# Patient Record
Sex: Female | Born: 1995 | Race: White | Hispanic: No | Marital: Single | Smoking: Never smoker
Health system: Southern US, Community
[De-identification: ages and names within clinical notes are randomized; demographics above are authoritative.]

## PROBLEM LIST (undated history)

## (undated) DIAGNOSIS — R011 Cardiac murmur, unspecified: Secondary | ICD-10-CM

## (undated) DIAGNOSIS — L0591 Pilonidal cyst without abscess: Secondary | ICD-10-CM

## (undated) DIAGNOSIS — R109 Unspecified abdominal pain: Secondary | ICD-10-CM

## (undated) DIAGNOSIS — G8929 Other chronic pain: Secondary | ICD-10-CM

## (undated) DIAGNOSIS — N938 Other specified abnormal uterine and vaginal bleeding: Secondary | ICD-10-CM

## (undated) DIAGNOSIS — D649 Anemia, unspecified: Secondary | ICD-10-CM

## (undated) HISTORY — PX: WISDOM TOOTH EXTRACTION: SHX21

## (undated) HISTORY — PX: APPENDECTOMY: SHX54

## (undated) HISTORY — PX: CHOLECYSTECTOMY: SHX55

## (undated) HISTORY — DX: Pilonidal cyst without abscess: L05.91

---
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
## 2016-01-21 ENCOUNTER — Ambulatory Visit (HOSPITAL_COMMUNITY)
Admission: RE | Admit: 2016-01-21 | Discharge: 2016-01-21 | Disposition: A | Discharge status: Home / Self Care | Payer: Medicaid Other | Source: Ambulatory Visit | Attending: Family Medicine | Admitting: Family Medicine

## 2016-01-21 ENCOUNTER — Other Ambulatory Visit (HOSPITAL_COMMUNITY): Payer: Self-pay | Admitting: Family Medicine

## 2016-01-21 DIAGNOSIS — M5410 Radiculopathy, site unspecified: Secondary | ICD-10-CM | POA: Diagnosis not present

## 2016-01-21 DIAGNOSIS — Z68.41 Body mass index (BMI) pediatric, less than 5th percentile for age: Secondary | ICD-10-CM | POA: Insufficient documentation

## 2016-01-21 DIAGNOSIS — Z1389 Encounter for screening for other disorder: Secondary | ICD-10-CM | POA: Diagnosis not present

## 2016-01-21 DIAGNOSIS — M545 Low back pain: Secondary | ICD-10-CM

## 2016-01-31 ENCOUNTER — Other Ambulatory Visit: Payer: Self-pay | Admitting: Obstetrics and Gynecology

## 2016-01-31 DIAGNOSIS — O3680X0 Pregnancy with inconclusive fetal viability, not applicable or unspecified: Secondary | ICD-10-CM

## 2016-02-04 ENCOUNTER — Other Ambulatory Visit: Payer: Medicaid Other

## 2016-02-04 ENCOUNTER — Ambulatory Visit (INDEPENDENT_AMBULATORY_CARE_PROVIDER_SITE_OTHER): Payer: Medicaid Other

## 2016-02-04 DIAGNOSIS — Z3A08 8 weeks gestation of pregnancy: Secondary | ICD-10-CM

## 2016-02-04 DIAGNOSIS — O3680X0 Pregnancy with inconclusive fetal viability, not applicable or unspecified: Secondary | ICD-10-CM

## 2016-02-04 NOTE — Progress Notes (Unsigned)
[place] 7+2wks,single IUP w/ys,pos FHT 128 bpm,normal ov's bilat,crl 9.35mm

## 2016-02-14 ENCOUNTER — Encounter: Payer: Self-pay | Admitting: Advanced Practice Midwife

## 2016-02-14 ENCOUNTER — Ambulatory Visit (INDEPENDENT_AMBULATORY_CARE_PROVIDER_SITE_OTHER): Payer: Medicaid Other | Admitting: Advanced Practice Midwife

## 2016-02-14 VITALS — BP 120/70 | HR 104 | Wt 199.0 lb

## 2016-02-14 DIAGNOSIS — Z3491 Encounter for supervision of normal pregnancy, unspecified, first trimester: Secondary | ICD-10-CM | POA: Diagnosis not present

## 2016-02-14 DIAGNOSIS — Z1389 Encounter for screening for other disorder: Secondary | ICD-10-CM

## 2016-02-14 DIAGNOSIS — Z3400 Encounter for supervision of normal first pregnancy, unspecified trimester: Secondary | ICD-10-CM | POA: Insufficient documentation

## 2016-02-14 DIAGNOSIS — Z331 Pregnant state, incidental: Secondary | ICD-10-CM

## 2016-02-14 DIAGNOSIS — Z369 Encounter for antenatal screening, unspecified: Secondary | ICD-10-CM

## 2016-02-14 DIAGNOSIS — Z3682 Encounter for antenatal screening for nuchal translucency: Secondary | ICD-10-CM

## 2016-02-14 LAB — POCT URINALYSIS DIPSTICK
Blood, UA: NEGATIVE
Glucose, UA: NEGATIVE
Ketones, UA: NEGATIVE
Leukocytes, UA: NEGATIVE
NITRITE UA: NEGATIVE
Protein, UA: NEGATIVE

## 2016-02-14 NOTE — Progress Notes (Signed)
Pt states that she has noticed some lite spotting here and there. Pt given CCNC form and lab consents to read over and sign.

## 2016-02-14 NOTE — Patient Instructions (Signed)
 First Trimester of Pregnancy The first trimester of pregnancy is from week 1 until the end of week 12 (months 1 through 3). A week after a sperm fertilizes an egg, the egg will implant on the wall of the uterus. This embryo will begin to develop into a baby. Genes from you and your partner are forming the baby. The female genes determine whether the baby is a boy or a girl. At 6-8 weeks, the eyes and face are formed, and the heartbeat can be seen on ultrasound. At the end of 12 weeks, all the baby's organs are formed.  Now that you are pregnant, you will want to do everything you can to have a healthy baby. Two of the most important things are to get good prenatal care and to follow your health care provider's instructions. Prenatal care is all the medical care you receive before the baby's birth. This care will help prevent, find, and treat any problems during the pregnancy and childbirth. BODY CHANGES Your body goes through many changes during pregnancy. The changes vary from woman to woman.   You may gain or lose a couple of pounds at first.  You may feel sick to your stomach (nauseous) and throw up (vomit). If the vomiting is uncontrollable, call your health care provider.  You may tire easily.  You may develop headaches that can be relieved by medicines approved by your health care provider.  You may urinate more often. Painful urination may mean you have a bladder infection.  You may develop heartburn as a result of your pregnancy.  You may develop constipation because certain hormones are causing the muscles that push waste through your intestines to slow down.  You may develop hemorrhoids or swollen, bulging veins (varicose veins).  Your breasts may begin to grow larger and become tender. Your nipples may stick out more, and the tissue that surrounds them (areola) may become darker.  Your gums may bleed and may be sensitive to brushing and flossing.  Dark spots or blotches  (chloasma, mask of pregnancy) may develop on your face. This will likely fade after the baby is born.  Your menstrual periods will stop.  You may have a loss of appetite.  You may develop cravings for certain kinds of food.  You may have changes in your emotions from day to day, such as being excited to be pregnant or being concerned that something may go wrong with the pregnancy and baby.  You may have more vivid and strange dreams.  You may have changes in your hair. These can include thickening of your hair, rapid growth, and changes in texture. Some women also have hair loss during or after pregnancy, or hair that feels dry or thin. Your hair will most likely return to normal after your baby is born. WHAT TO EXPECT AT YOUR PRENATAL VISITS During a routine prenatal visit:  You will be weighed to make sure you and the baby are growing normally.  Your blood pressure will be taken.  Your abdomen will be measured to track your baby's growth.  The fetal heartbeat will be listened to starting around week 10 or 12 of your pregnancy.  Test results from any previous visits will be discussed. Your health care provider may ask you:  How you are feeling.  If you are feeling the baby move.  If you have had any abnormal symptoms, such as leaking fluid, bleeding, severe headaches, or abdominal cramping.  If you have any questions. Other   tests that may be performed during your first trimester include:  Blood tests to find your blood type and to check for the presence of any previous infections. They will also be used to check for low iron levels (anemia) and Rh antibodies. Later in the pregnancy, blood tests for diabetes will be done along with other tests if problems develop.  Urine tests to check for infections, diabetes, or protein in the urine.  An ultrasound to confirm the proper growth and development of the baby.  An amniocentesis to check for possible genetic problems.  Fetal  screens for spina bifida and Down syndrome.  You may need other tests to make sure you and the baby are doing well. HOME CARE INSTRUCTIONS  Medicines  Follow your health care provider's instructions regarding medicine use. Specific medicines may be either safe or unsafe to take during pregnancy.  Take your prenatal vitamins as directed.  If you develop constipation, try taking a stool softener if your health care provider approves. Diet  Eat regular, well-balanced meals. Choose a variety of foods, such as meat or vegetable-based protein, fish, milk and low-fat dairy products, vegetables, fruits, and whole grain breads and cereals. Your health care provider will help you determine the amount of weight gain that is right for you.  Avoid raw meat and uncooked cheese. These carry germs that can cause birth defects in the baby.  Eating four or five small meals rather than three large meals a day may help relieve nausea and vomiting. If you start to feel nauseous, eating a few soda crackers can be helpful. Drinking liquids between meals instead of during meals also seems to help nausea and vomiting.  If you develop constipation, eat more high-fiber foods, such as fresh vegetables or fruit and whole grains. Drink enough fluids to keep your urine clear or pale yellow. Activity and Exercise  Exercise only as directed by your health care provider. Exercising will help you:  Control your weight.  Stay in shape.  Be prepared for labor and delivery.  Experiencing pain or cramping in the lower abdomen or low back is a good sign that you should stop exercising. Check with your health care provider before continuing normal exercises.  Try to avoid standing for long periods of time. Move your legs often if you must stand in one place for a long time.  Avoid heavy lifting.  Wear low-heeled shoes, and practice good posture.  You may continue to have sex unless your health care provider directs you  otherwise. Relief of Pain or Discomfort  Wear a good support bra for breast tenderness.   Take warm sitz baths to soothe any pain or discomfort caused by hemorrhoids. Use hemorrhoid cream if your health care provider approves.   Rest with your legs elevated if you have leg cramps or low back pain.  If you develop varicose veins in your legs, wear support hose. Elevate your feet for 15 minutes, 3-4 times a day. Limit salt in your diet. Prenatal Care  Schedule your prenatal visits by the twelfth week of pregnancy. They are usually scheduled monthly at first, then more often in the last 2 months before delivery.  Write down your questions. Take them to your prenatal visits.  Keep all your prenatal visits as directed by your health care provider. Safety  Wear your seat belt at all times when driving.  Make a list of emergency phone numbers, including numbers for family, friends, the hospital, and police and fire departments. General   Tips  Ask your health care provider for a referral to a local prenatal education class. Begin classes no later than at the beginning of month 6 of your pregnancy.  Ask for help if you have counseling or nutritional needs during pregnancy. Your health care provider can offer advice or refer you to specialists for help with various needs.  Do not use hot tubs, steam rooms, or saunas.  Do not douche or use tampons or scented sanitary pads.  Do not cross your legs for long periods of time.  Avoid cat litter boxes and soil used by cats. These carry germs that can cause birth defects in the baby and possibly loss of the fetus by miscarriage or stillbirth.  Avoid all smoking, herbs, alcohol, and medicines not prescribed by your health care provider. Chemicals in these affect the formation and growth of the baby.  Schedule a dentist appointment. At home, brush your teeth with a soft toothbrush and be gentle when you floss. SEEK MEDICAL CARE IF:   You have  dizziness.  You have mild pelvic cramps, pelvic pressure, or nagging pain in the abdominal area.  You have persistent nausea, vomiting, or diarrhea.  You have a bad smelling vaginal discharge.  You have pain with urination.  You notice increased swelling in your face, hands, legs, or ankles. SEEK IMMEDIATE MEDICAL CARE IF:   You have a fever.  You are leaking fluid from your vagina.  You have spotting or bleeding from your vagina.  You have severe abdominal cramping or pain.  You have rapid weight gain or loss.  You vomit blood or material that looks like coffee grounds.  You are exposed to German measles and have never had them.  You are exposed to fifth disease or chickenpox.  You develop a severe headache.  You have shortness of breath.  You have any kind of trauma, such as from a fall or a car accident. Document Released: 11/18/2001 Document Revised: 04/10/2014 Document Reviewed: 10/04/2013 [employer] Patient Information 2015 [employer]. This information is not intended to replace advice given to you by your health care provider. Make sure you discuss any questions you have with your health care provider.   Nausea & Vomiting  Have saltine crackers or pretzels by your bed and eat a few bites before you raise your head out of bed in the morning  Eat small frequent meals throughout the day instead of large meals  Drink plenty of fluids throughout the day to stay hydrated, just don't drink a lot of fluids with your meals.  This can make your stomach fill up faster making you feel sick  Do not brush your teeth right after you eat  Products with real ginger are good for nausea, like ginger ale and ginger hard candy Make sure it says made with real ginger!  Sucking on sour candy like lemon heads is also good for nausea  If your prenatal vitamins make you nauseated, take them at night so you will sleep through the nausea  Sea Bands  If you feel like you need  medicine for the nausea & vomiting please let us know  If you are unable to keep any fluids or food down please let us know   Constipation  Drink plenty of fluid, preferably water, throughout the day  Eat foods high in fiber such as fruits, vegetables, and grains  Exercise, such as walking, is a good way to keep your bowels regular  Drink warm fluids, especially warm   prune juice, or decaf coffee  Eat a 1/2 cup of real oatmeal (not instant), 1/2 cup applesauce, and 1/2-1 cup warm prune juice every day  If needed, you may take Colace (docusate sodium) stool softener once or twice a day to help keep the stool soft. If you are pregnant, wait until you are out of your first trimester (12-14 weeks of pregnancy)  If you still are having problems with constipation, you may take Miralax once daily as needed to help keep your bowels regular.  If you are pregnant, wait until you are out of your first trimester (12-14 weeks of pregnancy)  Safe Medications in Pregnancy   Acne: Benzoyl Peroxide Salicylic Acid  Backache/Headache: Tylenol: 2 regular strength every 4 hours OR              2 Extra strength every 6 hours  Colds/Coughs/Allergies: Benadryl (alcohol free) 25 mg every 6 hours as needed Breath right strips Claritin Cepacol throat lozenges Chloraseptic throat spray Cold-Eeze- up to three times per day Cough drops, alcohol free Flonase (by prescription only) Guaifenesin Mucinex Robitussin DM (plain only, alcohol free) Saline nasal spray/drops Sudafed (pseudoephedrine) & Actifed ** use only after [redacted] weeks gestation and if you do not have high blood pressure Tylenol Vicks Vaporub Zinc lozenges Zyrtec   Constipation: Colace Ducolax suppositories Fleet enema Glycerin suppositories Metamucil Milk of magnesia Miralax Senokot Smooth move tea  Diarrhea: Kaopectate Imodium A-D  *NO pepto Bismol  Hemorrhoids: Anusol Anusol HC Preparation  H Tucks  Indigestion: Tums Maalox Mylanta Zantac  Pepcid  Insomnia: Benadryl (alcohol free) 25mg every 6 hours as needed Tylenol PM Unisom, no Gelcaps  Leg Cramps: Tums MagGel  Nausea/Vomiting:  Bonine Dramamine Emetrol Ginger extract Sea bands Meclizine  Nausea medication to take during pregnancy:  Unisom (doxylamine succinate 25 mg tablets) Take one tablet daily at bedtime. If symptoms are not adequately controlled, the dose can be increased to a maximum recommended dose of two tablets daily (1/2 tablet in the morning, 1/2 tablet mid-afternoon and one at bedtime). Vitamin B6 100mg tablets. Take one tablet twice a day (up to 200 mg per day).  Skin Rashes: Aveeno products Benadryl cream or 25mg every 6 hours as needed Calamine Lotion 1% cortisone cream  Yeast infection: Gyne-lotrimin 7 Monistat 7   **If taking multiple medications, please check labels to avoid duplicating the same active ingredients **take medication as directed on the label ** Do not exceed 4000 mg of tylenol in 24 hours **Do not take medications that contain aspirin or ibuprofen      

## 2016-02-14 NOTE — Progress Notes (Signed)
  Subjective:    Bailey Palmer is a G1P0 327w5d being seen today for her first obstetrical visit.  Her obstetrical history is significant for first pregnancy.  Pregnancy history fully reviewed.  Patient reports nausea.  Filed Vitals:   02/14/16 1519  BP: 120/70  Pulse: 104  Weight: 199 lb (90.266 kg)    HISTORY: OB History  Gravida Para Term Preterm AB SAB TAB Ectopic Multiple Living  1             # Outcome Date GA Lbr Len/2nd Weight Sex Delivery Anes PTL Lv  1 Current              Past Medical History  Diagnosis Date  . DUB (dysfunctional uterine bleeding)   . Chronic abdominal pain    Past Surgical History  Procedure Laterality Date  . Appendectomy    . Cholecystectomy     Family History  Problem Relation Age of Onset  . ADD / ADHD Brother   . Diabetes Maternal Grandmother   . Hypertension Maternal Grandmother      Exam                                      System:     Skin: normal coloration and turgor, no rashes    Neurologic: oriented, normal, normal mood   Extremities: normal strength, tone, and muscle mass   HEENT PERRLA   Mouth/Teeth mucous membranes moist, normal dentition   Neck supple and no masses   Cardiovascular: regular rate and rhythm   Respiratory:  appears well, vitals normal, no respiratory distress, acyanotic   Abdomen: soft, non-tender;  FHR: 160 US          Assessment:    Pregnancy: G1P0 Patient Active Problem List   Diagnosis Date Noted  . Supervision of normal pregnancy 02/14/2016        Plan:     Initial labs drawn. Continue prenatal vitamins  Problem list reviewed and updated  Reviewed n/v relief measures and warning s/s to report  Reviewed recommended weight gain based on pre-gravid BMI  Encouraged well-balanced diet Genetic Screening discussed Integrated Screen: requested.  Ultrasound discussed; fetal survey: requested.  Return in about 4 weeks (around 03/13/2016) for LROB, US:NT+1st  IT.  CRESENZO-DISHMAN,Oceanna Arruda 02/14/2016

## 2016-02-16 LAB — GC/CHLAMYDIA PROBE AMP
CHLAMYDIA, DNA PROBE: POSITIVE — AB
Neisseria gonorrhoeae by PCR: NEGATIVE

## 2016-02-16 LAB — URINE CULTURE

## 2016-02-19 LAB — PMP SCREEN PROFILE (10S), URINE
Amphetamine Screen, Ur: NEGATIVE ng/mL
BARBITURATE SCRN UR: NEGATIVE ng/mL
Benzodiazepine Screen, Urine: NEGATIVE ng/mL
CANNABINOIDS UR QL SCN: NEGATIVE ng/mL
Cocaine(Metab.)Screen, Urine: NEGATIVE ng/mL
Creatinine(Crt), U: 151 mg/dL (ref 20.0–300.0)
Methadone Scn, Ur: NEGATIVE ng/mL
Opiate Scrn, Ur: NEGATIVE ng/mL
Oxycodone+Oxymorphone Ur Ql Scn: NEGATIVE ng/mL
PCP Scrn, Ur: NEGATIVE ng/mL
PH UR, DRUG SCRN: 6.6 (ref 4.5–8.9)
Propoxyphene, Screen: NEGATIVE ng/mL

## 2016-02-19 LAB — ANTIBODY SCREEN: ANTIBODY SCREEN: NEGATIVE

## 2016-02-19 LAB — URINALYSIS, ROUTINE W REFLEX MICROSCOPIC
Bilirubin, UA: NEGATIVE
Glucose, UA: NEGATIVE
Ketones, UA: NEGATIVE
Leukocytes, UA: NEGATIVE
NITRITE UA: NEGATIVE
PH UA: 6.5 (ref 5.0–7.5)
RBC UA: NEGATIVE
Specific Gravity, UA: >=1.03 — AB (ref 1.005–1.030)
UUROB: 2 mg/dL — AB (ref 0.2–1.0)

## 2016-02-19 LAB — CBC
HEMOGLOBIN: 11.8 g/dL (ref 11.1–15.9)
Hematocrit: 35.1 % (ref 34.0–46.6)
MCH: 28.2 pg (ref 26.6–33.0)
MCHC: 33.6 g/dL (ref 31.5–35.7)
MCV: 84 fL (ref 79–97)
Platelets: 317 10*3/uL (ref 150–379)
RBC: 4.18 x10E6/uL (ref 3.77–5.28)
RDW: 14.1 % (ref 12.3–15.4)
WBC: 9.7 10*3/uL (ref 3.4–10.8)

## 2016-02-19 LAB — ABO/RH: Rh Factor: POSITIVE

## 2016-02-19 LAB — VARICELLA ZOSTER ANTIBODY, IGG: VARICELLA: 377 {index} (ref >165)

## 2016-02-19 LAB — CYSTIC FIBROSIS MUTATION 97: Interpretation: NOT DETECTED

## 2016-02-19 LAB — HIV ANTIBODY (ROUTINE TESTING W REFLEX): HIV Screen 4th Generation wRfx: NONREACTIVE

## 2016-02-19 LAB — HEPATITIS B SURFACE ANTIGEN: Hepatitis B Surface Ag: NEGATIVE

## 2016-02-19 LAB — RPR: RPR Ser Ql: NONREACTIVE

## 2016-02-19 LAB — RUBELLA SCREEN: RUBELLA: 1.23 {index} (ref >0.99)

## 2016-02-19 LAB — SICKLE CELL SCREEN: Sickle Cell Screen: NEGATIVE

## 2016-02-20 ENCOUNTER — Other Ambulatory Visit: Payer: Self-pay | Admitting: Advanced Practice Midwife

## 2016-02-20 DIAGNOSIS — A749 Chlamydial infection, unspecified: Secondary | ICD-10-CM | POA: Insufficient documentation

## 2016-02-20 DIAGNOSIS — Z3491 Encounter for supervision of normal pregnancy, unspecified, first trimester: Secondary | ICD-10-CM

## 2016-02-20 MED ORDER — AZITHROMYCIN 500 MG PO TABS: 1000.0000 mg | ORAL_TABLET | Freq: Once | ORAL | Status: DC

## 2016-03-13 ENCOUNTER — Other Ambulatory Visit: Payer: Self-pay

## 2016-03-13 ENCOUNTER — Encounter: Payer: Self-pay | Admitting: Advanced Practice Midwife

## 2016-03-17 ENCOUNTER — Ambulatory Visit (INDEPENDENT_AMBULATORY_CARE_PROVIDER_SITE_OTHER): Payer: Medicaid Other | Admitting: Obstetrics and Gynecology

## 2016-03-17 ENCOUNTER — Encounter: Payer: Self-pay | Admitting: Obstetrics and Gynecology

## 2016-03-17 ENCOUNTER — Ambulatory Visit (INDEPENDENT_AMBULATORY_CARE_PROVIDER_SITE_OTHER): Payer: Medicaid Other

## 2016-03-17 VITALS — BP 120/72 | HR 96 | Wt 199.0 lb

## 2016-03-17 DIAGNOSIS — Z3401 Encounter for supervision of normal first pregnancy, first trimester: Secondary | ICD-10-CM

## 2016-03-17 DIAGNOSIS — Z3A14 14 weeks gestation of pregnancy: Secondary | ICD-10-CM | POA: Diagnosis not present

## 2016-03-17 DIAGNOSIS — Z1389 Encounter for screening for other disorder: Secondary | ICD-10-CM

## 2016-03-17 DIAGNOSIS — Z36 Encounter for antenatal screening of mother: Secondary | ICD-10-CM | POA: Diagnosis not present

## 2016-03-17 DIAGNOSIS — Z369 Encounter for antenatal screening, unspecified: Secondary | ICD-10-CM

## 2016-03-17 DIAGNOSIS — Z331 Pregnant state, incidental: Secondary | ICD-10-CM

## 2016-03-17 DIAGNOSIS — Z3491 Encounter for supervision of normal pregnancy, unspecified, first trimester: Secondary | ICD-10-CM

## 2016-03-17 DIAGNOSIS — A749 Chlamydial infection, unspecified: Secondary | ICD-10-CM

## 2016-03-17 DIAGNOSIS — Z3682 Encounter for antenatal screening for nuchal translucency: Secondary | ICD-10-CM

## 2016-03-17 DIAGNOSIS — O98311 Other infections with a predominantly sexual mode of transmission complicating pregnancy, first trimester: Secondary | ICD-10-CM

## 2016-03-17 LAB — POCT URINALYSIS DIPSTICK
Blood, UA: NEGATIVE
GLUCOSE UA: NEGATIVE
Ketones, UA: NEGATIVE
LEUKOCYTES UA: NEGATIVE
Nitrite, UA: NEGATIVE
Protein, UA: NEGATIVE

## 2016-03-17 NOTE — Progress Notes (Signed)
Pt denies any problems or concerns at this time.  

## 2016-03-17 NOTE — Progress Notes (Signed)
Patient ID: Bailey Palmer, female   DOB: [birth year], 20 y.o.   MRN: [id number] G1P0 348w2d Estimated Date of Delivery: 09/20/16  Blood pressure 120/72, pulse 96, weight 199 lb (90.266 kg), last menstrual period 12/15/2015.   refer to the ob flow sheet for FH and FHR, also BP, Wt, Urine results: negative  GC/Chlamydia Probe completed during visit. Also advised pt and partner to attend child birth classes.  FHR: 162  Patient reports  + good fetal movement, denies any bleeding and no rupture of membranes symptoms or regular contractions. Patient complaints: Pt has no complaints today.   Questions were answered. Assessment:  1. LROB G1P0 @ 518w2d  2. Proof of cure for chlamydia collected   Plan:   1. Continued routine obstetrical care 2. Child birth classes discussed 3. F/u in 4 weeks for routine OB care.  4 initial labs for IT testing today.   By signing my name below, I, Marica Otterusrat Rahman, attest that this documentation has been prepared under the direction and in the presence of Christin BachJohn Fredricka Kohrs, MD. Electronically Signed: Marica OtterNusrat Rahman, ED Scribe. 03/17/2016. 3:57 PM.   I personally performed the services described in this documentation, which was SCRIBED in my presence. The recorded information has been reviewed and considered accurate. It has been edited as necessary during review. Tilda BurrowFERGUSON,Kolbie Lepkowski V, MD

## 2016-03-17 NOTE — Progress Notes (Signed)
[place] 13+2 wks,crl 71.7 mm,normal ov's bilat,NB present,NT 1.746mm,fht 160 bpm

## 2016-03-18 LAB — GC/CHLAMYDIA PROBE AMP
Chlamydia trachomatis, NAA: NEGATIVE
NEISSERIA GONORRHOEAE BY PCR: NEGATIVE

## 2016-03-19 LAB — MATERNAL SCREEN, INTEGRATED #1
Crown Rump Length: 71.7 mm
GEST. AGE ON COLLECTION DATE: 13 wk
Maternal Age at EDD: 19.7 years
NUCHAL TRANSLUCENCY (NT): 1.6 mm
NUMBER OF FETUSES: 1
PAPP-A VALUE: 752.4 ng/mL
WEIGHT: 199 [lb_av]

## 2016-04-02 ENCOUNTER — Ambulatory Visit (INDEPENDENT_AMBULATORY_CARE_PROVIDER_SITE_OTHER): Payer: Medicaid Other | Admitting: Advanced Practice Midwife

## 2016-04-02 VITALS — BP 108/50 | HR 98 | Wt 197.0 lb

## 2016-04-02 DIAGNOSIS — Z1389 Encounter for screening for other disorder: Secondary | ICD-10-CM

## 2016-04-02 DIAGNOSIS — O2332 Infections of other parts of urinary tract in pregnancy, second trimester: Secondary | ICD-10-CM

## 2016-04-02 DIAGNOSIS — Z3492 Encounter for supervision of normal pregnancy, unspecified, second trimester: Secondary | ICD-10-CM

## 2016-04-02 DIAGNOSIS — Z3402 Encounter for supervision of normal first pregnancy, second trimester: Secondary | ICD-10-CM

## 2016-04-02 DIAGNOSIS — Z3A16 16 weeks gestation of pregnancy: Secondary | ICD-10-CM

## 2016-04-02 DIAGNOSIS — N390 Urinary tract infection, site not specified: Secondary | ICD-10-CM

## 2016-04-02 DIAGNOSIS — Z331 Pregnant state, incidental: Secondary | ICD-10-CM

## 2016-04-02 DIAGNOSIS — R319 Hematuria, unspecified: Secondary | ICD-10-CM

## 2016-04-02 DIAGNOSIS — A749 Chlamydial infection, unspecified: Secondary | ICD-10-CM

## 2016-04-02 LAB — POCT URINALYSIS DIPSTICK
Blood, UA: 3
GLUCOSE UA: NEGATIVE
Ketones, UA: NEGATIVE
Nitrite, UA: NEGATIVE
Protein, UA: 1

## 2016-04-02 MED ORDER — NITROFURANTOIN MONOHYD MACRO 100 MG PO CAPS: 100.0000 mg | ORAL_CAPSULE | Freq: Two times a day (BID) | ORAL | Status: AC

## 2016-04-02 NOTE — Progress Notes (Signed)
WORK IN FOR:  ? UTI:  C/o urinary frequency and dysuria since yesterday.   G1P0 7475w4d Estimated Date of Delivery: 09/20/16  Blood pressure 108/50, pulse 98, weight 197 lb (89.359 kg), last menstrual period 12/15/2015.   BP weight and urine results all reviewed and noted.  Please refer to the obstetrical flow sheet for the fundal height and fetal heart rate documentation:  Patient denies any bleeding and no rupture of membranes symptoms or regular contractions. No fever or CVAT All questions were answered.  Orders Placed This Encounter  Procedures  . Urine culture  . POCT urinalysis dipstick    Plan:  Continued routine obstetrical care, pyelo precautions given. Rx Macrobid 100mg  BID X7  Return for As scheduled.

## 2016-04-04 LAB — URINE CULTURE

## 2016-04-08 ENCOUNTER — Other Ambulatory Visit: Payer: Self-pay | Admitting: Advanced Practice Midwife

## 2016-04-14 ENCOUNTER — Encounter: Payer: Self-pay | Admitting: Women's Health

## 2016-04-14 ENCOUNTER — Ambulatory Visit (INDEPENDENT_AMBULATORY_CARE_PROVIDER_SITE_OTHER): Payer: Medicaid Other | Admitting: Women's Health

## 2016-04-14 VITALS — BP 122/58 | HR 88 | Wt 202.0 lb

## 2016-04-14 DIAGNOSIS — Z3402 Encounter for supervision of normal first pregnancy, second trimester: Secondary | ICD-10-CM

## 2016-04-14 DIAGNOSIS — Z363 Encounter for antenatal screening for malformations: Secondary | ICD-10-CM

## 2016-04-14 DIAGNOSIS — Z331 Pregnant state, incidental: Secondary | ICD-10-CM

## 2016-04-14 DIAGNOSIS — Z1389 Encounter for screening for other disorder: Secondary | ICD-10-CM

## 2016-04-14 DIAGNOSIS — Z3682 Encounter for antenatal screening for nuchal translucency: Secondary | ICD-10-CM

## 2016-04-14 LAB — POCT URINALYSIS DIPSTICK
Blood, UA: NEGATIVE
GLUCOSE UA: NEGATIVE
Ketones, UA: NEGATIVE
Leukocytes, UA: NEGATIVE
Nitrite, UA: NEGATIVE
Protein, UA: NEGATIVE

## 2016-04-14 NOTE — Progress Notes (Signed)
Low-risk OB appointment G1P0 5071w2d Estimated Date of Delivery: 09/20/16 BP 122/58 mmHg  Pulse 88  Wt 202 lb (91.627 kg)  LMP 12/15/2015 (Exact Date)  BP, weight, and urine reviewed.  Refer to obstetrical flow sheet for FH & FHR.  No fm yet. Denies cramping, lof, vb, or uti s/s. No complaints. Reviewed warning s/s to report. Plan:  Continue routine obstetrical care  F/U in 2wks for OB appointment and anatomy u/s 2nd IT today

## 2016-04-14 NOTE — Patient Instructions (Signed)

## 2016-04-16 LAB — MATERNAL SCREEN, INTEGRATED #2
ADSF: 1.12
AFP MoM: 1.13
Alpha-Fetoprotein: 32.2 ng/mL
Crown Rump Length: 71.7 mm
DIA MoM: 0.87
DIA VALUE: 127.7 pg/mL
ESTRIOL UNCONJUGATED: 1.04 ng/mL
GEST. AGE ON COLLECTION DATE: 13 wk
Gestational Age: 17 weeks
MATERNAL AGE AT EDD: 19.7 a
NUCHAL TRANSLUCENCY (NT): 1.6 mm
NUCHAL TRANSLUCENCY MOM: 0.91
Number of Fetuses: 1
PAPP-A MoM: 0.93
PAPP-A VALUE: 752.4 ng/mL
Test Results:: NEGATIVE
WEIGHT: 199 [lb_av]
Weight: 199 [lb_av]
hCG MoM: 1.23
hCG Value: 29.6 IU/mL

## 2016-04-28 ENCOUNTER — Ambulatory Visit (INDEPENDENT_AMBULATORY_CARE_PROVIDER_SITE_OTHER): Payer: Medicaid Other | Admitting: Women's Health

## 2016-04-28 ENCOUNTER — Encounter: Payer: Self-pay | Admitting: Women's Health

## 2016-04-28 ENCOUNTER — Ambulatory Visit (INDEPENDENT_AMBULATORY_CARE_PROVIDER_SITE_OTHER): Payer: Medicaid Other

## 2016-04-28 VITALS — BP 128/70 | HR 96 | Wt 201.0 lb

## 2016-04-28 DIAGNOSIS — Z3A20 20 weeks gestation of pregnancy: Secondary | ICD-10-CM

## 2016-04-28 DIAGNOSIS — Z363 Encounter for antenatal screening for malformations: Secondary | ICD-10-CM

## 2016-04-28 DIAGNOSIS — Z1389 Encounter for screening for other disorder: Secondary | ICD-10-CM

## 2016-04-28 DIAGNOSIS — Z3402 Encounter for supervision of normal first pregnancy, second trimester: Secondary | ICD-10-CM

## 2016-04-28 DIAGNOSIS — Z36 Encounter for antenatal screening of mother: Secondary | ICD-10-CM | POA: Diagnosis not present

## 2016-04-28 DIAGNOSIS — Z331 Pregnant state, incidental: Secondary | ICD-10-CM

## 2016-04-28 DIAGNOSIS — A749 Chlamydial infection, unspecified: Secondary | ICD-10-CM

## 2016-04-28 LAB — POCT URINALYSIS DIPSTICK
Blood, UA: NEGATIVE
KETONES UA: NEGATIVE
Leukocytes, UA: NEGATIVE
Nitrite, UA: NEGATIVE
Protein, UA: NEGATIVE

## 2016-04-28 NOTE — Progress Notes (Signed)
Low-risk OB appointment G1P0 6991w2d Estimated Date of Delivery: 09/20/16 BP 128/70 mmHg  Pulse 96  Wt 201 lb (91.173 kg)  LMP 12/15/2015 (Exact Date)  BP, weight, and urine reviewed.  Refer to obstetrical flow sheet for FH & FHR.  Reports good fm.  Denies regular uc's, lof, vb, or uti s/s. Cold/allergies- gave printed relief measures, let us know if worsening/not improving.  Reviewed today's normal anatomy u/s, warning s/s to report. Plan:  Continue routine obstetrical care  F/U in 4wks for OB appointment

## 2016-04-28 NOTE — Progress Notes (Signed)
US 19+2 wks,cephalic,ant pl gr 0,normal ov's bilat, cx 3.4 cm,svp of fluid 5.3 cm,fhr 150 bpm,efw 291 g,anatomy complete,no obvious abnormalities seen

## 2016-04-28 NOTE — Patient Instructions (Signed)
You have a viral infection that will resolve on its own over time.  Symptoms typically last 3-7 days but can stretch out to 2-3 weeks.  Unfortunately, antibiotics are not helpful for viral infections.  Humidifier and saline nasal spray for nasal congestion  Regular robitussin, cough drops for cough  Warm salt water gargles for sore throat  Mucinex with lots of water to help you cough up the mucous in your chest if needed  Drink plenty of fluids and stay hydrated!  Wash your hands frequently.  Call if you are not improving by 7-10 days.    Safe Medications in Pregnancy   Acne: Benzoyl Peroxide Salicylic Acid  Backache/Headache: Tylenol: 2 regular strength every 4 hours OR              2 Extra strength every 6 hours  Colds/Coughs/Allergies: Benadryl (alcohol free) 25 mg every 6 hours as needed Breath right strips Claritin Cepacol throat lozenges Chloraseptic throat spray Cold-Eeze- up to three times per day Cough drops, alcohol free Flonase (by prescription only) Guaifenesin Mucinex Robitussin DM (plain only, alcohol free) Saline nasal spray/drops Sudafed (pseudoephedrine) & Actifed ** use only after [redacted] weeks gestation and if you do not have high blood pressure Tylenol Vicks Vaporub Zinc lozenges Zyrtec   Constipation: Colace Ducolax suppositories Fleet enema Glycerin suppositories Metamucil Milk of magnesia Miralax Senokot Smooth move tea  Diarrhe igestion: Tums Maalox Mylanta Zantac  Pepcid  Insomnia: Benadryl (alcohol free) 25mg  every 6 hours as needed Tylenol PM Unisom, no Gelcaps  Leg Cramps: Tums MagGel  Nausea/Vomiting:  Bonine Dramamine Emetrol Ginger extract Sea bands Meclizine  Nausea medication to take during pregnancy:  Unisom (doxylamine succinate 25 mg tablets) Take one tablet daily at bedtime. If symptoms are not  adequately controlled, the dose can be increased to a maximum recommended dose of two tablets daily (1/2 tablet in the morning, 1/2 tablet mid-afternoon and one at bedtime). Vitamin B6  tablets. Take one tablet twice a day (up to 200 mg per day).  Skin Rashes: Aveeno products Benadryl cream or  every 6 hours as needed Calamine Lotion 1% Hydrocortisone cream  Yeast infection: Gyne-lotrimin 7 Monistat 7   **If taking multiple medications, please check labels to avoid duplicating the same active ingredients **take medication as directed on the label ** Do not exceed 4000 mg of tylenol in 24 hours **Do not take medications that contain aspirin or ibuprofen  Second Trimester of Pregnancy The second trimester is from week 13 through week 28, months 4 through 6. The second trimester is often a time when you feel your best. Your body has also adjusted to being pregnant, and you begin to feel better physically. Usually, morning sickness has lessened or quit completely, you may have more energy, and you may have an increase in appetite. The second trimester is also a time when the fetus is growing rapidly. At the end of the sixth month, the fetus is about 9 inches long and weighs about 1 pounds. You will likely begin to feel the baby move (quickening) between 18 and 20 weeks of the pregnancy. BODY CHANGES Your body goes through many changes during pregnancy. The changes vary from woman to woman.   Your weight will continue to increase. You will notice your lower abdomen bulging out.  You may begin to get stretch marks on your hips, abdomen, and breasts.  You may develop headaches  that can be relieved by medicines approved by your health care provider.  You may urinate more often because the fetus is pressing on your bladder.  You may develop or continue to have heartburn as a result of your pregnancy.  You may develop constipation because certain hormones are causing the muscles  that push waste through your intestines to slow down.  You may develop hemorrhoids or swollen, bulging veins (varicose veins).  You may have back pain because of the weight gain and pregnancy hormones relaxing your joints between the bones in your pelvis and as a result of a shift in weight and the muscles that support your balance.  Your breasts will continue to grow and be tender.  Your gums may bleed and may be sensitive to brushing and flossing.  Dark spots or blotches (chloasma, mask of pregnancy) may develop on your face. This will likely fade after the baby is born.  A dark line from your belly button to the pubic area (linea nigra) may appear. This will likely fade after the baby is born.  You may have changes in your hair. These can include thickening of your hair, rapid growth, and changes in texture. Some women also have hair loss during or after pregnancy, or hair that feels dry or thin. Your hair will most likely return to normal after your baby is born. WHAT TO EXPECT AT YOUR PRENATAL VISITS During a routine prenatal visit:  You will be weighed to make sure you and the fetus are growing normally.  Your blood pressure will be taken.  Your abdomen will be measured to track your baby's growth.  The fetal heartbeat will be listened to.  Any test results from the previous visit will be discussed. Your health care provider may ask you:  How you are feeling.  If you are feeling the baby move.  If you have had any abnormal symptoms, such as leaking fluid, bleeding, severe headaches, or abdominal cramping.  If you are using any tobacco products, including cigarettes, chewing tobacco, and electronic cigarettes.  If you have any questions. Other tests that may be performed during your second trimester include:  Blood tests that check for:  Low iron levels (anemia).  Gestational diabetes (between 24 and 28 weeks).  Rh antibodies.  Urine tests to check for infections,  diabetes, or protein in the urine.  An ultrasound to confirm the proper growth and development of the baby.  An amniocentesis to check for possible genetic problems.  Fetal screens for spina bifida and Down syndrome.  HIV (human immunodeficiency virus) testing. Routine prenatal testing includes screening for HIV, unless you choose not to have this test. HOME CARE INSTRUCTIONS   Avoid all smoking, herbs, alcohol, and unprescribed drugs. These chemicals affect the formation and growth of the baby.  Do not use any tobacco products, including cigarettes, chewing tobacco, and electronic cigarettes. If you need help quitting, ask your health care provider. You may receive counseling support and other resources to help you quit.  Follow your health care provider's instructions regarding medicine use. There are medicines that are either safe or unsafe to take during pregnancy.  Exercise only as directed by your health care provider. Experiencing uterine cramps is a good sign to stop exercising.  Continue to eat regular, healthy meals.  Wear a good support bra for breast tenderness.  Do not use hot tubs, steam rooms, or saunas.  Wear your seat belt at all times when driving.  Avoid raw meat, uncooked  cheese, cat litter boxes, and soil used by cats. These carry germs that can cause birth defects in the baby.  Take your prenatal vitamins.  Take 1500-2000 mg of calcium daily starting at the 20th week of pregnancy until you deliver your baby.  Try taking a stool softener (if your health care provider approves) if you develop constipation. Eat more high-fiber foods, such as fresh vegetables or fruit and whole grains. Drink plenty of fluids to keep your urine clear or pale yellow.  Take warm sitz baths to soothe any pain or discomfort caused by hemorrhoids. Use hemorrhoid cream if your health care provider approves.  If you develop varicose veins, wear support hose. Elevate your feet for 15  minutes, 3-4 times a day. Limit salt in your diet.  Avoid heavy lifting, wear low heel shoes, and practice good posture.  Rest with your legs elevated if you have leg cramps or low back pain.  Visit your dentist if you have not gone yet during your pregnancy. Use a soft toothbrush to brush your teeth and be gentle when you floss.  A sexual relationship may be continued unless your health care provider directs you otherwise.  Continue to go to all your prenatal visits as directed by your health care provider. SEEK MEDICAL CARE IF:   You have dizziness.  You have mild pelvic cramps, pelvic pressure, or nagging pain in the abdominal area.  You have persistent nausea, vomiting, or diarrhea.  You have a bad smelling vaginal discharge.  You have pain with urination. SEEK IMMEDIATE MEDICAL CARE IF:   You have a fever.  You are leaking fluid from your vagina.  You have spotting or bleeding from your vagina.  You have severe abdominal cramping or pain.  You have rapid weight gain or loss.  You have shortness of breath with chest pain.  You notice sudden or extreme swelling of your face, hands, ankles, feet, or legs.  You have not felt your baby move in over an hour.  You have severe headaches that do not go away with medicine.  You have vision changes.   This information is not intended to replace advice given to you by your health care provider. Make sure you discuss any questions you have with your health care provider.   Document Released: 11/18/2001 Document Revised: 12/15/2014 Document Reviewed: 01/25/2013 Elsevier Interactive Patient Education [employer] Elsevier Inc.

## 2016-05-09 ENCOUNTER — Telehealth: Payer: Self-pay | Admitting: *Deleted

## 2016-05-09 NOTE — Telephone Encounter (Signed)
Spoke with pt's mom. Pt started with diarrhea and vomiting today. Unsure about fever. I advised it could be a stomach virus. Advised to watch for signs of dehydration, like dry mouth and not urinating much. Spoke with Dr. Emelda FearFerguson and he advised to give system 24-48 hours to flush out. Drink Gatorade and Kool Aid as tolerated per Dr. Emelda FearFerguson. Eat crackers and dry toast as tolerated. If she feels like she's getting dehydrated, call the after hours nurse line. Pt's mom voiced understanding. JSY

## 2016-05-26 ENCOUNTER — Encounter (HOSPITAL_COMMUNITY): Payer: Self-pay | Admitting: Emergency Medicine

## 2016-05-26 ENCOUNTER — Ambulatory Visit (INDEPENDENT_AMBULATORY_CARE_PROVIDER_SITE_OTHER): Payer: Medicaid Other | Admitting: Women's Health

## 2016-05-26 ENCOUNTER — Encounter: Payer: Self-pay | Admitting: Women's Health

## 2016-05-26 VITALS — BP 108/56 | HR 84 | Wt 198.0 lb

## 2016-05-26 DIAGNOSIS — Z3402 Encounter for supervision of normal first pregnancy, second trimester: Secondary | ICD-10-CM

## 2016-05-26 DIAGNOSIS — Z331 Pregnant state, incidental: Secondary | ICD-10-CM

## 2016-05-26 DIAGNOSIS — L0231 Cutaneous abscess of buttock: Secondary | ICD-10-CM | POA: Diagnosis not present

## 2016-05-26 DIAGNOSIS — Z1389 Encounter for screening for other disorder: Secondary | ICD-10-CM

## 2016-05-26 DIAGNOSIS — Z3A24 24 weeks gestation of pregnancy: Secondary | ICD-10-CM

## 2016-05-26 DIAGNOSIS — O99712 Diseases of the skin and subcutaneous tissue complicating pregnancy, second trimester: Secondary | ICD-10-CM | POA: Diagnosis present

## 2016-05-26 DIAGNOSIS — Z3A23 23 weeks gestation of pregnancy: Secondary | ICD-10-CM | POA: Insufficient documentation

## 2016-05-26 LAB — POCT URINALYSIS DIPSTICK
Blood, UA: NEGATIVE
GLUCOSE UA: NEGATIVE
KETONES UA: NEGATIVE
LEUKOCYTES UA: NEGATIVE
Nitrite, UA: NEGATIVE
Protein, UA: NEGATIVE

## 2016-05-26 NOTE — ED Notes (Addendum)
Patient complaining of abscess and pain to coccyx area starting today. States she is [redacted] weeks pregnant and "[facility] and [facility] told me since it's not pregnancy related I could come here."

## 2016-05-26 NOTE — Progress Notes (Signed)
Low-risk OB appointment [id number] Estimated Date of Delivery: 09/20/16 BP 108/56 mmHg  Pulse 84  Wt 198 lb (89.812 kg)  LMP 12/15/2015 (Exact Date)  BP, weight, and urine reviewed.  Refer to obstetrical flow sheet for FH & FHR.  Reports good fm.  Denies regular uc's, lof, vb, or uti s/s. Sacral pain since Thurs, denies trauma to area- feels like getting worse. No boils, signs of infection, entire sacral area very tender to touch- discussed w/ [facility]- to get trigger point foam roller from [employer] and begin rolling against wall then floor, recommended prenatal yoga.  Reviewed ptl s/s, fm. Plan:  Continue routine obstetrical care  F/U in 4wks for OB appointment and pn2

## 2016-05-26 NOTE — Patient Instructions (Addendum)
You will have your sugar test next visit.  Please do not eat or drink anything after midnight the night before you come, not even water.  You will be here for at least two hours.     Call the office [phone]) or go to [facility] if:  You begin to have strong, frequent contractions  Your water breaks.  Sometimes it is a big gush of fluid, sometimes it is just a trickle that keeps getting your panties wet or running down your legs  You have vaginal bleeding.  It is normal to have a small amount of spotting if your cervix was checked.   You don't feel your baby moving like normal.  If you don't, get you something to eat and drink and lay down and focus on feeling your baby move.   If your baby is still not moving like normal, you should call the office or [facility].  Trigger Point Grid Foam Roller 13'' on [employer]  For your lower back pain you may:  Purchase a pregnancy belt from [employer], [employer], [employer], etc and wear it while you are up and about  Take warm baths  Use a heating pad to your lower back for no longer than 20 minutes at a time, and do not place near abdomen  Take tylenol as needed. Please follow directions on the bottle   Second Trimester of Pregnancy The second trimester is from week 13 through week 28, months 4 through 6. The second trimester is often a time when you feel your best. Your body has also adjusted to being pregnant, and you begin to feel better physically. Usually, morning sickness has lessened or quit completely, you may have more energy, and you may have an increase in appetite. The second trimester is also a time when the fetus is growing rapidly. At the end of the sixth month, the fetus is about 9 inches long and weighs about 1 pounds. You will likely begin to feel the baby move (quickening) between 18 and 20 weeks of the pregnancy. BODY CHANGES Your body goes through many changes during pregnancy. The changes vary from  woman to woman.   Your weight will continue to increase. You will notice your lower abdomen bulging out.  You may begin to get stretch marks on your hips, abdomen, and breasts.  You may develop headaches that can be relieved by medicines approved by your health care provider.  You may urinate more often because the fetus is pressing on your bladder.  You may develop or continue to have heartburn as a result of your pregnancy.  You may develop constipation because certain hormones are causing the muscles that push waste through your intestines to slow down.  You may develop hemorrhoids or swollen, bulging veins (varicose veins).  You may have back pain because of the weight gain and pregnancy hormones relaxing your joints between the bones in your pelvis and as a result of a shift in weight and the muscles that support your balance.  Your breasts will continue to grow and be tender.  Your gums may bleed and may be sensitive to brushing and flossing.  Dark spots or blotches (chloasma, mask of pregnancy) may develop on your face. This will likely fade after the baby is born.  A dark line from your belly button to the pubic area (linea nigra) may appear. This will likely fade after the baby is born.  You may have changes in your  hair. These can include thickening of your hair, rapid growth, and changes in texture. Some women also have hair loss during or after pregnancy, or hair that feels dry or thin. Your hair will most likely return to normal after your baby is born. WHAT TO EXPECT AT YOUR PRENATAL VISITS During a routine prenatal visit:  You will be weighed to make sure you and the fetus are growing normally.  Your blood pressure will be taken.  Your abdomen will be measured to track your baby's growth.  The fetal heartbeat will be listened to.  Any test results from the previous visit will be discussed. Your health care provider may ask you:  How you are feeling.  If you  are feeling the baby move.  If you have had any abnormal symptoms, such as leaking fluid, bleeding, severe headaches, or abdominal cramping.  If you have any questions. Other tests that may be performed during your second trimester include:  Blood tests that check for:  Low iron levels (anemia).  Gestational diabetes (between 24 and 28 weeks).  Rh antibodies.  Urine tests to check for infections, diabetes, or protein in the urine.  An ultrasound to confirm the proper growth and development of the baby.  An amniocentesis to check for possible genetic problems.  Fetal screens for spina bifida and Down syndrome. HOME CARE INSTRUCTIONS  5. Avoid all smoking, herbs, alcohol, and unprescribed drugs. These chemicals affect the formation and growth of the baby. 6. Follow your health care provider's instructions regarding medicine use. There are medicines that are either safe or unsafe to take during pregnancy. 7. Exercise only as directed by your health care provider. Experiencing uterine cramps is a good sign to stop exercising. 8. Continue to eat regular, healthy meals. 9. Wear a good support bra for breast tenderness. 10. Do not use hot tubs, steam rooms, or saunas. 11. Wear your seat belt at all times when driving. 12. Avoid raw meat, uncooked cheese, cat litter boxes, and soil used by cats. These carry germs that can cause birth defects in the baby. 13. Take your prenatal vitamins. 14. Try taking a stool softener (if your health care provider approves) if you develop constipation. Eat more high-fiber foods, such as fresh vegetables or fruit and whole grains. Drink plenty of fluids to keep your urine clear or pale yellow. 15. Take warm sitz baths to soothe any pain or discomfort caused by hemorrhoids. Use hemorrhoid cream if your health care provider approves. 16. If you develop varicose veins, wear support hose. Elevate your feet for 15 minutes, 3-4 times a day. Limit salt in your  diet. 17. Avoid heavy lifting, wear low heel shoes, and practice good posture. 18. Rest with your legs elevated if you have leg cramps or low back pain. 19. Visit your dentist if you have not gone yet during your pregnancy. Use a soft toothbrush to brush your teeth and be gentle when you floss. 20. A sexual relationship may be continued unless your health care provider directs you otherwise. 21. Continue to go to all your prenatal visits as directed by your health care provider. SEEK MEDICAL CARE IF:   You have dizziness.  You have mild pelvic cramps, pelvic pressure, or nagging pain in the abdominal area.  You have persistent nausea, vomiting, or diarrhea.  You have a bad smelling vaginal discharge.  You have pain with urination. SEEK IMMEDIATE MEDICAL CARE IF:   You have a fever.  You are leaking fluid from your  vagina.  You have spotting or bleeding from your vagina.  You have severe abdominal cramping or pain.  You have rapid weight gain or loss.  You have shortness of breath with chest pain.  You notice sudden or extreme swelling of your face, hands, ankles, feet, or legs.  You have not felt your baby move in over an hour.  You have severe headaches that do not go away with medicine.  You have vision changes. Document Released: 11/18/2001 Document Revised: 11/29/2013 Document Reviewed: 01/25/2013 [facility] Patient Information 2015 [place], [place]. This information is not intended to replace advice given to you by your health care provider. Make sure you discuss any questions you have with your health care provider.

## 2016-05-27 ENCOUNTER — Emergency Department (HOSPITAL_COMMUNITY)
Admission: EM | Admit: 2016-05-27 | Discharge: 2016-05-27 | Disposition: A | Discharge status: Home / Self Care | Payer: Medicaid Other | Attending: Emergency Medicine | Admitting: Emergency Medicine

## 2016-05-27 DIAGNOSIS — L0231 Cutaneous abscess of buttock: Secondary | ICD-10-CM

## 2016-05-27 MED ORDER — POVIDONE-IODINE 10 % EX SOLN
CUTANEOUS | Status: AC
Start: 2016-05-27 — End: 2016-05-27
  Administered 2016-05-27: 02:00:00
  Filled 2016-05-27: qty 118

## 2016-05-27 MED ORDER — MORPHINE SULFATE (PF) 4 MG/ML IV SOLN
4.0000 mg | Freq: Once | INTRAVENOUS | Status: AC
  Administered 2016-05-27: 4 mg via INTRAMUSCULAR
  Filled 2016-05-27: qty 1

## 2016-05-27 MED ORDER — BUPIVACAINE HCL (PF) 0.5 % IJ SOLN
10.0000 mL | Freq: Once | INTRAMUSCULAR | Status: AC
  Administered 2016-05-27: 10 mL
  Filled 2016-05-27: qty 30

## 2016-05-27 NOTE — Discharge Instructions (Signed)
Soak in the tub in warm water for 20-30 minutes several times a day for comfort. You can take acetaminophen 1000 mg 4 times a day for pain. Remove the packing which is a thin strip of material on Thursday, June 22.  Recheck if you get a fever, increased redness, swelling. Call [facility] today to see what antibiotic they want you to take.    Percutaneous Abscess Drain, Care After Refer to this sheet in the next few weeks. These instructions provide you with information on caring for yourself after your procedure. Your health care provider may also give you more specific instructions. Your treatment has been planned according to current medical practices, but problems sometimes occur. Call your health care provider if you have any problems or questions after your procedure. WHAT TO EXPECT AFTER THE PROCEDURE After your procedure, it is typical to have the following:   A small amount of discomfort in the area where the drainage tube was placed.  A small amount of bruising around the area where the drainage tube was placed.  Sleepiness and fatigue for the rest of the day from the medicines used. HOME CARE INSTRUCTIONS  Rest at home for 1-2 days following your procedure or as directed by your health care provider.  If you go home right after the procedure, plan to have someone with you for 24 hours.  Do not take a bathor shower for 24 hours after your procedure.  Take medicines only as directed by your health care provider. Ask your health care provider when you can resume taking any normal medicines.  Change bandages (dressings) as directed.   You may be told to record the amount of drainage from the bag every time you empty it. Follow your health care provider's directions for emptying the bag. Write down the amount of drainage, the date, and the time you emptied it.  Call your health care provider when the drain is putting out less than 10 mL of drainage per day for 2-3 days in a row or  as directed by your health care provider.  Follow your health care provider's instructions for cleaning the drainage tube. You may need to clean the tube every day so that it does not clog. SEEK MEDICAL CARE IF:  You have increased bleeding (more than a small spot) from the site where the drainage tube was placed.  You have redness, swelling, or increasing pain around the site where the drainage tube was placed.  You notice a discharge or bad smell coming from the site where the drainage tube was placed.  You have a fever or chills.  You have pain that is not helped by medicine.  SEEK IMMEDIATE MEDICAL CARE IF:  There is leakage around the drainage tube.  The drainage tube pulls out.  You suddenly stop having drainage from the tube.  You suddenly have blood in the drainage fluid.  You become dizzy or faint.  You develop a rash.   You have nausea or vomiting.  You have difficulty breathing, feel short of breath, or feel faint.   You develop chest pain.  You have problems with your speech or vision.  You have trouble balancing or moving your arms or legs.   This information is not intended to replace advice given to you by your health care provider. Make sure you discuss any questions you have with your health care provider.   Document Released: 04/10/2014 Document Revised: 09/12/2014 Document Reviewed: 04/10/2014 Elsevier Interactive Patient Education 2016  Elsevier Inc. ° °

## 2016-05-27 NOTE — ED Provider Notes (Signed)
CSN: [id number]     Arrival date & time 05/26/16  2256 History   First MD Initiated Contact with Patient 05/27/16 0115     Chief Complaint  Patient presents with  . Abscess     (Consider location/radiation/quality/duration/timing/severity/associated sxs/prior Treatment) HPI  patient is P1 G0 Ab0, proximal leg [redacted] weeks pregnant [facility]. She states she started having "tailbone" pain that started about 4-5 days ago. She states today she felt a bump in the area. She denies any fever or drainage. She states she's never had this before and denies any prior boils or abscesses.  Past Medical History  Diagnosis Date  . DUB (dysfunctional uterine bleeding)   . Chronic abdominal pain    Past Surgical History  Procedure Laterality Date  . Appendectomy    . Cholecystectomy     Family History  Problem Relation Age of Onset  . ADD / ADHD Brother   . Diabetes Maternal Grandmother   . Hypertension Maternal Grandmother    Social History  Substance Use Topics  . Smoking status: Never Smoker   . Smokeless tobacco: Never Used  . Alcohol Use: No  unemployed  OB History    Gravida Para Term Preterm AB TAB SAB Ectopic Multiple Living   1              Review of Systems  All other systems reviewed and are negative.     Allergies  Review of patient's allergies indicates no known allergies.  Home Medications  States she's not taking her prenatal vitamins as prescribes "because I forget" Prior to Admission medications   Medication Sig Start Date End Date Taking? Authorizing Provider  Prenatal Multivit-Min-Fe-FA (PRENATAL VITAMINS PO) Take 2 each by mouth daily.    Historical Provider, MD  Pseudoephedrine HCl (SUDAFED 12 HOUR PO) Take by mouth. Reported on 05/26/2016    Historical Provider, MD   BP 125/71 mmHg  Pulse 110  Temp(Src) 98.6 F (37 C) (Oral)  Resp 14  Ht  (1.575 m)  Wt 198 lb (89.812 kg)  BMI 36.21 kg/m2  SpO2 99%  LMP 12/15/2015 (Exact  Date)  Vital signs normal except for tachycardia  Physical Exam  Constitutional: She is oriented to person, place, and time. She appears well-developed and well-nourished.  Non-toxic appearance. She does not appear ill. No distress.  HENT:  Head: Normocephalic and atraumatic.  Right Ear: External ear normal.  Left Ear: External ear normal.  Nose: Nose normal. No mucosal edema or rhinorrhea.  Mouth/Throat: Mucous membranes are normal. No dental abscesses or uvula swelling.  Eyes: Conjunctivae and EOM are normal.  Neck: Normal range of motion and full passive range of motion without pain.  Pulmonary/Chest: Effort normal. No respiratory distress. She has no rhonchi. She exhibits no crepitus.  Abdominal: Normal appearance.  Genitourinary:  Patient's noted to have some redness and swelling of the proximal medial right buttock approximately 2 cm in size.  Musculoskeletal: Normal range of motion. She exhibits no edema or tenderness.  Moves all extremities well.   Neurological: She is alert and oriented to person, place, and time. She has normal strength. No cranial nerve deficit.  Skin: Skin is warm, dry and intact. No rash noted. No erythema. No pallor.  Psychiatric: She has a normal mood and affect. Her speech is normal and behavior is normal. Her mood appears not anxious.  Nursing note and vitals reviewed.   ED Course  Procedures (including critical care time) Medications  morphine 4  MG/ML injection 4 mg (4 mg Intramuscular Given 05/27/16 0138)  bupivacaine (MARCAINE) 0.5 % injection 10 mL (10 mLs Infiltration Given 05/27/16 0139)  povidone-iodine (BETADINE) 10 % external solution (  Given 05/27/16 0138)    Patient was given morphine IM for pain preprocedure. I was going to start patient on antibiotics however they decided to wait and let her OB decide if she needs to be on them. She does not have cellulitis so just the I andD may cure her abscess.   FHT 136  INCISION AND  DRAINAGE Performed by: Ward GivensIva L Challis Crill Consent: Verbal consent obtained. Risks and benefits: risks, benefits and alternatives were discussed Type: abscess  Body area: medial right buttock  Anesthesia: local infiltration  Incision was made with a scalpel.  Local anesthetic: marcaine 0.5 %  Anesthetic total: 2 ml  Complexity: complex Blunt dissection to break up loculations  Drainage: purulent, initially black/purulent  Drainage amount: moderate  Packing material: 1/2 in iodoform gauze (no 1/4 available)  Patient tolerance: Patient tolerated the procedure well with no immediate complications.    MDM   Final diagnoses:  Abscess of buttock, right    Plan discharge  Devoria AlbeIva Jullisa Grigoryan, MD, Concha PyoFACEP     Caran Storck, MD 05/27/16 0530

## 2016-05-28 ENCOUNTER — Encounter: Payer: Self-pay | Admitting: Obstetrics & Gynecology

## 2016-05-28 ENCOUNTER — Ambulatory Visit (INDEPENDENT_AMBULATORY_CARE_PROVIDER_SITE_OTHER): Payer: Medicaid Other | Admitting: Obstetrics & Gynecology

## 2016-05-28 VITALS — BP 110/60 | HR 80 | Wt 198.4 lb

## 2016-05-28 DIAGNOSIS — Z331 Pregnant state, incidental: Secondary | ICD-10-CM

## 2016-05-28 DIAGNOSIS — Z1389 Encounter for screening for other disorder: Secondary | ICD-10-CM

## 2016-05-28 DIAGNOSIS — L0501 Pilonidal cyst with abscess: Secondary | ICD-10-CM

## 2016-05-28 DIAGNOSIS — Z3402 Encounter for supervision of normal first pregnancy, second trimester: Secondary | ICD-10-CM

## 2016-05-28 LAB — POCT URINALYSIS DIPSTICK
Glucose, UA: NEGATIVE
Ketones, UA: NEGATIVE
NITRITE UA: NEGATIVE
RBC UA: NEGATIVE

## 2016-05-28 MED ORDER — SILVER SULFADIAZINE 1 % EX CREA: TOPICAL_CREAM | CUTANEOUS | Status: DC

## 2016-05-28 NOTE — Progress Notes (Signed)
Work in ob appt  Pt seen in ED after a painful "bump" came up on her buttock area  I&D of a pilonidal abscess  Looks pretty good today, really not packing there less than an inch  Treated with gentian violet  Local care instructions of peroxide and silvadene cream given   Follow up 1 week     Face to face time:  15 minutes  Greater than 50% of the visit time was spent in counseling and coordination of care with the patient.  The summary and outline of the counseling and care coordination is summarized in the note above.   All questions were answered.

## 2016-06-04 ENCOUNTER — Encounter: Payer: Self-pay | Admitting: Obstetrics & Gynecology

## 2016-06-04 ENCOUNTER — Ambulatory Visit (INDEPENDENT_AMBULATORY_CARE_PROVIDER_SITE_OTHER): Payer: Medicaid Other | Admitting: Obstetrics & Gynecology

## 2016-06-04 VITALS — Wt 199.4 lb

## 2016-06-04 DIAGNOSIS — Z1389 Encounter for screening for other disorder: Secondary | ICD-10-CM

## 2016-06-04 DIAGNOSIS — Z3402 Encounter for supervision of normal first pregnancy, second trimester: Secondary | ICD-10-CM

## 2016-06-04 DIAGNOSIS — Z331 Pregnant state, incidental: Secondary | ICD-10-CM

## 2016-06-04 DIAGNOSIS — Z3A25 25 weeks gestation of pregnancy: Secondary | ICD-10-CM

## 2016-06-04 NOTE — Progress Notes (Signed)
G1P0 9535w4d Estimated Date of Delivery: 09/20/16  Weight 199 lb 6.4 oz (90.447 kg), last menstrual period 12/15/2015.   BP weight and urine results all reviewed and noted.  Please refer to the obstetrical flow sheet for the fundal height and fetal heart rate documentation:  Patient reports good fetal movement, denies any bleeding and no rupture of membranes symptoms or regular contractions. Patient is without complaints. All questions were answered.  Orders Placed This Encounter  Procedures  . POCT urinalysis dipstick    Plan:  Continued routine obstetrical care, pilonidal abscess is itching feels much better, using silvadene cream  No Follow-up on file.

## 2016-06-17 ENCOUNTER — Ambulatory Visit (INDEPENDENT_AMBULATORY_CARE_PROVIDER_SITE_OTHER): Payer: Medicaid Other | Admitting: Advanced Practice Midwife

## 2016-06-17 VITALS — BP 120/52 | HR 102 | Wt 200.0 lb

## 2016-06-17 DIAGNOSIS — Z1389 Encounter for screening for other disorder: Secondary | ICD-10-CM

## 2016-06-17 DIAGNOSIS — Z331 Pregnant state, incidental: Secondary | ICD-10-CM

## 2016-06-17 DIAGNOSIS — O23592 Infection of other part of genital tract in pregnancy, second trimester: Secondary | ICD-10-CM | POA: Diagnosis not present

## 2016-06-17 DIAGNOSIS — Z3402 Encounter for supervision of normal first pregnancy, second trimester: Secondary | ICD-10-CM

## 2016-06-17 DIAGNOSIS — Z3A27 27 weeks gestation of pregnancy: Secondary | ICD-10-CM

## 2016-06-17 LAB — POCT URINALYSIS DIPSTICK
Glucose, UA: NEGATIVE
Glucose, UA: NEGATIVE
KETONES UA: NEGATIVE
Nitrite, UA: NEGATIVE
PROTEIN UA: 1
Protein, UA: NEGATIVE
RBC UA: NEGATIVE

## 2016-06-17 NOTE — Progress Notes (Signed)
G1P0 9134w3d Estimated Date of Delivery: 09/20/16    WORK IN FOR YELLOW/GREEN DC.  For a week. Denies itchi/irritation.   Blood pressure 120/52, pulse 102, weight 200 lb (90.719 kg), last menstrual period 12/15/2015.   BP weight and urine results all reviewed and noted.  Please refer to the obstetrical flow sheet for the fundal height and fetal heart rate documentation:  Patient reports good fetal movement, denies any bleeding and no rupture of membranes symptoms or regular contractions. SSE:  Thick greenish dc c/w yeast.  Wet prep shows yeast, few WBC, no trich or clue All questions were answered.  Orders Placed This Encounter  Procedures  . POCT urinalysis dipstick    Plan:  Continued routine obstetrical care, OTC vaginal yeast med  Return for As scheduled.

## 2016-06-17 NOTE — Patient Instructions (Signed)

## 2016-06-23 ENCOUNTER — Ambulatory Visit (INDEPENDENT_AMBULATORY_CARE_PROVIDER_SITE_OTHER): Payer: Medicaid Other | Admitting: Women's Health

## 2016-06-23 ENCOUNTER — Other Ambulatory Visit: Payer: Medicaid Other

## 2016-06-23 ENCOUNTER — Encounter: Payer: Self-pay | Admitting: Women's Health

## 2016-06-23 VITALS — BP 120/60 | HR 92 | Wt 200.0 lb

## 2016-06-23 DIAGNOSIS — Z3402 Encounter for supervision of normal first pregnancy, second trimester: Secondary | ICD-10-CM

## 2016-06-23 DIAGNOSIS — Z369 Encounter for antenatal screening, unspecified: Secondary | ICD-10-CM

## 2016-06-23 DIAGNOSIS — Z1389 Encounter for screening for other disorder: Secondary | ICD-10-CM

## 2016-06-23 DIAGNOSIS — Z3403 Encounter for supervision of normal first pregnancy, third trimester: Secondary | ICD-10-CM

## 2016-06-23 DIAGNOSIS — Z331 Pregnant state, incidental: Secondary | ICD-10-CM

## 2016-06-23 DIAGNOSIS — Z131 Encounter for screening for diabetes mellitus: Secondary | ICD-10-CM

## 2016-06-23 DIAGNOSIS — Z3A28 28 weeks gestation of pregnancy: Secondary | ICD-10-CM

## 2016-06-23 LAB — POCT URINALYSIS DIPSTICK
GLUCOSE UA: NEGATIVE
Ketones, UA: NEGATIVE
NITRITE UA: NEGATIVE
RBC UA: NEGATIVE

## 2016-06-23 NOTE — Progress Notes (Signed)
Low-risk OB appointment G1P0 6214w2d Estimated Date of Delivery: 09/20/16 BP 120/60 mmHg  Pulse 92  Wt 200 lb (90.719 kg)  LMP 12/15/2015 (Exact Date)  BP, weight, and urine reviewed.  Refer to obstetrical flow sheet for FH & FHR.  Reports good fm.  Denies regular uc's, lof, vb, or uti s/s. No complaints. Hasn't signed up for cb classes yet, still interested, sign up asap.  Reviewed ptl s/s, fkc. Recommended Tdap at HD/PCP per CDC guidelines.  Plan:  Continue routine obstetrical care  F/U in 3wks for OB appointment  PN2 today

## 2016-06-23 NOTE — Patient Instructions (Signed)

## 2016-06-24 LAB — CBC
Hematocrit: 31.9 % — ABNORMAL LOW (ref 34.0–46.6)
Hemoglobin: 10.6 g/dL — ABNORMAL LOW (ref 11.1–15.9)
MCH: 28 pg (ref 26.6–33.0)
MCHC: 33.2 g/dL (ref 31.5–35.7)
MCV: 84 fL (ref 79–97)
PLATELETS: 324 10*3/uL (ref 150–379)
RBC: 3.79 x10E6/uL (ref 3.77–5.28)
RDW: 12.9 % (ref 12.3–15.4)
WBC: 9.5 10*3/uL (ref 3.4–10.8)

## 2016-06-24 LAB — GLUCOSE TOLERANCE, 2 HOURS W/ 1HR
GLUCOSE, FASTING: 80 mg/dL (ref 65–91)
Glucose, 1 hour: 110 mg/dL (ref 65–179)
Glucose, 2 hour: 103 mg/dL (ref 65–152)

## 2016-06-24 LAB — RPR: RPR Ser Ql: NONREACTIVE

## 2016-06-24 LAB — ANTIBODY SCREEN: Antibody Screen: NEGATIVE

## 2016-06-24 LAB — HIV ANTIBODY (ROUTINE TESTING W REFLEX): HIV Screen 4th Generation wRfx: NONREACTIVE

## 2016-07-14 ENCOUNTER — Encounter: Payer: Self-pay | Admitting: Obstetrics and Gynecology

## 2016-07-14 ENCOUNTER — Ambulatory Visit (INDEPENDENT_AMBULATORY_CARE_PROVIDER_SITE_OTHER): Payer: Medicaid Other | Admitting: Obstetrics and Gynecology

## 2016-07-14 VITALS — BP 120/74 | HR 104 | Wt 201.0 lb

## 2016-07-14 DIAGNOSIS — Z331 Pregnant state, incidental: Secondary | ICD-10-CM

## 2016-07-14 DIAGNOSIS — Z3493 Encounter for supervision of normal pregnancy, unspecified, third trimester: Secondary | ICD-10-CM

## 2016-07-14 DIAGNOSIS — Z1389 Encounter for screening for other disorder: Secondary | ICD-10-CM

## 2016-07-14 LAB — POCT URINALYSIS DIPSTICK
Blood, UA: NEGATIVE
Ketones, UA: NEGATIVE
LEUKOCYTES UA: NEGATIVE
NITRITE UA: NEGATIVE

## 2016-07-14 NOTE — Progress Notes (Signed)
Patient ID: Griselda MinerDominique E Hubbs, female   DOB: June 29, 1996, 20 y.o.   MRN: [id number]  G1P0 3151w2d Estimated Date of Delivery: 09/20/16 LROB  Patient reports good fetal movement, denies any bleeding and no rupture of membranes symptoms or regular contractions.  Patient complaints: none.  Blood pressure 120/74, pulse (!) 104, weight 201 lb (91.2 kg), last menstrual period 12/15/2015.  refer to the ob flow sheet for FH and FHR, also BP, Wt, Urine results:notable for trace glucose, trace protein                           Physical Examination: General appearance - alert, well appearing, and in no distress                                      Abdomen - FH 33 cm,                                                         -FHR 147 bpm                                                         soft, nontender, nondistended, no masses or organomegaly                                            Questions were answered. Assessment: LROB G1P0 @ 4851w2d   Plan:  Continued routine obstetrical care Advised pt to attend childbirth classes  F/u in 2 weeks for routine prenatal care    By signing my name below, I, Doreatha MartinEva Mathews, attest that this documentation has been prepared under the direction and in the presence of Tilda BurrowJohn V Xitlaly Ault, MD. Electronically Signed: Doreatha MartinEva Mathews, ED Scribe. 07/14/16. 12:16 PM.  I personally performed the services described in this documentation, which was SCRIBED in my presence. The recorded information has been reviewed and considered accurate. It has been edited as necessary during review. Tilda BurrowFERGUSON,La Shehan V, MD

## 2016-07-14 NOTE — Progress Notes (Signed)
Pt denies any problems or concerns at this time.  

## 2016-07-29 ENCOUNTER — Ambulatory Visit (INDEPENDENT_AMBULATORY_CARE_PROVIDER_SITE_OTHER): Payer: Medicaid Other | Admitting: Obstetrics & Gynecology

## 2016-07-29 ENCOUNTER — Encounter: Payer: Self-pay | Admitting: Obstetrics & Gynecology

## 2016-07-29 VITALS — BP 110/80 | HR 84 | Wt 203.4 lb

## 2016-07-29 DIAGNOSIS — Z3402 Encounter for supervision of normal first pregnancy, second trimester: Secondary | ICD-10-CM

## 2016-07-29 DIAGNOSIS — Z1389 Encounter for screening for other disorder: Secondary | ICD-10-CM

## 2016-07-29 DIAGNOSIS — Z331 Pregnant state, incidental: Secondary | ICD-10-CM

## 2016-07-29 LAB — POCT URINALYSIS DIPSTICK
GLUCOSE UA: NEGATIVE
Ketones, UA: NEGATIVE
Leukocytes, UA: NEGATIVE
Nitrite, UA: NEGATIVE
RBC UA: NEGATIVE

## 2016-07-29 NOTE — Progress Notes (Signed)
G1P0 5259w3d Estimated Date of Delivery: 09/20/16  Blood pressure 110/80, pulse 84, weight 203 lb 6.4 oz (92.3 kg), last menstrual period 12/15/2015.   BP weight and urine results all reviewed and noted.  Please refer to the obstetrical flow sheet for the fundal height and fetal heart rate documentation:  Patient reports good fetal movement, denies any bleeding and no rupture of membranes symptoms or regular contractions. Patient is without complaints. All questions were answered.  Orders Placed This Encounter  Procedures  . POCT urinalysis dipstick    Plan:  Continued routine obstetrical care,   Return in about 2 weeks (around 08/12/2016) for LROB.

## 2016-08-06 ENCOUNTER — Telehealth: Payer: Self-pay | Admitting: *Deleted

## 2016-08-06 ENCOUNTER — Ambulatory Visit (INDEPENDENT_AMBULATORY_CARE_PROVIDER_SITE_OTHER): Payer: Medicaid Other | Admitting: Obstetrics and Gynecology

## 2016-08-06 VITALS — BP 130/70 | HR 107 | Wt 199.8 lb

## 2016-08-06 DIAGNOSIS — Z3403 Encounter for supervision of normal first pregnancy, third trimester: Secondary | ICD-10-CM

## 2016-08-06 DIAGNOSIS — O212 Late vomiting of pregnancy: Secondary | ICD-10-CM

## 2016-08-06 DIAGNOSIS — R197 Diarrhea, unspecified: Secondary | ICD-10-CM | POA: Insufficient documentation

## 2016-08-06 DIAGNOSIS — Z3A34 34 weeks gestation of pregnancy: Secondary | ICD-10-CM

## 2016-08-06 NOTE — Progress Notes (Signed)
WORK IN OB  G1P0 9049w4d Estimated Date of Delivery: 09/20/16 LROB  Patient reports good fetal movement, denies any bleeding and no rupture of membranes symptoms or regular contractions. Patient complaints: vomting, diarrhea, and watery mucous discharge. Pt states that she has had associated symptoms of diarrhea x 4 episodes since 6 AM this morning and vomiting x this morning. Pt hasn't tried any medications for the relief of her symptoms. Denies contractions, fever, and any other symptoms.   Blood pressure 130/70, pulse (!) 107, weight 199 lb 12.8 oz (90.6 kg), last menstrual period 12/15/2015. refer to the ob flow sheet for FH and FHR, also BP, Wt, Urine results:notable for N/A (pt unable to void)                          Physical Examination:  General appearance - alert, well appearing, and in no distress Abdomen - FH 36 cm                  -FHR 154 soft, nontender, nondistended, no guarding, no rebound, no masses or organomegaly. Nl bowel sounds.  Pelvic - normal external genitalia, vulva, vagina, cervix, uterus and adnexa, VULVA: normal appearing vulva with no masses, tenderness or lesions,  VAGINA: normal appearing vagina with normal color and discharge, no lesions,  CERVIX: normal appearing cervix without discharge or lesions, posterior, low, and closed UTERUS: uterus is normal size, shape, consistency and nontender,  ADNEXA: normal adnexa in size, nontender and no masses                                         Questions were answered. Assessment: LROB G1P0 @ 4649w4d Mild gastroenteritis, no evidence of acute abdomen Normal secretions  Plan:  Continued routine obstetrical care,  F/u as scheduled for appointment on 08/12/16   By signing my name below, I, Soijett Blue, attest that this documentation has been prepared under the direction and in the presence of Tilda BurrowJohn V Austen Oyster, MD. Electronically Signed: Soijett Blue, ED Scribe. 08/06/16. 1:40 PM.  I personally performed the services  described in this documentation, which was SCRIBED in my presence. The recorded information has been reviewed and considered accurate. It has been edited as necessary during review. Tilda BurrowFERGUSON,Jaeline Whobrey V, MD

## 2016-08-06 NOTE — Telephone Encounter (Signed)
Pt c/o liquid diarrhea, vomiting, mucus discharge, felt FM x 1 this am. Pt states she also had a watery discharge. Pt given an appt today for evaluation.

## 2016-08-12 ENCOUNTER — Encounter: Payer: Self-pay | Admitting: Obstetrics & Gynecology

## 2016-08-12 ENCOUNTER — Ambulatory Visit (INDEPENDENT_AMBULATORY_CARE_PROVIDER_SITE_OTHER): Payer: Medicaid Other | Admitting: Obstetrics & Gynecology

## 2016-08-12 VITALS — BP 110/80 | HR 94 | Wt 204.0 lb

## 2016-08-12 DIAGNOSIS — Z3402 Encounter for supervision of normal first pregnancy, second trimester: Secondary | ICD-10-CM

## 2016-08-12 DIAGNOSIS — Z331 Pregnant state, incidental: Secondary | ICD-10-CM

## 2016-08-12 DIAGNOSIS — Z1389 Encounter for screening for other disorder: Secondary | ICD-10-CM

## 2016-08-12 LAB — POCT URINALYSIS DIPSTICK
Glucose, UA: NEGATIVE
KETONES UA: NEGATIVE
Leukocytes, UA: NEGATIVE
Nitrite, UA: NEGATIVE
PROTEIN UA: NEGATIVE
RBC UA: NEGATIVE

## 2016-08-12 NOTE — Progress Notes (Signed)
G1P0 439w3d Estimated Date of Delivery: 09/20/16  Blood pressure 110/80, pulse 94, weight 204 lb (92.5 kg), last menstrual period 12/15/2015.   BP weight and urine results all reviewed and noted.  Please refer to the obstetrical flow sheet for the fundal height and fetal heart rate documentation:  Patient reports good fetal movement, denies any bleeding and no rupture of membranes symptoms or regular contractions. Patient is without complaints. All questions were answered.  Orders Placed This Encounter  Procedures  . POCT urinalysis dipstick    Plan:  Continued routine obstetrical care, GBS next visit  Return in about 2 weeks (around 08/26/2016) for LROB, GBS.

## 2016-08-26 ENCOUNTER — Encounter: Payer: Self-pay | Admitting: Advanced Practice Midwife

## 2016-08-26 ENCOUNTER — Ambulatory Visit (INDEPENDENT_AMBULATORY_CARE_PROVIDER_SITE_OTHER): Payer: Medicaid Other | Admitting: Advanced Practice Midwife

## 2016-08-26 VITALS — BP 130/72 | HR 96 | Wt 208.0 lb

## 2016-08-26 DIAGNOSIS — Z3403 Encounter for supervision of normal first pregnancy, third trimester: Secondary | ICD-10-CM

## 2016-08-26 DIAGNOSIS — Z3A37 37 weeks gestation of pregnancy: Secondary | ICD-10-CM

## 2016-08-26 DIAGNOSIS — Z1159 Encounter for screening for other viral diseases: Secondary | ICD-10-CM

## 2016-08-26 DIAGNOSIS — Z331 Pregnant state, incidental: Secondary | ICD-10-CM

## 2016-08-26 DIAGNOSIS — R809 Proteinuria, unspecified: Secondary | ICD-10-CM

## 2016-08-26 DIAGNOSIS — Z3685 Encounter for antenatal screening for Streptococcus B: Secondary | ICD-10-CM

## 2016-08-26 DIAGNOSIS — O1213 Gestational proteinuria, third trimester: Secondary | ICD-10-CM

## 2016-08-26 DIAGNOSIS — Z1389 Encounter for screening for other disorder: Secondary | ICD-10-CM

## 2016-08-26 DIAGNOSIS — Z118 Encounter for screening for other infectious and parasitic diseases: Secondary | ICD-10-CM

## 2016-08-26 LAB — POCT URINALYSIS DIPSTICK
Glucose, UA: NEGATIVE
Ketones, UA: NEGATIVE
NITRITE UA: NEGATIVE

## 2016-08-26 LAB — OB RESULTS CONSOLE GBS: STREP GROUP B AG: NEGATIVE

## 2016-08-26 NOTE — Patient Instructions (Signed)

## 2016-08-26 NOTE — Addendum Note (Signed)
Addended by: Moss McRESENZO, Tonantzin Mimnaugh M on: 08/26/2016 12:16 PM   Modules accepted: Orders

## 2016-08-26 NOTE — Progress Notes (Signed)
G1P0 5660w3d Estimated Date of Delivery: 09/20/16  Blood pressure 130/72, pulse 96, weight 208 lb (94.3 kg), last menstrual period 12/15/2015.   BP weight and urine results all reviewed and noted.  Please refer to the obstetrical flow sheet for the fundal height and fetal heart rate documentation:  Patient reports good fetal movement, denies any bleeding and no rupture of membranes symptoms or regular contractions. Patient is without complaints. denies UTI sx All questions were answered.  Orders Placed This Encounter  Procedures  . Culture, beta strep (group b only)  . Urine culture  . POCT urinalysis dipstick    Plan:  Continued routine obstetrical care,   Return in about 1 week (around 09/02/2016) for LROB; Order Nexplanon today please.

## 2016-08-27 LAB — GC/CHLAMYDIA PROBE AMP
Chlamydia trachomatis, NAA: NEGATIVE
Neisseria gonorrhoeae by PCR: NEGATIVE

## 2016-08-28 LAB — URINE CULTURE

## 2016-08-30 LAB — CULTURE, BETA STREP (GROUP B ONLY): STREP GP B CULTURE: NEGATIVE

## 2016-09-02 ENCOUNTER — Ambulatory Visit (INDEPENDENT_AMBULATORY_CARE_PROVIDER_SITE_OTHER): Payer: Medicaid Other | Admitting: Women's Health

## 2016-09-02 ENCOUNTER — Encounter: Payer: Self-pay | Admitting: Women's Health

## 2016-09-02 VITALS — BP 122/60 | HR 84 | Wt 209.0 lb

## 2016-09-02 DIAGNOSIS — Z1389 Encounter for screening for other disorder: Secondary | ICD-10-CM

## 2016-09-02 DIAGNOSIS — Z331 Pregnant state, incidental: Secondary | ICD-10-CM

## 2016-09-02 DIAGNOSIS — Z3403 Encounter for supervision of normal first pregnancy, third trimester: Secondary | ICD-10-CM

## 2016-09-02 LAB — POCT URINALYSIS DIPSTICK
GLUCOSE UA: NEGATIVE
NITRITE UA: NEGATIVE
RBC UA: NEGATIVE

## 2016-09-02 NOTE — Progress Notes (Signed)
Low-risk OB appointment G1P0 6039w3d Estimated Date of Delivery: 09/20/16 BP 122/60   Pulse 84   Wt 209 lb (94.8 kg)   LMP 12/15/2015 (Exact Date)   BMI 38.23 kg/m   BP, weight, and urine reviewed.  Refer to obstetrical flow sheet for FH & FHR.  Reports good fm.  Denies regular uc's, lof, vb, or uti s/s. No complaints. SVE per request: 3/50/ballotable, vtx Reviewed labor s/s, fkc, gbs-. Plan:  Continue routine obstetrical care  F/U in 1wk for OB appointment  Recommended flu shot w/ pcp/hd (<21yo)

## 2016-09-02 NOTE — Patient Instructions (Signed)
Call the office ([phone]) or go to [facility] if:  You begin to have strong, frequent contractions  Your water breaks.  Sometimes it is a big gush of fluid, sometimes it is just a trickle that keeps getting your panties wet or running down your legs  You have vaginal bleeding.  It is normal to have a small amount of spotting if your cervix was checked.   You don't feel your baby moving like normal.  If you don't, get you something to eat and drink and lay down and focus on feeling your baby move.  You should feel at least 10 movements in 2 hours.  If you don't, you should call the office or go to [facility].    Braxton Hicks Contractions Contractions of the uterus can occur throughout pregnancy. Contractions are not always a sign that you are in labor.  WHAT ARE BRAXTON HICKS CONTRACTIONS?  Contractions that occur before labor are called Braxton Hicks contractions, or false labor. Toward the end of pregnancy (32-34 weeks), these contractions can develop more often and may become more forceful. This is not true labor because these contractions do not result in opening (dilatation) and thinning of the cervix. They are sometimes difficult to tell apart from true labor because these contractions can be forceful and people have different pain tolerances. You should not feel embarrassed if you go to the hospital with false labor. Sometimes, the only way to tell if you are in true labor is for your health care provider to look for changes in the cervix. If there are no prenatal problems or other health problems associated with the pregnancy, it is completely safe to be sent home with false labor and await the onset of true labor. HOW CAN YOU TELL THE DIFFERENCE BETWEEN TRUE AND FALSE LABOR? False Labor  The contractions of false labor are usually shorter and not as hard as those of true labor.   The contractions are usually irregular.   The contractions are often felt in the front of  the lower abdomen and in the groin.   The contractions may go away when you walk around or change positions while lying down.   The contractions get weaker and are shorter lasting as time goes on.   The contractions do not usually become progressively stronger, regular, and closer together as with true labor.  True Labor  Contractions in true labor last 30-70 seconds, become very regular, usually become more intense, and increase in frequency.   The contractions do not go away with walking.   The discomfort is usually felt in the top of the uterus and spreads to the lower abdomen and low back.   True labor can be determined by your health care provider with an exam. This will show that the cervix is dilating and getting thinner.  WHAT TO REMEMBER  Keep up with your usual exercises and follow other instructions given by your health care provider.   Take medicines as directed by your health care provider.   Keep your regular prenatal appointments.   Eat and drink lightly if you think you are going into labor.   If Braxton Hicks contractions are making you uncomfortable:   Change your position from lying down or resting to walking, or from walking to resting.   Sit and rest in a tub of warm water.   Drink 2-3 glasses of water. Dehydration may cause these contractions.   Do slow and deep breathing several times an hour.    WHEN SHOULD I SEEK IMMEDIATE MEDICAL CARE? Seek immediate medical care if:  Your contractions become stronger, more regular, and closer together.   You have fluid leaking or gushing from your vagina.   You have a fever.   You pass blood-tinged mucus.   You have vaginal bleeding.   You have continuous abdominal pain.   You have low back pain that you never had before.   You feel your baby's head pushing down and causing pelvic pressure.   Your baby is not moving as much as it used to.    This information is not intended to  replace advice given to you by your health care provider. Make sure you discuss any questions you have with your health care provider.   Document Released: 11/24/2005 Document Revised: 11/29/2013 Document Reviewed: 09/05/2013 Elsevier Interactive Patient Education 2016 [employer]  

## 2016-09-08 ENCOUNTER — Ambulatory Visit (INDEPENDENT_AMBULATORY_CARE_PROVIDER_SITE_OTHER): Payer: Medicaid Other | Admitting: Women's Health

## 2016-09-08 VITALS — BP 130/70 | HR 72 | Wt 210.0 lb

## 2016-09-08 DIAGNOSIS — Z331 Pregnant state, incidental: Secondary | ICD-10-CM

## 2016-09-08 DIAGNOSIS — Z3403 Encounter for supervision of normal first pregnancy, third trimester: Secondary | ICD-10-CM

## 2016-09-08 DIAGNOSIS — Z1389 Encounter for screening for other disorder: Secondary | ICD-10-CM

## 2016-09-08 LAB — POCT URINALYSIS DIPSTICK
GLUCOSE UA: NEGATIVE
Ketones, UA: NEGATIVE
Leukocytes, UA: NEGATIVE
NITRITE UA: NEGATIVE
RBC UA: NEGATIVE

## 2016-09-08 NOTE — Progress Notes (Signed)
Low-risk OB appointment G1P0 8272w2d Estimated Date of Delivery: 09/20/16 BP 130/70   Pulse 72   Wt 210 lb (95.3 kg)   LMP 12/15/2015 (Exact Date)   BMI 38.41 kg/m   BP, weight, and urine reviewed.  Refer to obstetrical flow sheet for FH & FHR.  Reports good fm.  Denies regular uc's, lof, vb, or uti s/s. No complaints. SVE per request: 3/70/ballotable, vtx Reviewed labor s/s, fkc. Plan:  Continue routine obstetrical care  F/U in 1wk for OB appointment

## 2016-09-08 NOTE — Patient Instructions (Signed)
Call the office ([phone]) or go to [facility] if:  You begin to have strong, frequent contractions  Your water breaks.  Sometimes it is a big gush of fluid, sometimes it is just a trickle that keeps getting your panties wet or running down your legs  You have vaginal bleeding.  It is normal to have a small amount of spotting if your cervix was checked.   You don't feel your baby moving like normal.  If you don't, get you something to eat and drink and lay down and focus on feeling your baby move.  You should feel at least 10 movements in 2 hours.  If you don't, you should call the office or go to [facility].    Braxton Hicks Contractions Contractions of the uterus can occur throughout pregnancy. Contractions are not always a sign that you are in labor.  WHAT ARE BRAXTON HICKS CONTRACTIONS?  Contractions that occur before labor are called Braxton Hicks contractions, or false labor. Toward the end of pregnancy (32-34 weeks), these contractions can develop more often and may become more forceful. This is not true labor because these contractions do not result in opening (dilatation) and thinning of the cervix. They are sometimes difficult to tell apart from true labor because these contractions can be forceful and people have different pain tolerances. You should not feel embarrassed if you go to the hospital with false labor. Sometimes, the only way to tell if you are in true labor is for your health care provider to look for changes in the cervix. If there are no prenatal problems or other health problems associated with the pregnancy, it is completely safe to be sent home with false labor and await the onset of true labor. HOW CAN YOU TELL THE DIFFERENCE BETWEEN TRUE AND FALSE LABOR? False Labor  The contractions of false labor are usually shorter and not as hard as those of true labor.   The contractions are usually irregular.   The contractions are often felt in the front of  the lower abdomen and in the groin.   The contractions may go away when you walk around or change positions while lying down.   The contractions get weaker and are shorter lasting as time goes on.   The contractions do not usually become progressively stronger, regular, and closer together as with true labor.  True Labor  Contractions in true labor last 30-70 seconds, become very regular, usually become more intense, and increase in frequency.   The contractions do not go away with walking.   The discomfort is usually felt in the top of the uterus and spreads to the lower abdomen and low back.   True labor can be determined by your health care provider with an exam. This will show that the cervix is dilating and getting thinner.  WHAT TO REMEMBER  Keep up with your usual exercises and follow other instructions given by your health care provider.   Take medicines as directed by your health care provider.   Keep your regular prenatal appointments.   Eat and drink lightly if you think you are going into labor.   If Braxton Hicks contractions are making you uncomfortable:   Change your position from lying down or resting to walking, or from walking to resting.   Sit and rest in a tub of warm water.   Drink 2-3 glasses of water. Dehydration may cause these contractions.   Do slow and deep breathing several times an hour.    WHEN SHOULD I SEEK IMMEDIATE MEDICAL CARE? Seek immediate medical care if:  Your contractions become stronger, more regular, and closer together.   You have fluid leaking or gushing from your vagina.   You have a fever.   You pass blood-tinged mucus.   You have vaginal bleeding.   You have continuous abdominal pain.   You have low back pain that you never had before.   You feel your baby's head pushing down and causing pelvic pressure.   Your baby is not moving as much as it used to.    This information is not intended to  replace advice given to you by your health care provider. Make sure you discuss any questions you have with your health care provider.   Document Released: 11/24/2005 Document Revised: 11/29/2013 Document Reviewed: 09/05/2013 Elsevier Interactive Patient Education 2016 [employer]  

## 2016-09-11 ENCOUNTER — Telehealth: Payer: Self-pay | Admitting: *Deleted

## 2016-09-11 ENCOUNTER — Ambulatory Visit (INDEPENDENT_AMBULATORY_CARE_PROVIDER_SITE_OTHER): Payer: Medicaid Other | Admitting: Women's Health

## 2016-09-11 VITALS — BP 130/70 | HR 86 | Wt 212.0 lb

## 2016-09-11 DIAGNOSIS — Z1389 Encounter for screening for other disorder: Secondary | ICD-10-CM

## 2016-09-11 DIAGNOSIS — O368130 Decreased fetal movements, third trimester, not applicable or unspecified: Secondary | ICD-10-CM

## 2016-09-11 DIAGNOSIS — Z331 Pregnant state, incidental: Secondary | ICD-10-CM

## 2016-09-11 LAB — POCT URINALYSIS DIPSTICK
Blood, UA: NEGATIVE
Glucose, UA: NEGATIVE
KETONES UA: NEGATIVE
LEUKOCYTES UA: NEGATIVE
NITRITE UA: NEGATIVE

## 2016-09-11 NOTE — Progress Notes (Signed)
Work-in Low-risk OB appointment G1P0 7724w5d Estimated Date of Delivery: 09/20/16 BP 130/70   Pulse 86   Wt 212 lb (96.2 kg)   LMP 12/15/2015 (Exact Date)   BMI 38.78 kg/m   BP, weight, and urine reviewed.  Refer to obstetrical flow sheet for FH & FHR.  Reports decreased fm today, still moving- just not getting 10/2hr.  Denies regular uc's, lof, vb, or uti s/s.  Has felt normal fm since on efm Reactive NST/Cat I, no uc's Reviewed labor s/s, fkc. Plan:  Continue routine obstetrical care  F/U as scheduled for OB appointment

## 2016-09-11 NOTE — Patient Instructions (Signed)
Fetal Movement Counts  Patient Name: __________________________________________________ Patient Due Date: ____________________  Performing a fetal movement count is highly recommended in high-risk pregnancies, but it is good for every pregnant woman to do. Your health care provider may ask you to start counting fetal movements at 28 weeks of the pregnancy. Fetal movements often increase:  · After eating a full meal.  · After physical activity.  · After eating or drinking something sweet or cold.  · At rest.  Pay attention to when you feel the baby is most active. This will help you notice a pattern of your baby's sleep and wake cycles and what factors contribute to an increase in fetal movement. It is important to perform a fetal movement count at the same time each day when your baby is normally most active.   HOW TO COUNT FETAL MOVEMENTS  1. Find a quiet and comfortable area to sit or lie down on your left side. Lying on your left side provides the best blood and oxygen circulation to your baby.  2. Write down the day and time on a sheet of paper or in a journal.  3. Start counting kicks, flutters, swishes, rolls, or jabs in a 2-hour period. You should feel at least 10 movements within 2 hours.  4. If you do not feel 10 movements in 2 hours, wait 2-3 hours and count again. Look for a change in the pattern or not enough counts in 2 hours.  SEEK MEDICAL CARE IF:  · You feel less than 10 counts in 2 hours, tried twice.  · There is no movement in over an hour.  · The pattern is changing or taking longer each day to reach 10 counts in 2 hours.  · You feel the baby is not moving as he or she usually does.  Date: ____________ Movements: ____________ Start time: ____________ Finish time: ____________   Date: ____________ Movements: ____________ Start time: ____________ Finish time: ____________  Date: ____________ Movements: ____________ Start time: ____________ Finish time: ____________  Date: ____________ Movements:  ____________ Start time: ____________ Finish time: ____________  Date: ____________ Movements: ____________ Start time: ____________ Finish time: ____________  Date: ____________ Movements: ____________ Start time: ____________ Finish time: ____________  Date: ____________ Movements: ____________ Start time: ____________ Finish time: ____________  Date: ____________ Movements: ____________ Start time: ____________ Finish time: ____________   Date: ____________ Movements: ____________ Start time: ____________ Finish time: ____________  Date: ____________ Movements: ____________ Start time: ____________ Finish time: ____________  Date: ____________ Movements: ____________ Start time: ____________ Finish time: ____________  Date: ____________ Movements: ____________ Start time: ____________ Finish time: ____________  Date: ____________ Movements: ____________ Start time: ____________ Finish time: ____________  Date: ____________ Movements: ____________ Start time: ____________ Finish time: ____________  Date: ____________ Movements: ____________ Start time: ____________ Finish time: ____________   Date: ____________ Movements: ____________ Start time: ____________ Finish time: ____________  Date: ____________ Movements: ____________ Start time: ____________ Finish time: ____________  Date: ____________ Movements: ____________ Start time: ____________ Finish time: ____________  Date: ____________ Movements: ____________ Start time: ____________ Finish time: ____________  Date: ____________ Movements: ____________ Start time: ____________ Finish time: ____________  Date: ____________ Movements: ____________ Start time: ____________ Finish time: ____________  Date: ____________ Movements: ____________ Start time: ____________ Finish time: ____________   Date: ____________ Movements: ____________ Start time: ____________ Finish time: ____________  Date: ____________ Movements: ____________ Start time: ____________ Finish  time: ____________  Date: ____________ Movements: ____________ Start time: ____________ Finish time: ____________  Date: ____________ Movements: ____________ Start time:   ____________ Finish time: ____________  Date: ____________ Movements: ____________ Start time: ____________ Finish time: ____________  Date: ____________ Movements: ____________ Start time: ____________ Finish time: ____________  Date: ____________ Movements: ____________ Start time: ____________ Finish time: ____________   Date: ____________ Movements: ____________ Start time: ____________ Finish time: ____________  Date: ____________ Movements: ____________ Start time: ____________ Finish time: ____________  Date: ____________ Movements: ____________ Start time: ____________ Finish time: ____________  Date: ____________ Movements: ____________ Start time: ____________ Finish time: ____________  Date: ____________ Movements: ____________ Start time: ____________ Finish time: ____________  Date: ____________ Movements: ____________ Start time: ____________ Finish time: ____________  Date: ____________ Movements: ____________ Start time: ____________ Finish time: ____________   Date: ____________ Movements: ____________ Start time: ____________ Finish time: ____________  Date: ____________ Movements: ____________ Start time: ____________ Finish time: ____________  Date: ____________ Movements: ____________ Start time: ____________ Finish time: ____________  Date: ____________ Movements: ____________ Start time: ____________ Finish time: ____________  Date: ____________ Movements: ____________ Start time: ____________ Finish time: ____________  Date: ____________ Movements: ____________ Start time: ____________ Finish time: ____________  Date: ____________ Movements: ____________ Start time: ____________ Finish time: ____________   Date: ____________ Movements: ____________ Start time: ____________ Finish time: ____________  Date: ____________  Movements: ____________ Start time: ____________ Finish time: ____________  Date: ____________ Movements: ____________ Start time: ____________ Finish time: ____________  Date: ____________ Movements: ____________ Start time: ____________ Finish time: ____________  Date: ____________ Movements: ____________ Start time: ____________ Finish time: ____________  Date: ____________ Movements: ____________ Start time: ____________ Finish time: ____________  Date: ____________ Movements: ____________ Start time: ____________ Finish time: ____________   Date: ____________ Movements: ____________ Start time: ____________ Finish time: ____________  Date: ____________ Movements: ____________ Start time: ____________ Finish time: ____________  Date: ____________ Movements: ____________ Start time: ____________ Finish time: ____________  Date: ____________ Movements: ____________ Start time: ____________ Finish time: ____________  Date: ____________ Movements: ____________ Start time: ____________ Finish time: ____________  Date: ____________ Movements: ____________ Start time: ____________ Finish time: ____________     This information is not intended to replace advice given to you by your health care provider. Make sure you discuss any questions you have with your health care provider.     Document Released: 12/24/2006 Document Revised: 12/15/2014 Document Reviewed: 09/20/2012  Elsevier Interactive Patient Education ©2016 [employer]

## 2016-09-11 NOTE — Telephone Encounter (Signed)
Pt c/o decrease fetal movement, has not felt the baby move 10 x 2 hours. Pt informed to come to office now for evaluation. Pt verbalized understanding.

## 2016-09-15 ENCOUNTER — Encounter: Payer: Self-pay | Admitting: Women's Health

## 2016-09-15 ENCOUNTER — Ambulatory Visit (INDEPENDENT_AMBULATORY_CARE_PROVIDER_SITE_OTHER): Payer: Medicaid Other | Admitting: Women's Health

## 2016-09-15 VITALS — BP 138/78 | HR 80 | Wt 215.0 lb

## 2016-09-15 DIAGNOSIS — Z3A40 40 weeks gestation of pregnancy: Secondary | ICD-10-CM | POA: Diagnosis not present

## 2016-09-15 DIAGNOSIS — Z331 Pregnant state, incidental: Secondary | ICD-10-CM

## 2016-09-15 DIAGNOSIS — Z3403 Encounter for supervision of normal first pregnancy, third trimester: Secondary | ICD-10-CM | POA: Diagnosis not present

## 2016-09-15 DIAGNOSIS — Z1389 Encounter for screening for other disorder: Secondary | ICD-10-CM

## 2016-09-15 LAB — POCT URINALYSIS DIPSTICK
Glucose, UA: NEGATIVE
Ketones, UA: NEGATIVE
Nitrite, UA: NEGATIVE
PROTEIN UA: NEGATIVE

## 2016-09-15 NOTE — Patient Instructions (Signed)
Call the office ([phone]) or go to [facility] if:  You begin to have strong, frequent contractions  Your water breaks.  Sometimes it is a big gush of fluid, sometimes it is just a trickle that keeps getting your panties wet or running down your legs  You have vaginal bleeding.  It is normal to have a small amount of spotting if your cervix was checked.   You don't feel your baby moving like normal.  If you don't, get you something to eat and drink and lay down and focus on feeling your baby move.  You should feel at least 10 movements in 2 hours.  If you don't, you should call the office or go to [facility].    Braxton Hicks Contractions Contractions of the uterus can occur throughout pregnancy. Contractions are not always a sign that you are in labor.  WHAT ARE BRAXTON HICKS CONTRACTIONS?  Contractions that occur before labor are called Braxton Hicks contractions, or false labor. Toward the end of pregnancy (32-34 weeks), these contractions can develop more often and may become more forceful. This is not true labor because these contractions do not result in opening (dilatation) and thinning of the cervix. They are sometimes difficult to tell apart from true labor because these contractions can be forceful and people have different pain tolerances. You should not feel embarrassed if you go to the hospital with false labor. Sometimes, the only way to tell if you are in true labor is for your health care provider to look for changes in the cervix. If there are no prenatal problems or other health problems associated with the pregnancy, it is completely safe to be sent home with false labor and await the onset of true labor. HOW CAN YOU TELL THE DIFFERENCE BETWEEN TRUE AND FALSE LABOR? False Labor  The contractions of false labor are usually shorter and not as hard as those of true labor.   The contractions are usually irregular.   The contractions are often felt in the front of  the lower abdomen and in the groin.   The contractions may go away when you walk around or change positions while lying down.   The contractions get weaker and are shorter lasting as time goes on.   The contractions do not usually become progressively stronger, regular, and closer together as with true labor.  True Labor  Contractions in true labor last 30-70 seconds, become very regular, usually become more intense, and increase in frequency.   The contractions do not go away with walking.   The discomfort is usually felt in the top of the uterus and spreads to the lower abdomen and low back.   True labor can be determined by your health care provider with an exam. This will show that the cervix is dilating and getting thinner.  WHAT TO REMEMBER  Keep up with your usual exercises and follow other instructions given by your health care provider.   Take medicines as directed by your health care provider.   Keep your regular prenatal appointments.   Eat and drink lightly if you think you are going into labor.   If Braxton Hicks contractions are making you uncomfortable:   Change your position from lying down or resting to walking, or from walking to resting.   Sit and rest in a tub of warm water.   Drink 2-3 glasses of water. Dehydration may cause these contractions.   Do slow and deep breathing several times an hour.    WHEN SHOULD I SEEK IMMEDIATE MEDICAL CARE? Seek immediate medical care if:  Your contractions become stronger, more regular, and closer together.   You have fluid leaking or gushing from your vagina.   You have a fever.   You pass blood-tinged mucus.   You have vaginal bleeding.   You have continuous abdominal pain.   You have low back pain that you never had before.   You feel your baby's head pushing down and causing pelvic pressure.   Your baby is not moving as much as it used to.    This information is not intended to  replace advice given to you by your health care provider. Make sure you discuss any questions you have with your health care provider.   Document Released: 11/24/2005 Document Revised: 11/29/2013 Document Reviewed: 09/05/2013 Elsevier Interactive Patient Education 2016 [employer]  

## 2016-09-15 NOTE — Progress Notes (Signed)
Low-risk OB appointment G1P0 5673w2d Estimated Date of Delivery: 09/20/16 BP 138/78   Pulse 80   Wt 215 lb (97.5 kg)   LMP 12/15/2015 (Exact Date)   BMI 39.32 kg/m   BP, weight, and urine reviewed.  Refer to obstetrical flow sheet for FH & FHR.  Reports good fm.  Denies regular uc's, lof, vb, or uti s/s. No complaints. SVE per request: 3.5/70/-2, vtx Offered membrane sweeping, discussed r/b- pt decided to proceed, so membranes swept.  Reviewed labor s/s, fkc. Plan:  Continue routine obstetrical care  F/U in 1wk for OB appointment

## 2016-09-16 ENCOUNTER — Encounter (HOSPITAL_COMMUNITY): Payer: Self-pay | Admitting: *Deleted

## 2016-09-16 ENCOUNTER — Inpatient Hospital Stay (HOSPITAL_COMMUNITY)
Admission: AD | Admit: 2016-09-16 | Discharge: 2016-09-18 | DRG: 775 | Disposition: A | Discharge status: Home / Self Care | Payer: Medicaid Other | Source: Ambulatory Visit | Attending: Family Medicine | Admitting: Family Medicine

## 2016-09-16 DIAGNOSIS — K219 Gastro-esophageal reflux disease without esophagitis: Secondary | ICD-10-CM | POA: Diagnosis present

## 2016-09-16 DIAGNOSIS — Z8249 Family history of ischemic heart disease and other diseases of the circulatory system: Secondary | ICD-10-CM | POA: Diagnosis not present

## 2016-09-16 DIAGNOSIS — A749 Chlamydial infection, unspecified: Secondary | ICD-10-CM

## 2016-09-16 DIAGNOSIS — Z833 Family history of diabetes mellitus: Secondary | ICD-10-CM | POA: Diagnosis not present

## 2016-09-16 DIAGNOSIS — O4202 Full-term premature rupture of membranes, onset of labor within 24 hours of rupture: Secondary | ICD-10-CM | POA: Diagnosis present

## 2016-09-16 DIAGNOSIS — Z3A39 39 weeks gestation of pregnancy: Secondary | ICD-10-CM

## 2016-09-16 DIAGNOSIS — O9962 Diseases of the digestive system complicating childbirth: Secondary | ICD-10-CM | POA: Diagnosis present

## 2016-09-16 HISTORY — DX: Cardiac murmur, unspecified: R01.1

## 2016-09-16 LAB — TYPE AND SCREEN
ABO/RH(D): O POS
ANTIBODY SCREEN: NEGATIVE

## 2016-09-16 LAB — CBC
HEMATOCRIT: 30.5 % — AB (ref 36.0–46.0)
HEMOGLOBIN: 9.7 g/dL — AB (ref 12.0–15.0)
MCH: 23.4 pg — ABNORMAL LOW (ref 26.0–34.0)
MCHC: 31.8 g/dL (ref 30.0–36.0)
MCV: 73.7 fL — ABNORMAL LOW (ref 78.0–100.0)
Platelets: 274 10*3/uL (ref 150–400)
RBC: 4.14 MIL/uL (ref 3.87–5.11)
RDW: 15.3 % (ref 11.5–15.5)
WBC: 10 10*3/uL (ref 4.0–10.5)

## 2016-09-16 LAB — POCT FERN TEST: POCT FERN TEST: POSITIVE

## 2016-09-16 MED ORDER — OXYCODONE-ACETAMINOPHEN 5-325 MG PO TABS: 2.0000 | ORAL_TABLET | ORAL | Status: DC | PRN

## 2016-09-16 MED ORDER — OXYTOCIN BOLUS FROM INFUSION
500.0000 mL | Freq: Once | INTRAVENOUS | Status: AC
  Administered 2016-09-17: 500 mL/h via INTRAVENOUS

## 2016-09-16 MED ORDER — LACTATED RINGERS IV SOLN
INTRAVENOUS | Status: DC
  Administered 2016-09-16 – 2016-09-17 (×2): via INTRAVENOUS

## 2016-09-16 MED ORDER — SOD CITRATE-CITRIC ACID 500-334 MG/5ML PO SOLN: 30.0000 mL | ORAL | Status: DC | PRN

## 2016-09-16 MED ORDER — FLEET ENEMA 7-19 GM/118ML RE ENEM: 1.0000 | ENEMA | RECTAL | Status: DC | PRN

## 2016-09-16 MED ORDER — OXYCODONE-ACETAMINOPHEN 5-325 MG PO TABS: 1.0000 | ORAL_TABLET | ORAL | Status: DC | PRN

## 2016-09-16 MED ORDER — ONDANSETRON HCL 4 MG/2ML IJ SOLN: 4.0000 mg | Freq: Four times a day (QID) | INTRAMUSCULAR | Status: DC | PRN

## 2016-09-16 MED ORDER — LIDOCAINE HCL (PF) 1 % IJ SOLN
30.0000 mL | INTRAMUSCULAR | Status: DC | PRN
  Filled 2016-09-16: qty 30

## 2016-09-16 MED ORDER — LACTATED RINGERS IV SOLN
500.0000 mL | INTRAVENOUS | Status: DC | PRN
  Administered 2016-09-17: 500 mL via INTRAVENOUS

## 2016-09-16 MED ORDER — OXYTOCIN 40 UNITS IN LACTATED RINGERS INFUSION - SIMPLE MED
2.5000 [IU]/h | INTRAVENOUS | Status: DC
Start: 2016-09-16 — End: 2016-09-17
  Filled 2016-09-16: qty 1000

## 2016-09-16 MED ORDER — FENTANYL CITRATE (PF) 100 MCG/2ML IJ SOLN
100.0000 ug | INTRAMUSCULAR | Status: DC | PRN
  Filled 2016-09-16: qty 2

## 2016-09-16 MED ORDER — ACETAMINOPHEN 325 MG PO TABS
650.0000 mg | ORAL_TABLET | ORAL | Status: DC | PRN
  Administered 2016-09-17: 650 mg via ORAL
  Filled 2016-09-16: qty 2

## 2016-09-16 NOTE — MAU Note (Signed)
Pt reports ROM at 2030, no contractions.

## 2016-09-16 NOTE — H&P (Signed)
LABOR AND DELIVERY ADMISSION HISTORY AND PHYSICAL NOTE  ALMAROSA BOHAC is a 20 y.o. female G1P0 with IUP at [redacted]w[redacted]d by LMP/7wk [place] presenting for SROM at 2030 with light meconium present. She states she is having some very mild contractions, but is not in much pain.  She reports positive fetal movement. She denies vaginal bleeding.  She has had occasional headaches throughout her pregnancy. She denies any current headaches. She denies blurry vision or RUQ/epigastric abdominal pain.  Prenatal History/Complications: - Tested positive for Chlamydia at 26 weeks. Her and partner treated with Azithro. Repeat testing was negative.  Past Medical History: Past Medical History:  Diagnosis Date  . Chronic abdominal pain   . DUB (dysfunctional uterine bleeding)     Past Surgical History: Past Surgical History:  Procedure Laterality Date  . APPENDECTOMY    . CHOLECYSTECTOMY      Obstetrical History: OB History    Gravida Para Term Preterm AB Living   1         0   SAB TAB Ectopic Multiple Live Births                  Social History: Social History   Social History  . Marital status: Single    Spouse name: N/A  . Number of children: N/A  . Years of education: N/A   Social History Main Topics  . Smoking status: Never Smoker  . Smokeless tobacco: Never Used  . Alcohol use No  . Drug use: No  . Sexual activity: Yes    Birth control/ protection: None   Other Topics Concern  . None   Social History Narrative  . None    Family History: Family History  Problem Relation Age of Onset  . ADD / ADHD Brother   . Diabetes Maternal Grandmother   . Hypertension Maternal Grandmother     Allergies: No Known Allergies  Prescriptions Prior to Admission  Medication Sig Dispense Refill Last Dose  . Prenatal Multivit-Min-Fe-FA (PRENATAL VITAMINS PO) Take 2 each by mouth daily.   Taking     Review of Systems   All systems reviewed and negative except as stated in HPI  Blood  pressure 131/81, pulse 98, temperature 98.5 F (36.9 C), temperature source Oral, resp. rate 16, height 5\' 2"  (1.575 m), weight 98 kg (216 lb), last menstrual period 12/15/2015, SpO2 97 %. General appearance: alert, cooperative and no distress Lungs: clear to auscultation bilaterally Heart: regular rate and rhythm Abdomen: soft, non-tender; bowel sounds normal Extremities: No calf swelling or tenderness Presentation: cephalic by exam Fetal monitoring: Baseline 150, moderate variability, accelerations present, no decelerations Uterine activity: Mild contractions every 2-3 minutes Dilation: 3.5 Effacement (%): 70 Station: -2 Exam by:: L. Paschal, rN    Prenatal labs: ABO, Rh: O/Positive/-- (03/09 1610) Antibody: Negative (07/17 0915) Rubella: Immune RPR: Non Reactive (07/17 0915)  HBsAg: Negative (03/09 1610)  HIV: Non Reactive (07/17 0915)  GBS: Negative (09/19 0000)  2 hr Glucola: 80/110/103 Genetic screening:  NT/IT neg Anatomy US: Normal female  Prenatal Transfer Tool  Maternal Diabetes: No Genetic Screening: Normal Maternal Ultrasounds/Referrals: Normal Fetal Ultrasounds or other Referrals:  None Maternal Substance Abuse:  No Significant Maternal Medications:  None Significant Maternal Lab Results: Lab values include: Group B Strep negative  Results for orders placed or performed during the hospital encounter of 09/16/16 (from the past 24 hour(s))  Fern Test   Collection Time: 09/16/16  9:32 PM  Result Value Ref Range  POCT Fern Test Positive = ruptured amniotic membanes     Patient Active Problem List   Diagnosis Date Noted  . Diarrhea in late pregnancy 08/06/2016  . Chlamydia 02/20/2016  . Supervision of normal first pregnancy 02/14/2016    Assessment: Griselda MinerDominique E Gene is a 20 y.o. G1P0 at 6468w3d here for SROM (light mec).  #Labor: Having mild contractions every 2-3 minutes. Will see how she progresses spontaneously and augment as needed #Pain: Wants to try  IV pain medications for now. Will decide on epidural later. #FWB: Category I #ID: GBS negative #MOF: Breast #MOC: Nexplanon #Circ:  N/a- girl #BP: Pt had a BP of 147/82 on admission. She endorses occasional headaches throughout pregnancy, but is not currently having a headache. Denies blurred vision, RUQ/epigastric abdominal pain. Will monitor closely.  Jinny BlossomKaty D Mayo 09/16/2016, 9:40 PM

## 2016-09-17 ENCOUNTER — Inpatient Hospital Stay (HOSPITAL_COMMUNITY): Payer: Medicaid Other | Admitting: Anesthesiology

## 2016-09-17 ENCOUNTER — Encounter (HOSPITAL_COMMUNITY): Payer: Self-pay

## 2016-09-17 DIAGNOSIS — K219 Gastro-esophageal reflux disease without esophagitis: Secondary | ICD-10-CM

## 2016-09-17 DIAGNOSIS — O9962 Diseases of the digestive system complicating childbirth: Secondary | ICD-10-CM

## 2016-09-17 DIAGNOSIS — O4202 Full-term premature rupture of membranes, onset of labor within 24 hours of rupture: Secondary | ICD-10-CM

## 2016-09-17 DIAGNOSIS — Z3A39 39 weeks gestation of pregnancy: Secondary | ICD-10-CM

## 2016-09-17 LAB — RPR: RPR: NONREACTIVE

## 2016-09-17 LAB — ABO/RH: ABO/RH(D): O POS

## 2016-09-17 MED ORDER — PRENATAL MULTIVITAMIN CH
1.0000 | ORAL_TABLET | Freq: Every day | ORAL | Status: DC
  Administered 2016-09-18: 1 via ORAL
  Filled 2016-09-17: qty 1

## 2016-09-17 MED ORDER — EPHEDRINE 5 MG/ML INJ
10.0000 mg | INTRAVENOUS | Status: DC | PRN
  Filled 2016-09-17: qty 4

## 2016-09-17 MED ORDER — FENTANYL 2.5 MCG/ML BUPIVACAINE 1/10 % EPIDURAL INFUSION (WH - ANES)
14.0000 mL/h | INTRAMUSCULAR | Status: DC | PRN
  Administered 2016-09-17: 14 mL/h via EPIDURAL

## 2016-09-17 MED ORDER — ONDANSETRON HCL 4 MG PO TABS: 4.0000 mg | ORAL_TABLET | ORAL | Status: DC | PRN

## 2016-09-17 MED ORDER — PHENYLEPHRINE 40 MCG/ML (10ML) SYRINGE FOR IV PUSH (FOR BLOOD PRESSURE SUPPORT)
80.0000 ug | PREFILLED_SYRINGE | INTRAVENOUS | Status: DC | PRN
  Filled 2016-09-17: qty 5

## 2016-09-17 MED ORDER — BENZOCAINE-MENTHOL 20-0.5 % EX AERO: 1.0000 "application " | INHALATION_SPRAY | CUTANEOUS | Status: DC | PRN

## 2016-09-17 MED ORDER — DIPHENHYDRAMINE HCL 50 MG/ML IJ SOLN: 12.5000 mg | INTRAMUSCULAR | Status: DC | PRN

## 2016-09-17 MED ORDER — DIPHENHYDRAMINE HCL 25 MG PO CAPS: 25.0000 mg | ORAL_CAPSULE | Freq: Four times a day (QID) | ORAL | Status: DC | PRN

## 2016-09-17 MED ORDER — TETANUS-DIPHTH-ACELL PERTUSSIS 5-2.5-18.5 LF-MCG/0.5 IM SUSP: 0.5000 mL | Freq: Once | INTRAMUSCULAR | Status: DC

## 2016-09-17 MED ORDER — SIMETHICONE 80 MG PO CHEW: 80.0000 mg | CHEWABLE_TABLET | ORAL | Status: DC | PRN

## 2016-09-17 MED ORDER — LIDOCAINE HCL (PF) 1 % IJ SOLN
INTRAMUSCULAR | Status: DC | PRN
  Administered 2016-09-17: 4 mL via EPIDURAL
  Administered 2016-09-17: 6 mL via EPIDURAL

## 2016-09-17 MED ORDER — ACETAMINOPHEN 325 MG PO TABS: 650.0000 mg | ORAL_TABLET | ORAL | Status: DC | PRN

## 2016-09-17 MED ORDER — ZOLPIDEM TARTRATE 5 MG PO TABS: 5.0000 mg | ORAL_TABLET | Freq: Every evening | ORAL | Status: DC | PRN

## 2016-09-17 MED ORDER — SENNOSIDES-DOCUSATE SODIUM 8.6-50 MG PO TABS: 2.0000 | ORAL_TABLET | ORAL | Status: DC

## 2016-09-17 MED ORDER — PHENYLEPHRINE 40 MCG/ML (10ML) SYRINGE FOR IV PUSH (FOR BLOOD PRESSURE SUPPORT)
PREFILLED_SYRINGE | INTRAVENOUS | Status: AC
  Filled 2016-09-17: qty 20

## 2016-09-17 MED ORDER — ONDANSETRON HCL 4 MG/2ML IJ SOLN: 4.0000 mg | INTRAMUSCULAR | Status: DC | PRN

## 2016-09-17 MED ORDER — IBUPROFEN 600 MG PO TABS
600.0000 mg | ORAL_TABLET | Freq: Four times a day (QID) | ORAL | Status: DC
  Administered 2016-09-17 – 2016-09-18 (×3): 600 mg via ORAL
  Filled 2016-09-17 (×4): qty 1

## 2016-09-17 MED ORDER — FENTANYL 2.5 MCG/ML BUPIVACAINE 1/10 % EPIDURAL INFUSION (WH - ANES)
INTRAMUSCULAR | Status: AC
  Filled 2016-09-17: qty 125

## 2016-09-17 MED ORDER — DIBUCAINE 1 % RE OINT: 1.0000 "application " | TOPICAL_OINTMENT | RECTAL | Status: DC | PRN

## 2016-09-17 MED ORDER — LACTATED RINGERS IV SOLN: 500.0000 mL | Freq: Once | INTRAVENOUS | Status: DC

## 2016-09-17 MED ORDER — COCONUT OIL OIL: 1.0000 "application " | TOPICAL_OIL | Status: DC | PRN

## 2016-09-17 MED ORDER — INFLUENZA VAC SPLIT QUAD 0.5 ML IM SUSY
0.5000 mL | PREFILLED_SYRINGE | INTRAMUSCULAR | Status: AC
  Administered 2016-09-18: 0.5 mL via INTRAMUSCULAR
  Filled 2016-09-17 (×2): qty 0.5

## 2016-09-17 MED ORDER — WITCH HAZEL-GLYCERIN EX PADS: 1.0000 "application " | MEDICATED_PAD | CUTANEOUS | Status: DC | PRN

## 2016-09-17 NOTE — Lactation Note (Signed)
Lactation Consultation Note  Patient Name: Bailey MinerDominique E Robidoux MVHQI'OToday's Date: 09/17/2016 Reason for consult: Initial assessment   With this first time 20 year old mom, in L&D, baby now less than 1 hour old. Mom has baby skin to skin, baby awake and alert. I assisted mom with latching baby in laid back position at first, then cradle and cross cradle. I assisted mom and Ivy for over 30 minutes, with about 3 minutes of actual strong, rhythmic suckles . She was very fussy, and had a stuffy nose. Mom  Has easily expressed colostrum, so the baby was fed multiple drops during this time. I did suck training with baby, and she pulled my finger into her mouth easily, with strong suckle. I think if baby has trouble latching later today, a nipple shield may help to maintain latch.  Mom has very wide spaced , small, soft breasts, flat nipples, and little palpable breast tissue.  Mom knows to call for lactation as needed, and lactation and community resources briefly reviewed with mom.   Maternal Data Formula Feeding for Exclusion: No Has patient been taught Hand Expression?: Yes Does the patient have breastfeeding experience prior to this delivery?: No  Feeding Feeding Type: Breast Fed  LATCH Score/Interventions Latch: Repeated attempts needed to sustain latch, nipple held in mouth throughout feeding, stimulation needed to elicit sucking reflex.  Audible Swallowing: A few with stimulation (multiple drops hand expressed and baby latched after) Intervention(s): Skin to skin;Hand expression;Alternate breast massage  Type of Nipple: Flat  Comfort (Breast/Nipple): Soft / non-tender     Hold (Positioning): Assistance needed to correctly position infant at breast and maintain latch. Intervention(s): Breastfeeding basics reviewed;Position options;Skin to skin  LATCH Score: 6  Lactation Tools Discussed/Used WIC Program: Yes   Consult Status Consult Status: Follow-up Date: 09/18/16 Follow-up type:  In-patient    Alfred LevinsLee, Delene Morais Anne 09/17/2016, 11:59 AM

## 2016-09-17 NOTE — Lactation Note (Signed)
This note was copied from a baby's chart. Lactation Consultation Note  Patient Name: Bailey Palmer CZYSA'YToday's Date: 09/17/2016 Reason for consult: Follow-up assessment Baby at 10 hr of life. Mom reports baby has fed x2 since birth. She thinks when the baby is awake she latches well. She denies breast or nipple pain. She declined latch help at this visit because baby was sleeping. She was agreeable to using DEBP. Discussed baby behavior, feeding frequency, baby belly size, voids, wt loss, breast changes, and nipple care. Demonstrated manual expression, colostrum noted bilaterally, spoon in room. She will call lactation at next bf. She is aware of lactation services and support group.     Maternal Data Has patient been taught Hand Expression?: Yes Does the patient have breastfeeding experience prior to this delivery?: No  Feeding Feeding Type: Breast Fed Length of feed: 0 min  LATCH Score/Interventions Latch: Too sleepy or reluctant, no latch achieved, no sucking elicited. Intervention(s): Skin to skin;Teach feeding cues;Waking techniques  Audible Swallowing: None Intervention(s): Skin to skin                 Lactation Tools Discussed/Used WIC Program: Yes Pump Review: Setup, frequency, and cleaning;Milk Storage Initiated by:: ES Date initiated:: 09/17/16   Consult Status Consult Status: Follow-up Date: 09/18/16 Follow-up type: In-patient    Bailey Palmer 09/17/2016, 9:14 PM

## 2016-09-17 NOTE — Progress Notes (Signed)
Patient ID: Bailey Palmer, female   DOB: 12/09/1995, 20 y.o.   MRN: [id number]  Labor Progress Note Bailey Palmer is a 20 y.o. G1P0 at 3025w4d presented for SROM  S: feeling pressure, like she has to poop  O:  BP (!) 119/56   Pulse (!) 108   Temp 99.8 F (37.7 C) (Oral)   Resp 18   Ht 5\' 2"  (1.575 m)   Wt 216 lb (98 kg)   LMP 12/15/2015 (Exact Date)   SpO2 97%   BMI 39.51 kg/m  EFM: 150/mod/+accels, no decels  CVE: Dilation: 10 Dilation Complete Date: 09/17/16 Dilation Complete Time: 0955 Effacement (%): 100 Cervical Position: Anterior Station: +1 Presentation: Vertex Exam by:: Lyndel SafeKimberly Newton MD   A&P: 20 y.o. G1P0 725w4d here in active labor #Labor: Progressing well- peanut ball for 30 minutes then can start pushing #Pain: epidural #FWB: Cat I #GBS negative. Having mild temp axillary 100.8  Federico FlakeKimberly Niles Newton, MD 9:56 AM

## 2016-09-17 NOTE — Anesthesia Postprocedure Evaluation (Signed)
Anesthesia Post Note  Patient: Bailey Palmer  Procedure(s) Performed: * No procedures listed *  Patient location during evaluation: Mother Baby Anesthesia Type: Epidural Level of consciousness: awake, awake and alert, oriented and patient cooperative Pain management: pain level controlled Vital Signs Assessment: post-procedure vital signs reviewed and stable Respiratory status: spontaneous breathing, nonlabored ventilation and respiratory function stable Cardiovascular status: stable Postop Assessment: no headache, no backache, no signs of nausea or vomiting and patient able to bend at knees Anesthetic complications: no     Last Vitals:  Vitals:   09/17/16 1257 09/17/16 1334  BP:  136/81  Pulse:  (!) 105  Resp:    Temp: 37.4 C 37.2 C    Last Pain:  Vitals:   09/17/16 1334  TempSrc: Oral  PainSc: 0-No pain   Pain Goal: Patients Stated Pain Goal: 0 (09/16/16 2234)               Loralei Radcliffe L

## 2016-09-17 NOTE — Anesthesia Procedure Notes (Signed)
Epidural Patient location during procedure: OB  Staffing Anesthesiologist: Linton RumpALLAN, Kelley Knoth DICKERSON Performed: anesthesiologist   Preanesthetic Checklist Completed: patient identified, surgical consent, pre-op evaluation, timeout performed, IV checked, risks and benefits discussed and monitors and equipment checked  Epidural Patient position: sitting Prep: site prepped and draped and DuraPrep Patient monitoring: continuous pulse ox and blood pressure Approach: midline Location: L2-L3 Injection technique: LOR air  Needle:  Needle type: Tuohy  Needle gauge: 17 G Needle length: 9 cm and 9 Needle insertion depth: 7 cm Catheter type: closed end flexible Catheter size: 19 Gauge Catheter at skin depth: 12 cm Test dose: negative  Assessment Events: blood not aspirated, injection not painful, no injection resistance, negative IV test and no paresthesia  Additional Notes LOR obtained on second attempt but catheter would not thread. Moved to another space and obtained LOR with easy placement of catheter. Patient noted improvement in analgesia.Reason for block:procedure for pain

## 2016-09-17 NOTE — Anesthesia Preprocedure Evaluation (Signed)
Anesthesia Evaluation  Patient identified by MRN, date of birth, ID band Patient awake    Reviewed: Allergy & Precautions, NPO status , Patient's Chart, lab work & pertinent test results  History of Anesthesia Complications Negative for: history of anesthetic complications  Airway Mallampati: I  TM Distance: >3 FB Neck ROM: Full    Dental  (+) Teeth Intact   Pulmonary neg pulmonary ROS,    Pulmonary exam normal breath sounds clear to auscultation       Cardiovascular (-) hypertension+ Valvular Problems/Murmurs  Rhythm:Regular Rate:Normal     Neuro/Psych negative neurological ROS     GI/Hepatic Neg liver ROS, GERD  ,  Endo/Other  negative endocrine ROS  Renal/GU negative Renal ROS     Musculoskeletal   Abdominal (+) + obese,   Peds  Hematology negative hematology ROS (+)   Anesthesia Other Findings   Reproductive/Obstetrics (+) Pregnancy                             Anesthesia Physical Anesthesia Plan  ASA: II  Anesthesia Plan: Epidural   Post-op Pain Management:    Induction:   Airway Management Planned: Natural Airway  Additional Equipment:   Intra-op Plan:   Post-operative Plan:   Informed Consent: I have reviewed the patients History and Physical, chart, labs and discussed the procedure including the risks, benefits and alternatives for the proposed anesthesia with the patient or authorized representative who has indicated his/her understanding and acceptance.     Plan Discussed with:   Anesthesia Plan Comments: (I have discussed risks of neuraxial anesthesia including but not limited to infection, bleeding, nerve injury, back pain, headache, seizures, and failure of block. Patient denies bleeding disorders and is not currently anticoagulated. Labs have been reviewed. Risks and benefits discussed. All patient's questions answered.   Platelets 274)         Anesthesia Quick Evaluation

## 2016-09-17 NOTE — Progress Notes (Signed)
Delivery of live viable female by Dr Wonda Oldsiccio Assisted by Dr Trula OreSchneck APGARS 7,9

## 2016-09-18 ENCOUNTER — Ambulatory Visit: Payer: Self-pay

## 2016-09-18 MED ORDER — IBUPROFEN 600 MG PO TABS: 600.0000 mg | ORAL_TABLET | Freq: Four times a day (QID) | ORAL | 0 refills | Status: DC | PRN

## 2016-09-18 NOTE — Progress Notes (Signed)
Patient ID: Bailey MinerDominique E Shelton, female   DOB: 1996-08-30, 20 y.o.   MRN: [id number]  POSTPARTUM PROGRESS NOTE  Post Partum Day 1  Subjective:  Bailey Palmer is a 20 y.o. G1P1001 8194w4d s/p SVD yesterday morning.  No acute events overnight.  Pt denies problems with ambulating, voiding or po intake.  She denies nausea or vomiting.  Pain is well controlled.  She has had flatus. She has had bowel movement.  Lochia Minimal.   Objective: Blood pressure 95/70, pulse 73, temperature 97.8 F (36.6 C), temperature source Oral, resp. rate 18, height 5\' 2"  (1.575 m), weight 98 kg (216 lb), last menstrual period 12/15/2015, SpO2 97 %, unknown if currently breastfeeding.  Physical Exam:  General: alert, cooperative and no distress Lochia:normal flow Chest: CTAB Heart: RRR no m/r/g Abdomen: +BS, soft, nontender,  Uterine Fundus: firm, below level of umbilicus DVT Evaluation: No calf swelling or tenderness Extremities: without edema   Recent Labs  09/16/16 2148  HGB 9.7*  HCT 30.5*    Assessment/Plan:  ASSESSMENT: Bailey MinerDominique E Wachob is a 20 y.o. G1P1001 6094w4d s/p SVD  Plan for discharge tomorrow and Contraception nexplanon as outpatient   LOS: 2 days   Tillman Sersngela C Riccio, DO 09/18/2016, 7:47 AM   I have seen and examined this patient and I agree with the above. Pt would like to be discharged today- will plan on this unless infant needs to stay. Cam HaiSHAW, Naithen Rivenburg CNM 9:36 AM 09/18/2016

## 2016-09-18 NOTE — Lactation Note (Signed)
This note was copied from a baby's chart. Lactation Consultation Note  Patient Name: Bailey Palmer ZOXWR'UToday's Date: 09/18/2016 Reason for consult: Follow-up assessment Baby at 30 hr of life. Mom reports bilateral nipple soreness, no skin break down noted. Upon entry baby was latched. With breast compressions, a few swallows were noted. Mom has pumped x2 today. She is getting drops that she rubs on baby's lips. She is manually expressing into baby's mouth to get her to latch. Mom will offer the breast 8+/24hr and f/u each bf with manual expression and spoon feeding until the baby has a stool. Discussed the possibility of supplementing with formula until mom is making a larger volume of milk. Mom is aware of lactation services and support group. She will call as needed.    Maternal Data    Feeding Feeding Type: Breast Fed  LATCH Score/Interventions    Audible Swallowing: A few with stimulation Intervention(s): Hand expression     Comfort (Breast/Nipple): Filling, red/small blisters or bruises, mild/mod discomfort  Problem noted: Mild/Moderate discomfort Interventions (Mild/moderate discomfort): Hand expression        Lactation Tools Discussed/Used     Consult Status Consult Status: Follow-up Date: 09/19/16 Follow-up type: In-patient    Rulon Eisenmengerlizabeth E Arlow Spiers 09/18/2016, 5:21 PM

## 2016-09-18 NOTE — Progress Notes (Signed)
UR chart review completed.  

## 2016-09-18 NOTE — Discharge Instructions (Signed)

## 2016-09-19 ENCOUNTER — Ambulatory Visit: Payer: Self-pay

## 2016-09-19 NOTE — Lactation Note (Signed)
This note was copied from a baby's chart. Lactation Consultation Note  Baby 3349 hours old. Mother had finished a 10 min feeding on L breast when IBCLC entered room. Baby cueing.  Suggest baby latch on R side. Suggest average feeding at this age would be 30-45 min per feeding. Mom encouraged to feed baby 8-12 times/24 hours and with feeding cues.  Mother latched baby easily in cradle hold.  Sucks and swallows heard and observed initially. Encouraged mother to compress/massage breast during feeding to keep baby active. Reviewed pg 24 in Baby & Me booklet - voids/stool chart and discussed stool transitioning and what to look for. Provided mother with hand pump and reviewed lactation phone number. Discussed engorgement care.      Patient Name: Bailey Palmer Reason for consult: Follow-up assessment   Maternal Data    Feeding Feeding Type: Breast Fed Length of feed: 10 min  LATCH Score/Interventions Latch: Grasps breast easily, tongue down, lips flanged, rhythmical sucking.  Audible Swallowing: Spontaneous and intermittent  Type of Nipple: Everted at rest and after stimulation  Comfort (Breast/Nipple): Soft / non-tender  Problem noted: Mild/Moderate discomfort  Hold (Positioning): Assistance needed to correctly position infant at breast and maintain latch.  LATCH Score: 9  Lactation Tools Discussed/Used     Consult Status Consult Status: Complete    Hardie PulleyBerkelhammer, Fionna Merriott [name], 12:01 PM

## 2016-09-19 NOTE — Discharge Summary (Signed)
OB Discharge Summary     Patient Name: Bailey Palmer DOB: Apr 24, 1996 MRN: [id number]  Date of admission: 09/16/2016 Delivering MD: Bailey Palmer   Date of discharge: 09/19/2016  Admitting diagnosis: 39w water broke, 4 cm  Intrauterine pregnancy: 1043w4d     Secondary diagnosis:  Active Problems:   Rupture of membranes with meconium present  Additional problems: none     Discharge diagnosis: Term Pregnancy Delivered                                                                                                Post partum procedures:none  Augmentation: none  Complications: None  Hospital course:  Onset of Labor With Vaginal Delivery     20 y.o. yo G1P1001 at 3143w4d was admitted in Latent Labor on 09/16/2016. Patient had an uncomplicated labor course as follows:  Membrane Rupture Time/Date: 8:30 PM ,09/16/2016   Intrapartum Procedures: Episiotomy: None [1]                                         Lacerations:  1st degree [2];Perineal [11]  Patient had a delivery of a Viable infant. 09/17/2016  Information for the patient's newborn:  Bailey Palmer [[id number]][[id number]]  Delivery Method: Vaginal, Spontaneous Delivery (Filed from Delivery Summary)    Pateint had an uncomplicated postpartum course.  She is ambulating, tolerating a regular diet, passing flatus, and urinating well. Patient is discharged home in stable condition on 09/18/16, however may room in if infant is unable to be discharged.    Physical exam Vitals:   09/17/16 1257 09/17/16 1334 09/17/16 1740 09/18/16 0552  BP:  136/81 127/80 95/70  Pulse:  (!) 105 (!) 104 73  Resp:    18  Temp: 99.4 F (37.4 C) 98.9 F (37.2 C) 98 F (36.7 C) 97.8 F (36.6 C)  TempSrc:  Oral  Oral  SpO2:      Weight:      Height:       General: alert and cooperative Lochia: appropriate Uterine Fundus: firm Incision: N/A DVT Evaluation: No evidence of DVT seen on physical exam. Labs: Lab Results  Component Value  Date   WBC 10.0 09/16/2016   HGB 9.7 (L) 09/16/2016   HCT 30.5 (L) 09/16/2016   MCV 73.7 (L) 09/16/2016   PLT 274 09/16/2016   CMP Latest Ref Rng & Units 01/04/2015  Glucose 70 - 99 mg/dL 132(G101(H)  BUN 6 - 23 mg/dL 17  Creatinine 4.010.50 - 0.271.10 mg/dL 2.530.54  Sodium 664135 - 403145 mmol/L 138  Potassium 3.5 - 5.1 mmol/L 3.2(L)  Chloride 96 - 112 mmol/L 107  CO2 19 - 32 mmol/L 25  Calcium 8.4 - 10.5 mg/dL 8.8  Total Protein 6.0 - 8.3 g/dL 6.9  Total Bilirubin 0.3 - 1.2 mg/dL 4.7(Q1.3(H)  Alkaline Phos 39 - 117 U/L 70  AST 0 - 37 U/L 32  ALT 0 - 35 U/L 51(H)    Discharge instruction: per After Visit Summary and "Baby and  Me Booklet".  After visit meds:    Medication List    TAKE these medications   ibuprofen 600 MG tablet Commonly known as:  ADVIL,MOTRIN Take 1 tablet (600 mg total) by mouth every 6 (six) hours as needed.   PRENATAL VITAMINS PO Take 2 each by mouth daily.       Diet: routine diet  Activity: Advance as tolerated. Pelvic rest for 6 weeks.   Outpatient follow up:6 weeks Follow up Appt:Future Appointments Date Time Provider Department Center  10/27/2016 11:00 AM Cheral Marker, CNM [facility]   Follow up Visit:No Follow-up on file.  Postpartum contraception: Nexplanon  Newborn Data: Live born female  Birth Weight: 8 lb 2 oz (3685 g) APGAR: 7, 9  Baby Feeding: Breast Disposition:rooming in   [place], CNM 09/18/16

## 2016-09-22 ENCOUNTER — Encounter: Payer: Medicaid Other | Admitting: Women's Health

## 2016-10-27 ENCOUNTER — Encounter: Payer: Self-pay | Admitting: Obstetrics & Gynecology

## 2016-10-27 ENCOUNTER — Ambulatory Visit (INDEPENDENT_AMBULATORY_CARE_PROVIDER_SITE_OTHER): Payer: Medicaid Other | Admitting: Obstetrics & Gynecology

## 2016-10-27 NOTE — Progress Notes (Signed)
Subjective:     Bailey Palmer is a 20 y.o. female who presents for a postpartum visit. She is 6 weeks postpartum following a spontaneous vaginal delivery. I have fully reviewed the prenatal and intrapartum course. The delivery was at 39 gestational weeks. Outcome: spontaneous vaginal delivery. Anesthesia: epidural. Postpartum course has been normal. Baby's course has been normal. Baby is feeding by breast. Bleeding no bleeding. Bowel function is normal. Bladder function is normal. Patient is sexually active. Contraception method is none. Postpartum depression screening: negative.  The following portions of the patient's history were reviewed and updated as appropriate: allergies, current medications, past family history, past medical history, past social history, past surgical history and problem list.  Review of Systems Pertinent items are noted in HPI.   Objective:    BP 110/64 (BP Location: Left Arm, Patient Position: Sitting, Cuff Size: Normal)   Pulse 78   Wt 187 lb (84.8 kg)   Breastfeeding? Yes   BMI 34.20 kg/m   General:  alert, cooperative and no distress   Breasts:    Lungs:   Heart:    Abdomen: soft, non-tender; bowel sounds normal; no masses,  no organomegaly   Vulva:  normal  Vagina: normal vagina  Cervix:  no cervical motion tenderness and no lesions  Corpus: normal size, contour, position, consistency, mobility, non-tender  Adnexa:  normal adnexa  Rectal Exam: Normal rectovaginal exam        Assessment:     normal postpartum exam. Pap smear not done at today's visit.   Plan:    1. Contraception: wants Nexplanon 2. Had sex yesteray, instructed not to have sex, will come in for quantitative HCG only December 4th and have nexplanon placed the next day 3. Follow up in: 2 weeks or as needed.

## 2016-11-03 ENCOUNTER — Encounter (INDEPENDENT_AMBULATORY_CARE_PROVIDER_SITE_OTHER): Payer: Self-pay | Admitting: Internal Medicine

## 2016-11-11 ENCOUNTER — Encounter: Payer: Medicaid Other | Admitting: Obstetrics & Gynecology

## 2016-11-11 LAB — BETA HCG QUANT (REF LAB)

## 2016-11-14 ENCOUNTER — Ambulatory Visit (INDEPENDENT_AMBULATORY_CARE_PROVIDER_SITE_OTHER): Payer: Medicaid Other | Admitting: Obstetrics & Gynecology

## 2016-11-14 ENCOUNTER — Encounter: Payer: Self-pay | Admitting: Obstetrics & Gynecology

## 2016-11-14 VITALS — BP 120/80 | HR 78 | Wt 186.0 lb

## 2016-11-14 DIAGNOSIS — Z3202 Encounter for pregnancy test, result negative: Secondary | ICD-10-CM | POA: Diagnosis not present

## 2016-11-14 DIAGNOSIS — Z30017 Encounter for initial prescription of implantable subdermal contraceptive: Secondary | ICD-10-CM | POA: Diagnosis not present

## 2016-11-14 DIAGNOSIS — Z3049 Encounter for surveillance of other contraceptives: Secondary | ICD-10-CM | POA: Diagnosis not present

## 2016-11-14 LAB — POCT URINE PREGNANCY: Preg Test, Ur: NEGATIVE

## 2016-11-14 NOTE — Progress Notes (Signed)
  Nexplanon Insertion Note:  G1P1001  UPT: done: negative  Pt presents desiring placement of LARC in the form of Nexplanon She has been counseled regarding various methods including OCP, nuva ring, levonorgestrel IUD, Depo Provera injections and Nexplanon.  She has chosen Nexplanon and understands its primary unpleasant side effect of irregular bleeding.  The left arm is inspected and is appropriate for placement. The area is prepped with betadine. 3 cc 1% lidocaine is injected at the proposed injection site.  A small stab incision is made with a #11 blade. The Nexplanon device is then inserted into the previously anesthetized area. It is released without difficulty. It is palpable by both patient and myself. There is minimal bleeding.  The stab incision is reapproximated with 3 steri strips. A wrap gauze dressing is placed which patient will leave in place for 2 days or so.

## 2016-11-20 ENCOUNTER — Ambulatory Visit (INDEPENDENT_AMBULATORY_CARE_PROVIDER_SITE_OTHER): Payer: Medicaid Other | Admitting: Internal Medicine

## 2016-12-02 ENCOUNTER — Emergency Department (HOSPITAL_COMMUNITY)
Admission: EM | Admit: 2016-12-02 | Discharge: 2016-12-02 | Disposition: A | Discharge status: Home / Self Care | Payer: Medicaid Other | Attending: Dermatology | Admitting: Dermatology

## 2016-12-02 ENCOUNTER — Encounter (HOSPITAL_COMMUNITY): Payer: Self-pay | Admitting: Emergency Medicine

## 2016-12-02 DIAGNOSIS — R109 Unspecified abdominal pain: Secondary | ICD-10-CM | POA: Insufficient documentation

## 2016-12-02 DIAGNOSIS — Z5321 Procedure and treatment not carried out due to patient leaving prior to being seen by health care provider: Secondary | ICD-10-CM | POA: Insufficient documentation

## 2016-12-02 DIAGNOSIS — R112 Nausea with vomiting, unspecified: Secondary | ICD-10-CM | POA: Insufficient documentation

## 2016-12-02 DIAGNOSIS — R197 Diarrhea, unspecified: Secondary | ICD-10-CM | POA: Insufficient documentation

## 2016-12-02 LAB — COMPREHENSIVE METABOLIC PANEL
ALBUMIN: 4.2 g/dL (ref 3.5–5.0)
ALK PHOS: 84 U/L (ref 38–126)
ALT: 83 U/L — AB (ref 14–54)
AST: 28 U/L (ref 15–41)
Anion gap: 6 (ref 5–15)
BUN: 8 mg/dL (ref 6–20)
CALCIUM: 8.6 mg/dL — AB (ref 8.9–10.3)
CHLORIDE: 104 mmol/L (ref 101–111)
CO2: 25 mmol/L (ref 22–32)
CREATININE: 0.55 mg/dL (ref 0.44–1.00)
GFR calc Af Amer: >60 mL/min (ref >60)
GFR calc non Af Amer: >60 mL/min (ref >60)
GLUCOSE: 106 mg/dL — AB (ref 65–99)
Potassium: 3.3 mmol/L — ABNORMAL LOW (ref 3.5–5.1)
SODIUM: 135 mmol/L (ref 135–145)
Total Bilirubin: 0.9 mg/dL (ref 0.3–1.2)
Total Protein: 7.4 g/dL (ref 6.5–8.1)

## 2016-12-02 LAB — CBC
HCT: 32.5 % — ABNORMAL LOW (ref 36.0–46.0)
Hemoglobin: 10.5 g/dL — ABNORMAL LOW (ref 12.0–15.0)
MCH: 24.8 pg — AB (ref 26.0–34.0)
MCHC: 32.3 g/dL (ref 30.0–36.0)
MCV: 76.7 fL — AB (ref 78.0–100.0)
PLATELETS: 296 10*3/uL (ref 150–400)
RBC: 4.24 MIL/uL (ref 3.87–5.11)
RDW: 16.7 % — ABNORMAL HIGH (ref 11.5–15.5)
WBC: 10.2 10*3/uL (ref 4.0–10.5)

## 2016-12-02 LAB — LIPASE, BLOOD: LIPASE: 21 U/L (ref 11–51)

## 2016-12-02 NOTE — ED Notes (Signed)
Call from registration saying pt left.

## 2016-12-02 NOTE — ED Triage Notes (Signed)
Pt states she started having abdominal pain with n/v/d at 1500 today.

## 2017-02-12 ENCOUNTER — Encounter: Payer: Medicaid Other | Admitting: Women's Health

## 2017-02-27 ENCOUNTER — Encounter: Payer: Medicaid Other | Admitting: Adult Health

## 2017-03-17 ENCOUNTER — Encounter (HOSPITAL_COMMUNITY): Payer: Self-pay | Admitting: Emergency Medicine

## 2017-03-17 ENCOUNTER — Emergency Department (HOSPITAL_COMMUNITY)
Admission: EM | Admit: 2017-03-17 | Discharge: 2017-03-17 | Disposition: A | Discharge status: Home / Self Care | Payer: Medicaid Other | Attending: Emergency Medicine | Admitting: Emergency Medicine

## 2017-03-17 DIAGNOSIS — L0501 Pilonidal cyst with abscess: Secondary | ICD-10-CM | POA: Diagnosis not present

## 2017-03-17 MED ORDER — IBUPROFEN 600 MG PO TABS: 600.0000 mg | ORAL_TABLET | Freq: Four times a day (QID) | ORAL | 0 refills | Status: DC

## 2017-03-17 MED ORDER — LIDOCAINE-EPINEPHRINE (PF) 2 %-1:200000 IJ SOLN
10.0000 mL | Freq: Once | INTRAMUSCULAR | Status: AC
  Administered 2017-03-17: 10 mL
  Filled 2017-03-17: qty 20

## 2017-03-17 MED ORDER — ONDANSETRON HCL 4 MG PO TABS
4.0000 mg | ORAL_TABLET | Freq: Once | ORAL | Status: AC
  Administered 2017-03-17: 4 mg via ORAL
  Filled 2017-03-17: qty 1

## 2017-03-17 MED ORDER — HYDROCODONE-ACETAMINOPHEN 7.5-325 MG PO TABS
1.0000 | ORAL_TABLET | ORAL | 0 refills | Status: DC | PRN
Start: 2017-03-17 — End: 2017-04-23

## 2017-03-17 MED ORDER — IBUPROFEN 800 MG PO TABS
800.0000 mg | ORAL_TABLET | Freq: Once | ORAL | Status: AC
  Administered 2017-03-17: 800 mg via ORAL
  Filled 2017-03-17: qty 1

## 2017-03-17 MED ORDER — DOXYCYCLINE HYCLATE 100 MG PO CAPS: 100.0000 mg | ORAL_CAPSULE | Freq: Two times a day (BID) | ORAL | 0 refills | Status: DC

## 2017-03-17 MED ORDER — HYDROCODONE-ACETAMINOPHEN 5-325 MG PO TABS
2.0000 | ORAL_TABLET | Freq: Once | ORAL | Status: AC
  Administered 2017-03-17: 2 via ORAL
  Filled 2017-03-17: qty 2

## 2017-03-17 MED ORDER — DOXYCYCLINE HYCLATE 100 MG PO TABS
100.0000 mg | ORAL_TABLET | Freq: Once | ORAL | Status: AC
  Administered 2017-03-17: 100 mg via ORAL
  Filled 2017-03-17: qty 1

## 2017-03-17 NOTE — ED Provider Notes (Signed)
[facility] Provider Note   CSN: [id number] Arrival date & time: 03/17/17  2034     History   Chief Complaint Chief Complaint  Patient presents with  . Abscess    HPI Bailey Palmer is a 21 y.o. female.  Patient is a 21 year old female who presents to the emergency department with complaint of abscess in the upper buttocks area.  The patient states that she has had problems with abscess formation in this area in the past. She had an incision and drainage done about 6 or 7 months ago. She states she has been doing fine up until about 2-3 days ago when she began to have swelling and pain at the upper portion of the buttocks. Right, greater than left. She has not had fever or chills reported. The area is not draining. The patient states she was seen by her primary care earlier today. They gave her antibiotics but would not drain the area on. She presents at this time because she feels area needs to be drained.   The history is provided by the patient.    Past Medical History:  Diagnosis Date  . Chronic abdominal pain   . DUB (dysfunctional uterine bleeding)   . Heart murmur    in [employer]    Patient Active Problem List   Diagnosis Date Noted  . Chlamydia 02/20/2016    Past Surgical History:  Procedure Laterality Date  . APPENDECTOMY    . CHOLECYSTECTOMY      OB History    Gravida Para Term Preterm AB Living   SAB TAB Ectopic Multiple Live Births         0 1       Home Medications    Prior to Admission medications   Not on File    Family History Family History  Problem Relation Age of Onset  . Asthma Mother   . Miscarriages / [place] Mother   . Varicose Veins Mother   . ADD / ADHD Brother   . Diabetes Maternal Grandmother   . Hypertension Maternal Grandmother   . Heart disease Maternal Grandmother   . Asthma Maternal Aunt     Social History Social History  Substance Use Topics  . Smoking status: Never Smoker  .  Smokeless tobacco: Never Used  . Alcohol use No     Allergies   Patient has no known allergies.   Review of Systems Review of Systems  Skin:       abscess     Physical Exam Updated Vital Signs BP 128/70   Pulse 85   Temp 98.4 F (36.9 C)   Resp 18   Ht  (1.575 m)   Wt 92.5 kg   SpO2 98%   BMI 37.31 kg/m   Physical Exam  Constitutional: She is oriented to person, place, and time. She appears well-developed and well-nourished.  Non-toxic appearance.  HENT:  Head: Normocephalic.  Right Ear: Tympanic membrane and external ear normal.  Left Ear: Tympanic membrane and external ear normal.  Eyes: EOM and lids are normal. Pupils are equal, round, and reactive to light.  Neck: Normal range of motion. Neck supple. Carotid bruit is not present.  Cardiovascular: Normal rate, regular rhythm, normal heart sounds, intact distal pulses and normal pulses.   Pulmonary/Chest: Breath sounds normal. No respiratory distress.  Abdominal: Soft. Bowel sounds are normal. There is no tenderness. There is no guarding.  Musculoskeletal: Normal range  of motion.  Lymphadenopathy:       Head (right side): No submandibular adenopathy present.       Head (left side): No submandibular adenopathy present.    She has no cervical adenopathy.  Neurological: She is alert and oriented to person, place, and time. She has normal strength. No cranial nerve deficit or sensory deficit.  Skin: Skin is warm and dry.     Psychiatric: She has a normal mood and affect. Her speech is normal.  Nursing note and vitals reviewed.    ED Treatments / Results  Labs (all labs ordered are listed, but only abnormal results are displayed) Labs Reviewed  AEROBIC CULTURE (SUPERFICIAL SPECIMEN)    EKG  EKG Interpretation None       Radiology No results found.  Procedures .Marland KitchenIncision and Drainage Date/Time: 03/17/2017 9:31 PM Performed by: Ivery Quale Authorized by: Ivery Quale   Consent:     Consent obtained:  Verbal   Risks discussed:  Bleeding, incomplete drainage and pain   Alternatives discussed:  Referral Location:    Type:  Pilonidal cyst   Location:  Anogenital   Anogenital location:  Pilonidal Pre-procedure details:    Skin preparation:  Betadine Procedure type:    Complexity:  Simple Procedure details:    Incision types:  Single straight   Incision depth:  Subcutaneous   Scalpel blade:  11   Wound management:  Probed and deloculated and irrigated with saline   Drainage:  Purulent   Drainage amount:  Moderate   Wound treatment:  Wound left open Post-procedure details:    Patient tolerance of procedure:  Tolerated well, no immediate complications   (including critical care time)  Medications Ordered in ED Medications  lidocaine-EPINEPHrine (XYLOCAINE W/EPI) 2 %-1:200000 (PF) injection 10 mL (not administered)  ibuprofen (ADVIL,MOTRIN) tablet 800 mg (not administered)  HYDROcodone-acetaminophen (NORCO/VICODIN) 5-325 MG per tablet 2 tablet (not administered)  doxycycline (VIBRA-TABS) tablet 100 mg (not administered)  ondansetron (ZOFRAN) tablet 4 mg (not administered)     Initial Impression / Assessment and Plan / ED Course  I have reviewed the triage vital signs and the nursing notes.  Pertinent labs & imaging results that were available during my care of the patient were reviewed by me and considered in my medical decision making (see chart for details).     *I have reviewed nursing notes, vital signs, and all appropriate lab and imaging results for this patient.  Final Clinical Impressions(s) / ED Diagnoses MDM Vital signs within normal limits. The patient has a pilonidal cyst present. Incision and drainage was carried out. I explained to the patient however that she would need surgical consultation to fully treat this problem. Questions were answered. The patient acknowledges understanding of the surgical follow-up. She still requests to have the  wound incised and drained tonight.  Incision and drainage was carried out. Culture was sent to the lab. Patient is placed on doxycycline, ibuprofen, and Norco. The patient will use warm tub soaks. She will call Dr. Lovell Sheehan for surgical evaluation and management.    Final diagnoses:  Pilonidal abscess    New Prescriptions New Prescriptions   DOXYCYCLINE (VIBRAMYCIN) 100 MG CAPSULE    Take 1 capsule (100 mg total) by mouth 2 (two) times daily.   HYDROCODONE-ACETAMINOPHEN (NORCO) 7.5-325 MG TABLET    Take 1 tablet by mouth every 4 (four) hours as needed.   IBUPROFEN (ADVIL,MOTRIN) 600 MG TABLET    Take 1 tablet (600 mg total) by mouth 4 (four)  times daily.     Ivery Quale, PA-C 03/17/17 2139    Maia Plan, MD 03/18/17 1028

## 2017-03-17 NOTE — ED Triage Notes (Signed)
Pt c/o abscess to the right upper buttocks and was seen at pcp for the same. Pt states she wants the area drained.

## 2017-03-17 NOTE — Discharge Instructions (Signed)
Starting tomorrow please soak in a warm tub of absent salt baths. Do this daily until wound heals from the inside out. Please use doxycycline and ibuprofen daily with food. May use Norco for pain. Norco may cause drowsiness, please do not drive, drink alcohol, operate machinery, or participate in activities requiring concentration when taking this medication. Please use a pad a bandage to the incision site daily. See your primary physician, or return to the emergency department if any high fevers, repeated vomiting, red streaks from the incision site, or deterioration in your general condition.

## 2017-03-20 LAB — AEROBIC CULTURE W GRAM STAIN (SUPERFICIAL SPECIMEN): Culture: NORMAL

## 2017-03-20 LAB — AEROBIC CULTURE  (SUPERFICIAL SPECIMEN): SPECIAL REQUESTS: NORMAL

## 2017-04-02 ENCOUNTER — Encounter: Payer: Self-pay | Admitting: General Surgery

## 2017-04-02 ENCOUNTER — Ambulatory Visit (INDEPENDENT_AMBULATORY_CARE_PROVIDER_SITE_OTHER): Payer: Medicaid Other | Admitting: General Surgery

## 2017-04-02 VITALS — BP 121/63 | HR 77 | Temp 98.9°F | Resp 18 | Ht 62.0 in | Wt 208.0 lb

## 2017-04-02 DIAGNOSIS — L0591 Pilonidal cyst without abscess: Secondary | ICD-10-CM

## 2017-04-02 NOTE — Progress Notes (Signed)
Bailey Palmer; [id number]; [birth year]   HPI  patient is a 21 year old female who is referred for evaluation and treatment of her second episode of a pilonidal cyst infection.  She has had 2 over the past 7 months.  Her last episode was treated with antibiotics.  She was referred by the ER for further evaluation and treatment.  She currently has no pain or drainage from the pilonidal cyst area. Past Medical History:  Diagnosis Date  . Chronic abdominal pain   . DUB (dysfunctional uterine bleeding)   . Heart murmur    in [employer]    Past Surgical History:  Procedure Laterality Date  . APPENDECTOMY    . CHOLECYSTECTOMY      Family History  Problem Relation Age of Onset  . Asthma Mother   . Miscarriages / [place] Mother   . Varicose Veins Mother   . ADD / ADHD Brother   . Diabetes Maternal Grandmother   . Hypertension Maternal Grandmother   . Heart disease Maternal Grandmother   . Asthma Maternal Aunt     Current Outpatient Prescriptions on File Prior to Visit  Medication Sig Dispense Refill  . doxycycline (VIBRAMYCIN) 100 MG capsule Take 1 capsule (100 mg total) by mouth 2 (two) times daily. 14 capsule 0  . HYDROcodone-acetaminophen (NORCO) 7.5-325 MG tablet Take 1 tablet by mouth every 4 (four) hours as needed. 15 tablet 0  . ibuprofen (ADVIL,MOTRIN) 600 MG tablet Take 1 tablet (600 mg total) by mouth 4 (four) times daily. 30 tablet 0   No current facility-administered medications on file prior to visit.     No Known Allergies  History  Alcohol Use No    History  Smoking Status  . Never Smoker  Smokeless Tobacco  . Never Used    Review of Systems  Constitutional: Negative.   HENT: Negative.   Eyes: Negative.   Respiratory: Negative.   Cardiovascular: Negative.   Gastrointestinal: Negative.   Genitourinary: Negative.   Musculoskeletal: Negative.   Skin: Negative.   Neurological: Negative.   Endo/Heme/Allergies: Negative.   Psychiatric/Behavioral:  Negative.     Objective   Vitals:   04/02/17 1040  BP: 121/63  Pulse: 77  Resp: 18  Temp: 98.9 F (37.2 C)    Physical Exam  Constitutional: She is well-developed, well-nourished, and in no distress.  HENT:  Head: Normocephalic and atraumatic.  Neck: Normal range of motion. Neck supple.  Cardiovascular: Normal rate, regular rhythm and normal heart sounds.   No murmur heard. Pulmonary/Chest: Effort normal and breath sounds normal. She has no wheezes. She has no rales.  Skin: Skin is warm and dry. No rash noted. No erythema.  Area of minimal induration from the coccyx down the right upper buttock cheek.  No drainage noted.  No erythema noted.  Vitals reviewed.    ER note reviewed Assessment   pilonidal cyst Plan    patient will call to schedule excision of the pilonidal cyst.  Risks and benefits of the procedure including bleeding, infection, wound breakdown, the possibility of recurrence of the cyst were fully explained to the patient, who gave informed consent.

## 2017-04-02 NOTE — Patient Instructions (Signed)
Incision and Drainage of a Pilonidal Cyst Incision and drainage is a surgical procedure to open and drain a fluid-filled sac that forms around a hair follicle in the tailbone area between your buttocks (pilonidal cyst). You may need this procedure if the cyst becomes painful, swollen, or infected. There are three types of procedures that may be done. The type of procedure you have depends on the size and severity of your infected cyst. The procedure may be:  Incision and drainage with a special type of bandage (wound packing). Packing is used for wounds that are deep or tunnel under the skin.  Marsupialization. In this procedure, the cyst will be opened and kept open. The edges of the incision will be stitched together to make a pocket.  Incision and drainage without wound packing. Tell a health care provider about:  Any allergies you have.  All medicines you are taking, including vitamins, herbs, eye drops, creams, and over-the-counter medicines.  Any problems you or family members have had with anesthetic medicines.  Any blood disorders you have.  Any surgeries you have had.  Any medical conditions you have. What are the risks? Generally, this is a safe procedure. However, problems can occur and include:  Infection.  Bleeding.  Having another cyst develop.  Need for more surgery. What happens before the procedure?  Ask your health care provider about:  Changing or stopping your regular medicines. This is especially important if you are taking diabetes medicines or blood thinners.  Taking medicines such as aspirin and ibuprofen. These medicines can thin your blood. Do not take these medicines before your procedure if your health care provider tells you not to.  Taking antibiotics before surgery to control the infection.  Do not eat or drink anything for 6?8 hours before the procedure if you are having general anesthesia.  Take a shower the night before the procedure to  clean your buttocks area. Take another shower in the morning before surgery.  Plan to have someone take you home after the procedure. What happens during the procedure?  You will have an IV tube inserted in a vein in your hand or arm.  You will be given one of the following:  A medicine that numbs the area (local anesthetic).  A medicine that makes you go to sleep (general anesthetic).  You also may be given medicine to help you relax during the procedure (sedative).  You will lie face down on the operating table.  Your buttocks area may be shaved.  Tape may be used to spread your buttocks.  Germ-killing solution (antiseptic) may be used to clean the area. Incision and Drainage With Wound Packing:   Your surgeon will make a surgical cut (incision) over the cyst to open it.  A probe may be used to see if there are tunnels extending away from the cyst under your skin.  Fluid or pus inside the cyst will be drained.  The cyst will be flushed out with a germ-free (sterile) solution.  Packing will be placed into the open cyst. This keeps it open and draining after surgery.  The area will be covered with a bandage (dressing). Marsupialization:   Your surgeon will make a surgical cut (incision) over the cyst to open it.  A probe may be used to see if there are tunnels extending away from the cyst under your skin.  Fluid or pus inside the cyst will be drained.  The cyst will be flushed out with a germ-free (sterile) solution.  The edges of the incision will be stitched (sutured) to the skin to keep it wide open. The cyst will not be packed.  A rolled-up bandage (dressing) will be taped over the incision. Incision and Drainage Without Packing:   Your surgeon will make a surgical cut (incision) over the cyst to open it.  A probe may be used to see if there are tunnels extending away from the cyst under your skin.  Fluid or pus inside the cyst will be drained.  The cyst  will be flushed out with a germ-free (sterile) solution.  Your surgeon may also remove the tissue around the opened cyst.  The incision then will be closed with stitches (sutures). It will not be left open, and packing will not be used.  A bandage (dressing) will be put over the incision area. What happens after the procedure?  If you had general anesthesia, you will be taken to a recovery area. Your blood pressure, heart rate, breathing rate, and blood oxygen level will be monitored often until the medicines you were given have worn off.  It is normal to have some pain after this procedure. You may be given pain medicine.  Your IV tube can be taken out after you have recovered and your pain is under control. This information is not intended to replace advice given to you by your health care provider. Make sure you discuss any questions you have with your health care provider. Document Released: 06/21/2014 Document Revised: 05/01/2016 Document Reviewed: 04/13/2014 [employer] Interactive Patient Education  2017 [employer] Pilonidal Cyst A pilonidal cyst is a fluid-filled sac. It forms beneath the skin near your tailbone, at the top of the crease of your buttocks. A pilonidal cyst that is not large or infected may not cause symptoms or problems. If the cyst becomes irritated or infected, it may fill with pus. This causes pain and swelling (pilonidal abscess). An infected cyst may need to be treated with medicine, drained, or removed. What are the causes? The cause of a pilonidal cyst is not known. One cause may be a hair that grows into your skin (ingrown hair). What increases the risk? Pilonidal cysts are more common in boys and men. Risk factors include:  Having lots of hair near the crease of the buttocks.  Being overweight.  Having a pilonidal dimple.  Wearing tight clothing.  Not bathing or showering frequently.  Sitting for long periods of time. What are the signs or  symptoms? Signs and symptoms of a pilonidal cyst may include:  Redness.  Pain and tenderness.  Warmth.  Swelling.  Pus.  Fever. How is this diagnosed? Your health care provider may diagnose a pilonidal cyst based on your symptoms and a physical exam. The health care provider may do a blood test to check for infection. If your cyst is draining pus, your health care provider may take a sample of the drainage to be tested at a laboratory. How is this treated? Surgery is the usual treatment for an infected pilonidal cyst. You may also have to take medicines before surgery. The type of surgery you have depends on the size and severity of the infected cyst. The different kinds of surgery include:  Incision and drainage. This is a procedure to open and drain the cyst.  Marsupialization. In this procedure, a large cyst or abscess may be opened and kept open by stitching the edges of the skin to the cyst walls.  Cyst removal. This procedure involves opening the skin  and removing all or part of the cyst. Follow these instructions at home:  Follow all of your surgeon's instructions carefully if you had surgery.  Take medicines only as directed by your health care provider.  If you were prescribed an antibiotic medicine, finish it all even if you start to feel better.  Keep the area around your pilonidal cyst clean and dry.  Clean the area as directed by your health care provider. Pat the area dry with a clean towel. Do not rub it as this may cause bleeding.  Remove hair from the area around the cyst as directed by your health care provider.  Do not wear tight clothing or sit in one place for long periods of time.  There are many different ways to close and cover an incision, including stitches, skin glue, and adhesive strips. Follow your health care provider's instructions on:  Incision care.  Bandage (dressing) changes and removal.  Incision closure removal. Contact a health care  provider if:  You have drainage, redness, swelling, or pain at the site of the cyst.  You have a fever. This information is not intended to replace advice given to you by your health care provider. Make sure you discuss any questions you have with your health care provider. Document Released: 11/21/2000 Document Revised: 05/01/2016 Document Reviewed: 04/13/2014 Elsevier Interactive Patient Education  2017 [employer].

## 2017-04-16 NOTE — Patient Instructions (Signed)
Bailey Palmer  04/16/2017     @PREFPERIOPPHARMACY @   Your procedure is scheduled on  04/24/2017   [facility] at  [facility]  [facility].M.  Call this number if you have problems the morning of surgery:  [phone]   Remember:  Do not eat food or drink liquids after midnight.  Take these medicines the morning of surgery with A SIP OF WATER  Hydrocodone.   Do not wear jewelry, make-up or nail polish.  Do not wear lotions, powders, or perfumes, or deoderant.  Do not shave 48 hours prior to surgery.  Men may shave face and neck.  Do not bring valuables to the hospital.  [facility] is not responsible for any belongings or valuables.  Contacts, dentures or bridgework may not be worn into surgery.  Leave your suitcase in the car.  After surgery it may be brought to your room.  For patients admitted to the hospital, discharge time will be determined by your treatment team.  Patients discharged the day of surgery will not be allowed to drive home.   Name and phone number of your driver:   family Special instructions:  None  Please read over the following fact sheets that you were given. Anesthesia Post-op Instructions and Care and Recovery After Surgery      Incision and Drainage of a Pilonidal Cyst Incision and drainage is a surgical procedure to open and drain a fluid-filled sac that forms around a hair follicle in the tailbone area between your buttocks (pilonidal cyst). You may need this procedure if the cyst becomes painful, swollen, or infected. There are three types of procedures that may be done. The type of procedure you have depends on the size and severity of your infected cyst. The procedure may be:  Incision and drainage with a special type of bandage (wound packing). Packing is used for wounds that are deep or tunnel under the skin.  Marsupialization. In this procedure, the cyst will be opened and kept open. The edges of the incision will be stitched  together to make a pocket.  Incision and drainage without wound packing. Tell a health care provider about:  Any allergies you have.  All medicines you are taking, including vitamins, herbs, eye drops, creams, and over-the-counter medicines.  Any problems you or family members have had with anesthetic medicines.  Any blood disorders you have.  Any surgeries you have had.  Any medical conditions you have. What are the risks? Generally, this is a safe procedure. However, problems can occur and include:  Infection.  Bleeding.  Having another cyst develop.  Need for more surgery. What happens before the procedure?  Ask your health care provider about:  Changing or stopping your regular medicines. This is especially important if you are taking diabetes medicines or blood thinners.  Taking medicines such as aspirin and ibuprofen. These medicines can thin your blood. Do not take these medicines before your procedure if your health care provider tells you not to.  Taking antibiotics before surgery to control the infection.  Do not eat or drink anything for 6?8 hours before the procedure if you are having general anesthesia.  Take a shower the night before the procedure to clean your buttocks area. Take another shower in the morning before surgery.  Plan to have someone take you home after the procedure. What happens during the procedure?  You will have an IV tube inserted in a  vein in your hand or arm.  You will be given one of the following:  A medicine that numbs the area (local anesthetic).  A medicine that makes you go to sleep (general anesthetic).  You also may be given medicine to help you relax during the procedure (sedative).  You will lie face down on the operating table.  Your buttocks area may be shaved.  Tape may be used to spread your buttocks.  Germ-killing solution (antiseptic) may be used to clean the area. Incision and Drainage With Wound Packing:    Your surgeon will make a surgical cut (incision) over the cyst to open it.  A probe may be used to see if there are tunnels extending away from the cyst under your skin.  Fluid or pus inside the cyst will be drained.  The cyst will be flushed out with a germ-free (sterile) solution.  Packing will be placed into the open cyst. This keeps it open and draining after surgery.  The area will be covered with a bandage (dressing). Marsupialization:   Your surgeon will make a surgical cut (incision) over the cyst to open it.  A probe may be used to see if there are tunnels extending away from the cyst under your skin.  Fluid or pus inside the cyst will be drained.  The cyst will be flushed out with a germ-free (sterile) solution.  The edges of the incision will be stitched (sutured) to the skin to keep it wide open. The cyst will not be packed.  A rolled-up bandage (dressing) will be taped over the incision. Incision and Drainage Without Packing:   Your surgeon will make a surgical cut (incision) over the cyst to open it.  A probe may be used to see if there are tunnels extending away from the cyst under your skin.  Fluid or pus inside the cyst will be drained.  The cyst will be flushed out with a germ-free (sterile) solution.  Your surgeon may also remove the tissue around the opened cyst.  The incision then will be closed with stitches (sutures). It will not be left open, and packing will not be used.  A bandage (dressing) will be put over the incision area. What happens after the procedure?  If you had general anesthesia, you will be taken to a recovery area. Your blood pressure, heart rate, breathing rate, and blood oxygen level will be monitored often until the medicines you were given have worn off.  It is normal to have some pain after this procedure. You may be given pain medicine.  Your IV tube can be taken out after you have recovered and your pain is under  control. This information is not intended to replace advice given to you by your health care provider. Make sure you discuss any questions you have with your health care provider. Document Released: 06/21/2014 Document Revised: 05/01/2016 Document Reviewed: 04/13/2014 Elsevier Interactive Patient Education  2017 [employer] Incision and Drainage of a Pilonidal Cyst, Care After Refer to this sheet in the next few weeks. These instructions provide you with information on caring for yourself after your procedure. Your health care provider may also give you more specific instructions. Your treatment has been planned according to current medical practices, but problems sometimes occur. Call your health care provider if you have any problems or questions after your procedure. What can I expect after the procedure? After your procedure, it is typical to have the following:  Pain near or at the  surgical area.  Blood-tinged discharge on your wound packing or your bandage (dressing). Follow these instructions at home:  Take medicines only as directed by your health care provider.  If you were prescribed an antibiotic medicine, finish it all even if you start to feel better.  To prevent constipation:  Drink enough fluid to keep your urine clear or pale yellow.  Include lots of whole grains, fruits, and vegetables in your diet.  Do not do activities that irritate or put pressure on your buttocks for about 2 weeks or as directed by your health care provider. These include bike riding, running, and anything that involves a twisting motion.  Do not sit for long periods of time.  Sleep on your side instead of your back.  Ask your health care provider when you can return to work and resume your usual activities.  Wear loose, cotton underwear.  Keep all follow-up visits as directed by your health care provider. This is important. If you had a surgical cut (incision) and drainage with wound  packing:   Return to your health care provider as instructed to have your packing changed or removed.  Keep the incision area dry until your packing has been removed.  After the packing has been removed, you can start taking showers or baths.  Clean your buttocks area with soap and water.  Pat the area dry with a soft, clean towel. If you had a marsupialization procedure:   You can start taking showers or baths the day after surgery.  Let the water from the shower or bath moisten your dressing before you remove it.  After your shower or bath, pat your buttocks area dry with a soft, clean towel and replace your dressing.  Ask your health care provider:  When you can stop using a dressing.  When you can start taking showers or baths. If you had a surgical cut (incision) and drainage without packing:  Follow instructions from your health care provider about how to take care of your incision. Make sure you:  Wash your hands with soap and water before you change your bandage (dressing). If soap and water are not available, use hand sanitizer.  Change your dressing as told by your health care provider.  Leave stitches (sutures), skin glue, or adhesive strips in place. These skin closures may need to stay in place for 2 weeks or longer. If adhesive strip edges start to loosen and curl up, you may trim the loose edges. Do not remove adhesive strips completely unless your health care provider tells you to do that. Contact a health care provider if:  Your incision is bleeding.  You have signs of infection at your incision or around the incision. Watch for:  Drainage.  Redness.  Swelling.  Pain.  There is a bad smell coming from your incision site.  Your pain medicine is not helping.  You have a fever or chills.  You have muscles aches.  You are dizzy.  You feel generally ill. This information is not intended to replace advice given to you by your health care provider.  Make sure you discuss any questions you have with your health care provider. Document Released: 12/25/2006 Document Revised: 05/01/2016 Document Reviewed: 04/13/2014 Elsevier Interactive Patient Education  2017 Else[employer] Inc.  General Anesthesia, Adult General anesthesia is the use of medicines to make a person "go to sleep" (be unconscious) for a medical procedure. General anesthesia is often recommended when a procedure:  Is long.  Requires you  to be still or in an unusual position.  Is major and can cause you to lose blood.  Is impossible to do without general anesthesia. The medicines used for general anesthesia are called general anesthetics. In addition to making you sleep, the medicines:  Prevent pain.  Control your blood pressure.  Relax your muscles. Tell a health care provider about:  Any allergies you have.  All medicines you are taking, including vitamins, herbs, eye drops, creams, and over-the-counter medicines.  Any problems you or family members have had with anesthetic medicines.  Types of anesthetics you have had in the past.  Any bleeding disorders you have.  Any surgeries you have had.  Any medical conditions you have.  Any history of heart or lung conditions, such as heart failure, sleep apnea, or chronic obstructive pulmonary disease (COPD).  Whether you are pregnant or may be pregnant.  Whether you use tobacco, alcohol, marijuana, or street drugs.  Any history of Financial planner.  Any history of depression or anxiety. What are the risks? Generally, this is a safe procedure. However, problems may occur, including:  Allergic reaction to anesthetics.  Lung and heart problems.  Inhaling food or liquids from your stomach into your lungs (aspiration).  Injury to nerves.  Waking up during your procedure and being unable to move (rare).  Extreme agitation or a state of mental confusion (delirium) when you wake up from the anesthetic.  Air in  the bloodstream, which can lead to stroke. These problems are more likely to develop if you are having a major surgery or if you have an advanced medical condition. You can prevent some of these complications by answering all of your health care provider's questions thoroughly and by following all pre-procedure instructions. General anesthesia can cause side effects, including:  Nausea or vomiting  A sore throat from the breathing tube.  Feeling cold or shivery.  Feeling tired, washed out, or achy.  Sleepiness or drowsiness.  Confusion or agitation. What happens before the procedure? Staying hydrated  Follow instructions from your health care provider about hydration, which may include:  Up to 2 hours before the procedure - you may continue to drink clear liquids, such as water, clear fruit juice, black coffee, and plain tea. Eating and drinking restrictions  Follow instructions from your health care provider about eating and drinking, which may include:  8 hours before the procedure - stop eating heavy meals or foods such as meat, fried foods, or fatty foods.  6 hours before the procedure - stop eating light meals or foods, such as toast or cereal.  6 hours before the procedure - stop drinking milk or drinks that contain milk.  2 hours before the procedure - stop drinking clear liquids. Medicines   Ask your health care provider about:  Changing or stopping your regular medicines. This is especially important if you are taking diabetes medicines or blood thinners.  Taking medicines such as aspirin and ibuprofen. These medicines can thin your blood. Do not take these medicines before your procedure if your health care provider instructs you not to.  Taking new dietary supplements or medicines. Do not take these during the week before your procedure unless your health care provider approves them.  If you are told to take a medicine or to continue taking a medicine on the day of  the procedure, take the medicine with sips of water. General instructions    Ask if you will be going home the same day, the following  day, or after a longer hospital stay.  Plan to have someone take you home.  Plan to have someone stay with you for the first 24 hours after you leave the hospital or clinic.  For 3-6 weeks before the procedure, try not to use any tobacco products, such as cigarettes, chewing tobacco, and e-cigarettes.  You may brush your teeth on the morning of the procedure, but make sure to spit out the toothpaste. What happens during the procedure?  You will be given anesthetics through a mask and through an IV tube in one of your veins.  You may receive medicine to help you relax (sedative).  As soon as you are asleep, a breathing tube may be used to help you breathe.  An anesthesia specialist will stay with you throughout the procedure. He or she will help keep you comfortable and safe by continuing to give you medicines and adjusting the amount of medicine that you get. He or she will also watch your blood pressure, pulse, and oxygen levels to make sure that the anesthetics do not cause any problems.  If a breathing tube was used to help you breathe, it will be removed before you wake up. The procedure may vary among health care providers and hospitals. What happens after the procedure?  You will wake up, often slowly, after the procedure is complete, usually in a recovery area.  Your blood pressure, heart rate, breathing rate, and blood oxygen level will be monitored until the medicines you were given have worn off.  You may be given medicine to help you calm down if you feel anxious or agitated.  If you will be going home the same day, your health care provider may check to make sure you can stand, drink, and urinate.  Your health care providers will treat your pain and side effects before you go home.  Do not drive for 24 hours if you received a  sedative.  You may:  Feel nauseous and vomit.  Have a sore throat.  Have mental slowness.  Feel cold or shivery.  Feel sleepy.  Feel tired.  Feel sore or achy, even in parts of your body where you did not have surgery. This information is not intended to replace advice given to you by your health care provider. Make sure you discuss any questions you have with your health care provider. Document Released: 03/02/2008 Document Revised: 05/06/2016 Document Reviewed: 11/08/2015 Elsevier Interactive Patient Education  2017 [employer] Inc. General Anesthesia, Adult, Care After These instructions provide you with information about caring for yourself after your procedure. Your health care provider may also give you more specific instructions. Your treatment has been planned according to current medical practices, but problems sometimes occur. Call your health care provider if you have any problems or questions after your procedure. What can I expect after the procedure? After the procedure, it is common to have:  Vomiting.  A sore throat.  Mental slowness. It is common to feel:  Nauseous.  Cold or shivery.  Sleepy.  Tired.  Sore or achy, even in parts of your body where you did not have surgery. Follow these instructions at home: For at least 24 hours after the procedure:   Do not:  Participate in activities where you could fall or become injured.  Drive.  Use heavy machinery.  Drink alcohol.  Take sleeping pills or medicines that cause drowsiness.  Make important decisions or sign legal documents.  Take care of children on your own.  Rest. Eating and drinking   If you vomit, drink water, juice, or soup when you can drink without vomiting.  Drink enough fluid to keep your urine clear or pale yellow.  Make sure you have little or no nausea before eating solid foods.  Follow the diet recommended by your health care provider. General instructions   Have a  responsible adult stay with you until you are awake and alert.  Return to your normal activities as told by your health care provider. Ask your health care provider what activities are safe for you.  Take over-the-counter and prescription medicines only as told by your health care provider.  If you smoke, do not smoke without supervision.  Keep all follow-up visits as told by your health care provider. This is important. Contact a health care provider if:  You continue to have nausea or vomiting at home, and medicines are not helpful.  You cannot drink fluids or start eating again.  You cannot urinate after 8-12 hours.  You develop a skin rash.  You have fever.  You have increasing redness at the site of your procedure. Get help right away if:  You have difficulty breathing.  You have chest pain.  You have unexpected bleeding.  You feel that you are having a life-threatening or urgent problem. This information is not intended to replace advice given to you by your health care provider. Make sure you discuss any questions you have with your health care provider. Document Released: 03/02/2001 Document Revised: 04/28/2016 Document Reviewed: 11/08/2015 Elsevier Interactive Patient Education  2017 [employer].

## 2017-04-21 ENCOUNTER — Encounter (HOSPITAL_COMMUNITY)
Admission: RE | Admit: 2017-04-21 | Discharge: 2017-04-21 | Disposition: A | Discharge status: Home / Self Care | Payer: Medicaid Other | Source: Ambulatory Visit | Attending: General Surgery | Admitting: General Surgery

## 2017-04-21 ENCOUNTER — Encounter (HOSPITAL_COMMUNITY): Payer: Self-pay

## 2017-04-21 DIAGNOSIS — Z01812 Encounter for preprocedural laboratory examination: Secondary | ICD-10-CM | POA: Diagnosis present

## 2017-04-21 DIAGNOSIS — R011 Cardiac murmur, unspecified: Secondary | ICD-10-CM | POA: Diagnosis not present

## 2017-04-21 DIAGNOSIS — N938 Other specified abnormal uterine and vaginal bleeding: Secondary | ICD-10-CM | POA: Insufficient documentation

## 2017-04-21 DIAGNOSIS — D649 Anemia, unspecified: Secondary | ICD-10-CM | POA: Diagnosis not present

## 2017-04-21 HISTORY — DX: Anemia, unspecified: D64.9

## 2017-04-21 LAB — CBC WITH DIFFERENTIAL/PLATELET
BASOS PCT: 0 %
Basophils Absolute: 0 10*3/uL (ref 0.0–0.1)
EOS ABS: 0.1 10*3/uL (ref 0.0–0.7)
EOS PCT: 2 %
HEMATOCRIT: 33.8 % — AB (ref 36.0–46.0)
Hemoglobin: 10.8 g/dL — ABNORMAL LOW (ref 12.0–15.0)
Lymphocytes Relative: 33 %
Lymphs Abs: 2.5 10*3/uL (ref 0.7–4.0)
MCH: 24.9 pg — ABNORMAL LOW (ref 26.0–34.0)
MCHC: 32 g/dL (ref 30.0–36.0)
MCV: 77.9 fL — ABNORMAL LOW (ref 78.0–100.0)
Monocytes Absolute: 0.5 10*3/uL (ref 0.1–1.0)
Monocytes Relative: 7 %
Neutro Abs: 4.5 10*3/uL (ref 1.7–7.7)
Neutrophils Relative %: 58 %
Platelets: 302 10*3/uL (ref 150–400)
RBC: 4.34 MIL/uL (ref 3.87–5.11)
RDW: 15.5 % (ref 11.5–15.5)
WBC: 7.7 10*3/uL (ref 4.0–10.5)

## 2017-04-21 LAB — HCG, SERUM, QUALITATIVE: PREG SERUM: NEGATIVE

## 2017-04-21 LAB — BASIC METABOLIC PANEL
Anion gap: 5 (ref 5–15)
BUN: 11 mg/dL (ref 6–20)
CALCIUM: 8.9 mg/dL (ref 8.9–10.3)
CO2: 26 mmol/L (ref 22–32)
CREATININE: 0.41 mg/dL — AB (ref 0.44–1.00)
Chloride: 106 mmol/L (ref 101–111)
GFR calc non Af Amer: >60 mL/min (ref >60)
Glucose, Bld: 98 mg/dL (ref 65–99)
Potassium: 3.8 mmol/L (ref 3.5–5.1)
SODIUM: 137 mmol/L (ref 135–145)

## 2017-04-21 NOTE — H&P (Signed)
Bailey Palmer; [id number]; 01/12/1996   HPI  patient is a 21 year old female who is referred for evaluation and treatment of her second episode of a pilonidal cyst infection.  She has had 2 over the past 7 months.  Her last episode was treated with antibiotics.  She was referred by the ER for further evaluation and treatment.  She currently has no pain or drainage from the pilonidal cyst area.     Past Medical History:  Diagnosis Date  . Chronic abdominal pain   . DUB (dysfunctional uterine bleeding)   . Heart murmur    in [employer]         Past Surgical History:  Procedure Laterality Date  . APPENDECTOMY    . CHOLECYSTECTOMY           Family History  Problem Relation Age of Onset  . Asthma Mother   . Miscarriages / [place] Mother   . Varicose Veins Mother   . ADD / ADHD Brother   . Diabetes Maternal Grandmother   . Hypertension Maternal Grandmother   . Heart disease Maternal Grandmother   . Asthma Maternal Aunt           Current Outpatient Prescriptions on File Prior to Visit  Medication Sig Dispense Refill  . doxycycline (VIBRAMYCIN) 100 MG capsule Take 1 capsule (100 mg total) by mouth 2 (two) times daily. 14 capsule 0  . HYDROcodone-acetaminophen (NORCO) 7.5-325 MG tablet Take 1 tablet by mouth every 4 (four) hours as needed. 15 tablet 0  . ibuprofen (ADVIL,MOTRIN) 600 MG tablet Take 1 tablet (600 mg total) by mouth 4 (four) times daily. 30 tablet 0   No current facility-administered medications on file prior to visit.     No Known Allergies     History  Alcohol Use No       History  Smoking Status  . Never Smoker  Smokeless Tobacco  . Never Used    Review of Systems  Constitutional: Negative.   HENT: Negative.   Eyes: Negative.   Respiratory: Negative.   Cardiovascular: Negative.   Gastrointestinal: Negative.   Genitourinary: Negative.   Musculoskeletal: Negative.   Skin: Negative.   Neurological: Negative.    Endo/Heme/Allergies: Negative.   Psychiatric/Behavioral: Negative.     Objective      Vitals:   04/02/17 1040  BP: 121/63  Pulse: 77  Resp: 18  Temp: 98.9 F (37.2 C)    Physical Exam  Constitutional: She is well-developed, well-nourished, and in no distress.  HENT:  Head: Normocephalic and atraumatic.  Neck: Normal range of motion. Neck supple.  Cardiovascular: Normal rate, regular rhythm and normal heart sounds.   No murmur heard. Pulmonary/Chest: Effort normal and breath sounds normal. She has no wheezes. She has no rales.  Skin: Skin is warm and dry. No rash noted. No erythema.  Area of minimal induration from the coccyx down the right upper buttock cheek.  No drainage noted.  No erythema noted.  Vitals reviewed.    ER note reviewed Assessment   pilonidal cyst Plan    patient will call to schedule excision of the pilonidal cyst.  Risks and benefits of the procedure including bleeding, infection, wound breakdown, the possibility of recurrence of the cyst were fully explained to the patient, who gave informed consent.

## 2017-04-23 ENCOUNTER — Encounter: Payer: Self-pay | Admitting: Women's Health

## 2017-04-23 ENCOUNTER — Ambulatory Visit (INDEPENDENT_AMBULATORY_CARE_PROVIDER_SITE_OTHER): Payer: Medicaid Other | Admitting: Women's Health

## 2017-04-23 VITALS — BP 100/58 | HR 89 | Ht 63.0 in | Wt 213.0 lb

## 2017-04-23 DIAGNOSIS — Z30011 Encounter for initial prescription of contraceptive pills: Secondary | ICD-10-CM

## 2017-04-23 DIAGNOSIS — Z3049 Encounter for surveillance of other contraceptives: Secondary | ICD-10-CM | POA: Diagnosis not present

## 2017-04-23 DIAGNOSIS — Z3046 Encounter for surveillance of implantable subdermal contraceptive: Secondary | ICD-10-CM

## 2017-04-23 MED ORDER — NORETHIN-ETH ESTRAD-FE BIPHAS 1 MG-10 MCG / 10 MCG PO TABS: 1.0000 | ORAL_TABLET | Freq: Every day | ORAL | 3 refills | Status: DC

## 2017-04-23 NOTE — Patient Instructions (Signed)
Keep the area clean and dry.  You can remove the big bandage in 24 hours, and the small steri-strip bandage in 3-5 days.  A back up method, such as condoms, should be used for two weeks.    Oral Contraception Use Oral contraceptive pills (OCPs) are medicines taken to prevent pregnancy. OCPs work by preventing the ovaries from releasing eggs. The hormones in OCPs also cause the cervical mucus to thicken, preventing the sperm from entering the uterus. The hormones also cause the uterine lining to become thin, not allowing a fertilized egg to attach to the inside of the uterus. OCPs are highly effective when taken exactly as prescribed. However, OCPs do not prevent sexually transmitted diseases (STDs). Safe sex practices, such as using condoms along with an OCP, can help prevent STDs. Before taking OCPs, you may have a physical exam and Pap test. Your health care provider may also order blood tests if necessary. Your health care provider will make sure you are a good candidate for oral contraception. Discuss with your health care provider the possible side effects of the OCP you may be prescribed. When starting an OCP, it can take 2 to 3 months for the body to adjust to the changes in hormone levels in your body. How to take oral contraceptive pills Your health care provider may advise you on how to start taking the first cycle of OCPs. Otherwise, you can:  Start on day 1 of your menstrual period. You will not need any backup contraceptive protection with this start time.  Start on the first Sunday after your menstrual period or the day you get your prescription. In these cases, you will need to use backup contraceptive protection for the first week.  Start the pill at any time of your cycle. If you take the pill within 5 days of the start of your period, you are protected against pregnancy right away. In this case, you will not need a backup form of birth control. If you start at any other time of your  menstrual cycle, you will need to use another form of birth control for 7 days. If your OCP is the type called a minipill, it will protect you from pregnancy after taking it for 2 days (48 hours). After you have started taking OCPs:  If you forget to take 1 pill, take it as soon as you remember. Take the next pill at the regular time.  If you miss 2 or more pills, call your health care provider because different pills have different instructions for missed doses. Use backup birth control until your next menstrual period starts.  If you use a 28-day pack that contains inactive pills and you miss 1 of the last 7 pills (pills with no hormones), it will not matter. Throw away the rest of the non-hormone pills and start a new pill pack. No matter which day you start the OCP, you will always start a new pack on that same day of the week. Have an extra pack of OCPs and a backup contraceptive method available in case you miss some pills or lose your OCP pack. Follow these instructions at home:  Do not smoke.  Always use a condom to protect against STDs. OCPs do not protect against STDs.  Use a calendar to mark your menstrual period days.  Read the information and directions that came with your OCP. Talk to your health care provider if you have questions. Contact a health care provider if:  You develop  vomiting.  You have abnormal vaginal discharge or bleeding.  You develop a rash.  You miss your menstrual period.  You are losing your hair.  You need treatment for mood swings or depression.  You get dizzy when taking the OCP.  You develop acne from taking the OCP.  You become pregnant. Get help right away if:  You develop chest pain.  You develop shortness of breath.  You have an uncontrolled or severe headache.  You develop numbness or slurred speech.  You develop visual problems.  You develop pain, redness, and swelling in the legs. This information is not  intended to replace advice given to you by your health care provider. Make sure you discuss any questions you have with your health care provider. Document Released: 11/13/2011 Document Revised: 05/01/2016 Document Reviewed: 05/15/2013 Elsevier Interactive Patient Education  2017 Elsevier Inc.  

## 2017-04-23 NOTE — Progress Notes (Signed)
Bailey MinerDominique E Vanrossum is a 21 y.o. year old female here for Nexplanon removal.  Patient given informed consent for removal of her Nexplanon. Nexplanon was placed 11/14/16. States she doesn't like not having period b/c she feels like it's just building up inside- discussed this is not the case, reviewed how progestin thins endometrium/nothing to shed. Weight gain. Feels 'weird' in arm and hurts at times. Just wants it out. Reviewed all other contraception options, wants coc's. Does not smoke, no h/o HTN, DVT/PE, CVA, MI, or migraines w/ aura. Bottlefeeding.   BP (!) 100/58 (BP Location: Left Arm, Patient Position: Sitting, Cuff Size: Large)   Pulse 89   Ht 5\' 3"  (1.6 m)   Wt 213 lb (96.6 kg)   LMP 04/15/2017 (Exact Date)   Breastfeeding? No   BMI 37.73 kg/m   Appropriate time out taken. Nexplanon site identified.  Area prepped in usual sterile fashon. One cc of 2% lidocaine was used to anesthetize the area at the distal end of the implant. A small stab incision was made right beside the implant on the distal portion.  The Nexplanon rod was grasped using hemostats and removed without difficulty.  There was less than 3 cc blood loss. There were no complications.  Steri-strips were applied over the small incision and a pressure bandage was applied.  The patient tolerated the procedure well.  She was instructed to keep the area clean and dry, remove pressure bandage in 24 hours, and keep insertion site covered with the steri-strip for 3-5 days.    Rx LoLoestrin 3pk w/ 3RF, condoms x 2wks  Follow-up 3mths for coc f/u  Marge DuncansBooker, Yan Okray Randall CNM, [facility]NP[facility]BC 04/23/2017 12:24 PM

## 2017-04-24 ENCOUNTER — Ambulatory Visit (HOSPITAL_COMMUNITY): Payer: Medicaid Other | Admitting: Anesthesiology

## 2017-04-24 ENCOUNTER — Ambulatory Visit (HOSPITAL_COMMUNITY)
Admission: RE | Admit: 2017-04-24 | Discharge: 2017-04-24 | Disposition: A | Discharge status: Home / Self Care | Payer: Medicaid Other | Source: Ambulatory Visit | Attending: General Surgery | Admitting: General Surgery

## 2017-04-24 ENCOUNTER — Encounter (HOSPITAL_COMMUNITY)
Admission: RE | Disposition: A | Discharge status: Home / Self Care | Payer: Self-pay | Source: Ambulatory Visit | Attending: General Surgery

## 2017-04-24 ENCOUNTER — Encounter (HOSPITAL_COMMUNITY): Payer: Self-pay | Admitting: Anesthesiology

## 2017-04-24 DIAGNOSIS — L0591 Pilonidal cyst without abscess: Secondary | ICD-10-CM | POA: Diagnosis not present

## 2017-04-24 HISTORY — PX: MASS EXCISION: SHX2000

## 2017-04-24 SURGERY — EXCISION MASS
Anesthesia: General

## 2017-04-24 MED ORDER — PROPOFOL 10 MG/ML IV BOLUS
INTRAVENOUS | Status: DC | PRN
  Administered 2017-04-24: 50 mg via INTRAVENOUS
  Administered 2017-04-24: 180 mg via INTRAVENOUS

## 2017-04-24 MED ORDER — FENTANYL CITRATE (PF) 100 MCG/2ML IJ SOLN
INTRAMUSCULAR | Status: DC | PRN
  Administered 2017-04-24: 25 ug via INTRAVENOUS
  Administered 2017-04-24 (×2): 50 ug via INTRAVENOUS

## 2017-04-24 MED ORDER — POVIDONE-IODINE 10 % OINT PACKET
TOPICAL_OINTMENT | CUTANEOUS | Status: DC | PRN
  Administered 2017-04-24: 1 via TOPICAL

## 2017-04-24 MED ORDER — POVIDONE-IODINE 10 % EX OINT
TOPICAL_OINTMENT | CUTANEOUS | Status: AC
  Filled 2017-04-24: qty 1

## 2017-04-24 MED ORDER — LIDOCAINE HCL (PF) 1 % IJ SOLN
INTRAMUSCULAR | Status: AC
  Filled 2017-04-24: qty 5

## 2017-04-24 MED ORDER — PROPOFOL 10 MG/ML IV BOLUS
INTRAVENOUS | Status: AC
  Filled 2017-04-24: qty 40

## 2017-04-24 MED ORDER — FENTANYL CITRATE (PF) 100 MCG/2ML IJ SOLN
INTRAMUSCULAR | Status: AC
  Filled 2017-04-24: qty 2

## 2017-04-24 MED ORDER — CEFAZOLIN SODIUM-DEXTROSE 2-4 GM/100ML-% IV SOLN
INTRAVENOUS | Status: AC
  Filled 2017-04-24: qty 100

## 2017-04-24 MED ORDER — HYDROCODONE-ACETAMINOPHEN 5-325 MG PO TABS: 1.0000 | ORAL_TABLET | ORAL | 0 refills | Status: DC | PRN

## 2017-04-24 MED ORDER — KETOROLAC TROMETHAMINE 30 MG/ML IJ SOLN
30.0000 mg | Freq: Once | INTRAMUSCULAR | Status: AC
  Administered 2017-04-24: 30 mg via INTRAVENOUS
  Filled 2017-04-24: qty 1

## 2017-04-24 MED ORDER — BACITRACIN-NEOMYCIN-POLYMYXIN 400-5-5000 EX OINT
TOPICAL_OINTMENT | CUTANEOUS | Status: AC
  Filled 2017-04-24: qty 1

## 2017-04-24 MED ORDER — HYDROMORPHONE HCL 1 MG/ML IJ SOLN
0.2500 mg | INTRAMUSCULAR | Status: DC | PRN
  Administered 2017-04-24: 0.5 mg via INTRAVENOUS
  Filled 2017-04-24: qty 1

## 2017-04-24 MED ORDER — ONDANSETRON HCL 4 MG/2ML IJ SOLN
INTRAMUSCULAR | Status: AC
  Filled 2017-04-24: qty 2

## 2017-04-24 MED ORDER — CEFAZOLIN SODIUM-DEXTROSE 2-4 GM/100ML-% IV SOLN
2.0000 g | INTRAVENOUS | Status: AC
  Administered 2017-04-24: 2 g via INTRAVENOUS

## 2017-04-24 MED ORDER — MIDAZOLAM HCL 2 MG/2ML IJ SOLN
1.0000 mg | INTRAMUSCULAR | Status: AC
  Administered 2017-04-24: 2 mg via INTRAVENOUS

## 2017-04-24 MED ORDER — CHLORHEXIDINE GLUCONATE CLOTH 2 % EX PADS: 6.0000 | MEDICATED_PAD | Freq: Once | CUTANEOUS | Status: DC

## 2017-04-24 MED ORDER — SODIUM CHLORIDE 0.9 % IJ SOLN
INTRAMUSCULAR | Status: AC
  Filled 2017-04-24: qty 20

## 2017-04-24 MED ORDER — ROCURONIUM BROMIDE 50 MG/5ML IV SOLN
INTRAVENOUS | Status: AC
  Filled 2017-04-24: qty 3

## 2017-04-24 MED ORDER — LIDOCAINE HCL (CARDIAC) 20 MG/ML IV SOLN
INTRAVENOUS | Status: DC | PRN
  Administered 2017-04-24: 4 mg via INTRAVENOUS

## 2017-04-24 MED ORDER — 0.9 % SODIUM CHLORIDE (POUR BTL) OPTIME
TOPICAL | Status: DC | PRN
  Administered 2017-04-24: 1000 mL

## 2017-04-24 MED ORDER — MIDAZOLAM HCL 2 MG/2ML IJ SOLN
INTRAMUSCULAR | Status: AC
  Filled 2017-04-24: qty 2

## 2017-04-24 MED ORDER — BUPIVACAINE HCL (PF) 0.5 % IJ SOLN
INTRAMUSCULAR | Status: DC | PRN
  Administered 2017-04-24: 10 mL

## 2017-04-24 MED ORDER — BUPIVACAINE HCL (PF) 0.5 % IJ SOLN
INTRAMUSCULAR | Status: AC
  Filled 2017-04-24: qty 30

## 2017-04-24 MED ORDER — LACTATED RINGERS IV SOLN
INTRAVENOUS | Status: DC
  Administered 2017-04-24: 1000 mL via INTRAVENOUS

## 2017-04-24 MED ORDER — EPHEDRINE SULFATE 50 MG/ML IJ SOLN
INTRAMUSCULAR | Status: AC
  Filled 2017-04-24: qty 2

## 2017-04-24 MED ORDER — ONDANSETRON HCL 4 MG/2ML IJ SOLN
4.0000 mg | Freq: Once | INTRAMUSCULAR | Status: AC
Start: 2017-04-24 — End: 2017-04-24
  Administered 2017-04-24: 4 mg via INTRAVENOUS

## 2017-04-24 SURGICAL SUPPLY — 30 items
BAG HAMPER (MISCELLANEOUS) ×3 IMPLANT
CLOTH BEACON ORANGE TIMEOUT ST (SAFETY) ×3 IMPLANT
COVER LIGHT HANDLE STERIS (MISCELLANEOUS) ×6 IMPLANT
DECANTER SPIKE VIAL GLASS SM (MISCELLANEOUS) ×3 IMPLANT
ELECT REM PT RETURN 9FT ADLT (ELECTROSURGICAL) ×3
ELECTRODE REM PT RTRN 9FT ADLT (ELECTROSURGICAL) ×1 IMPLANT
FORMALIN 10 PREFIL 120ML (MISCELLANEOUS) ×3 IMPLANT
GAUZE SPONGE 4X4 12PLY STRL (GAUZE/BANDAGES/DRESSINGS) ×2 IMPLANT
GLOVE BIOGEL PI IND STRL 6.5 (GLOVE) IMPLANT
GLOVE BIOGEL PI IND STRL 7.0 (GLOVE) ×1 IMPLANT
GLOVE BIOGEL PI INDICATOR 6.5 (GLOVE) ×2
GLOVE BIOGEL PI INDICATOR 7.0 (GLOVE) ×2
GLOVE SURG SS PI 6.5 STRL IVOR (GLOVE) ×2 IMPLANT
GLOVE SURG SS PI 7.5 STRL IVOR (GLOVE) ×3 IMPLANT
GOWN STRL REUS W/ TWL XL LVL3 (GOWN DISPOSABLE) ×1 IMPLANT
GOWN STRL REUS W/TWL LRG LVL3 (GOWN DISPOSABLE) ×3 IMPLANT
GOWN STRL REUS W/TWL XL LVL3 (GOWN DISPOSABLE) ×3
KIT ROOM TURNOVER APOR (KITS) ×3 IMPLANT
MANIFOLD NEPTUNE II (INSTRUMENTS) ×3 IMPLANT
NDL HYPO 25X1 1.5 SAFETY (NEEDLE) ×1 IMPLANT
NEEDLE HYPO 25X1 1.5 SAFETY (NEEDLE) ×3 IMPLANT
NS IRRIG 1000ML POUR BTL (IV SOLUTION) ×3 IMPLANT
PACK MINOR (CUSTOM PROCEDURE TRAY) ×2 IMPLANT
PAD ARMBOARD 7.5X6 YLW CONV (MISCELLANEOUS) ×3 IMPLANT
SET BASIN LINEN APH (SET/KITS/TRAYS/PACK) ×3 IMPLANT
SUT PROLENE 2 0 FS (SUTURE) ×4 IMPLANT
SUT VIC AB 2-0 CT1 27 (SUTURE) ×3
SUT VIC AB 2-0 CT1 TAPERPNT 27 (SUTURE) IMPLANT
SYR CONTROL 10ML LL (SYRINGE) ×3 IMPLANT
TAPE CLOTH SURG 4X10 WHT LF (GAUZE/BANDAGES/DRESSINGS) ×2 IMPLANT

## 2017-04-24 NOTE — Op Note (Signed)
Patient:  Bailey Palmer  DOB:  17-Sep-1996  MRN:  [id number]   Preop Diagnosis:  Pilonidal cyst  Postop Diagnosis:  Same  Procedure:  Excision of pilonidal cyst  Surgeon:  Franky MachoMark Nyles Mitton, M.D.  Anes:  Gen.  Indications:  Patient is a 21 year old female who presents with a pilonidal cyst. The risks and benefits of the procedure including bleeding, infection, and recurrence of the cyst as well as separation of the wound were fully explained to the patient, who gave informed consent.  Procedure note:  The patient was placed in the right lateral decubitus position after general anesthesia was administered. The pilonidal region was prepped and draped using the usual sterile technique with DuraPrep. Surgical site confirmation was performed.  An elliptical incision was made over the coccyx to include the pilonidal cyst. No abscess was present. The pilonidal cyst was excised without difficulty. The length of the incision was approximately 3 cm in length. It was sent to pathology for further examination. A bleeding was controlled using Bovie electrocautery. The wound was irrigated with normal saline. 0.5% Sensorcaine was instilled into the surrounding wound. The incision was closed using 2-0 Prolene vertical mattress sutures. Betadine ointment and a dry sterile dressing were applied.  All tape and needle counts were correct at the end the procedure. The patient was awakened and transferred to PACU in stable condition.  Complications:  None  EBL:  Minimal  Specimen:  Pilonidal cyst

## 2017-04-24 NOTE — Interval H&P Note (Signed)
History and Physical Interval Note:  04/24/2017 7:14 AM  Griselda Minerominique E Mangen  has presented today for surgery, with the diagnosis of pilondial cyst  The various methods of treatment have been discussed with the patient and family. After consideration of risks, benefits and other options for treatment, the patient has consented to  Procedure(s): EXCISION PILONDIAL CYST (N/A) as a surgical intervention .  The patient's history has been reviewed, patient examined, no change in status, stable for surgery.  I have reviewed the patient's chart and labs.  Questions were answered to the patient's satisfaction.     Franky MachoMark Onyx Edgley

## 2017-04-24 NOTE — Discharge Instructions (Signed)
Pilonidal Cyst A pilonidal cyst is a fluid-filled sac. It forms beneath the skin near your tailbone, at the top of the crease of your buttocks. A pilonidal cyst that is not large or infected may not cause symptoms or problems. If the cyst becomes irritated or infected, it may fill with pus. This causes pain and swelling (pilonidal abscess). An infected cyst may need to be treated with medicine, drained, or removed. What are the causes? The cause of a pilonidal cyst is not known. One cause may be a hair that grows into your skin (ingrown hair). What increases the risk? Pilonidal cysts are more common in boys and men. Risk factors include:  Having lots of hair near the crease of the buttocks.  Being overweight.  Having a pilonidal dimple.  Wearing tight clothing.  Not bathing or showering frequently.  Sitting for long periods of time. What are the signs or symptoms? Signs and symptoms of a pilonidal cyst may include:  Redness.  Pain and tenderness.  Warmth.  Swelling.  Pus.  Fever. How is this diagnosed? Your health care provider may diagnose a pilonidal cyst based on your symptoms and a physical exam. The health care provider may do a blood test to check for infection. If your cyst is draining pus, your health care provider may take a sample of the drainage to be tested at a laboratory. How is this treated? Surgery is the usual treatment for an infected pilonidal cyst. You may also have to take medicines before surgery. The type of surgery you have depends on the size and severity of the infected cyst. The different kinds of surgery include:  Incision and drainage. This is a procedure to open and drain the cyst.  Marsupialization. In this procedure, a large cyst or abscess may be opened and kept open by stitching the edges of the skin to the cyst walls.  Cyst removal. This procedure involves opening the skin and removing all or part of the cyst. Follow these instructions at  home:  Follow all of your surgeon's instructions carefully if you had surgery.  Take medicines only as directed by your health care provider.  If you were prescribed an antibiotic medicine, finish it all even if you start to feel better.  Keep the area around your pilonidal cyst clean and dry.  Clean the area as directed by your health care provider. Pat the area dry with a clean towel. Do not rub it as this may cause bleeding.  Remove hair from the area around the cyst as directed by your health care provider.  Do not wear tight clothing or sit in one place for long periods of time.  There are many different ways to close and cover an incision, including stitches, skin glue, and adhesive strips. Follow your health care provider's instructions on:  Incision care.  Bandage (dressing) changes and removal.  Incision closure removal. Contact a health care provider if:  You have drainage, redness, swelling, or pain at the site of the cyst.  You have a fever. This information is not intended to replace advice given to you by your health care provider. Make sure you discuss any questions you have with your health care provider. Document Released: 11/21/2000 Document Revised: 05/01/2016 Document Reviewed: 04/13/2014 Elsevier Interactive Patient Education  2017 Elsevier Inc.  

## 2017-04-24 NOTE — Anesthesia Preprocedure Evaluation (Signed)
Anesthesia Evaluation  Patient identified by MRN, date of birth, ID band Patient awake    Reviewed: Allergy & Precautions, NPO status , Patient's Chart, lab work & pertinent test results  Airway Mallampati: I  TM Distance: >3 FB     Dental  (+) Teeth Intact   Pulmonary neg pulmonary ROS,           Cardiovascular negative cardio ROS   Rhythm:Regular Rate:Normal     Neuro/Psych negative neurological ROS  negative psych ROS   GI/Hepatic negative GI ROS,   Endo/Other    Renal/GU      Musculoskeletal   Abdominal   Peds  Hematology  (+) Blood dyscrasia, anemia ,   Anesthesia Other Findings   Reproductive/Obstetrics                             Anesthesia Physical Anesthesia Plan  ASA: II  Anesthesia Plan: General   Post-op Pain Management:    Induction: Intravenous  Airway Management Planned: LMA  Additional Equipment:   Intra-op Plan:   Post-operative Plan: Extubation in OR  Informed Consent: I have reviewed the patients History and Physical, chart, labs and discussed the procedure including the risks, benefits and alternatives for the proposed anesthesia with the patient or authorized representative who has indicated his/her understanding and acceptance.     Plan Discussed with:   Anesthesia Plan Comments:         Anesthesia Quick Evaluation

## 2017-04-24 NOTE — Anesthesia Procedure Notes (Signed)
Procedure Name: LMA Insertion Date/Time: 04/24/2017 7:32 AM Performed by: Pernell DupreADAMS, AMY A Pre-anesthesia Checklist: Patient identified, Timeout performed, Emergency Drugs available, Suction available and Patient being monitored Patient Re-evaluated:Patient Re-evaluated prior to inductionOxygen Delivery Method: Circle system utilized Preoxygenation: Pre-oxygenation with 100% oxygen Intubation Type: IV induction Ventilation: Mask ventilation without difficulty LMA: LMA inserted LMA Size: 4.0 Number of attempts: 1 Placement Confirmation: positive ETCO2 and breath sounds checked- equal and bilateral Tube secured with: Tape Dental Injury: Teeth and Oropharynx as per pre-operative assessment

## 2017-04-24 NOTE — Transfer of Care (Signed)
Immediate Anesthesia Transfer of Care Note  Patient: Bailey Palmer  Procedure(s) Performed: Procedure(s): EXCISION PILONDIAL CYST (N/A)  Patient Location: PACU  Anesthesia Type:General  Level of Consciousness: awake, oriented and patient cooperative  Airway & Oxygen Therapy: Patient Spontanous Breathing and Patient connected to nasal cannula oxygen  Post-op Assessment: Report given to RN and Post -op Vital signs reviewed and stable  Post vital signs: Reviewed and stable  Last Vitals:  Vitals:   04/24/17 0715 04/24/17 0720  BP: (!) 103/58 (!) 102/56  Pulse:    Resp: (!) 31 (!) 32  Temp:      Last Pain: There were no vitals filed for this visit.       Complications: No apparent anesthesia complications

## 2017-04-24 NOTE — Anesthesia Postprocedure Evaluation (Signed)
Anesthesia Post Note Late Entry for 0840  Patient: Bailey MinerDominique E Verbrugge  Procedure(s) Performed: Procedure(s) (LRB): EXCISION PILONDIAL CYST (N/A)  Patient location during evaluation: PACU Anesthesia Type: General Level of consciousness: awake and alert and oriented Pain management: pain level controlled Vital Signs Assessment: post-procedure vital signs reviewed and stable Respiratory status: spontaneous breathing and respiratory function stable Cardiovascular status: stable Postop Assessment: no signs of nausea or vomiting Anesthetic complications: no     Last Vitals:  Vitals:   04/24/17 0915 04/24/17 0928  BP: (!) 106/53 (!) 105/55  Pulse: 68 73  Resp: (!) 21 12  Temp: 36.8 C 36.5 C    Last Pain:  Vitals:   04/24/17 0928  TempSrc: Oral  PainSc:                  [name] A

## 2017-04-24 NOTE — Progress Notes (Signed)
Please excuse Bailey SimonDominique Palmer from work on 04/24/2017.  She had a procedure at [facility] and will be able to return to work on Monday.

## 2017-04-27 ENCOUNTER — Encounter (HOSPITAL_COMMUNITY): Payer: Self-pay | Admitting: General Surgery

## 2017-05-07 ENCOUNTER — Encounter: Payer: Self-pay | Admitting: General Surgery

## 2017-05-07 ENCOUNTER — Ambulatory Visit (INDEPENDENT_AMBULATORY_CARE_PROVIDER_SITE_OTHER): Payer: Self-pay | Admitting: General Surgery

## 2017-05-07 ENCOUNTER — Ambulatory Visit: Payer: Medicaid Other | Admitting: General Surgery

## 2017-05-07 VITALS — BP 114/75 | HR 87 | Temp 98.2°F | Resp 18 | Ht 63.0 in | Wt 216.0 lb

## 2017-05-07 DIAGNOSIS — Z09 Encounter for follow-up examination after completed treatment for conditions other than malignant neoplasm: Secondary | ICD-10-CM

## 2017-05-07 NOTE — Progress Notes (Signed)
Subjective:     Bailey Palmer  Status post excision of pilonidal cyst. Has had some wound separation but no purulent drainage. Objective:    BP 114/75   Pulse 87   Temp 98.2 F (36.8 C)   Resp 18   Ht 5\' 3"  (1.6 m)   Wt 216 lb (98 kg)   LMP 04/15/2017 (Exact Date)   BMI 38.26 kg/m   General:  alert, cooperative and no distress  Incision with 2 out of 3 sutures loose. Some wound separation is noted. No purulent drainage noted. Sutures removed.     Assessment:    Doing well postoperatively.    Plan:  Keep wound clean with Q-tip and peroxide daily. Follow up here in 2 weeks for wound check.

## 2017-05-19 ENCOUNTER — Ambulatory Visit (INDEPENDENT_AMBULATORY_CARE_PROVIDER_SITE_OTHER): Payer: Self-pay | Admitting: General Surgery

## 2017-05-19 ENCOUNTER — Encounter: Payer: Self-pay | Admitting: General Surgery

## 2017-05-19 VITALS — BP 130/87 | HR 97 | Temp 97.3°F | Resp 18 | Ht 63.0 in | Wt 218.0 lb

## 2017-05-19 DIAGNOSIS — Z09 Encounter for follow-up examination after completed treatment for conditions other than malignant neoplasm: Secondary | ICD-10-CM

## 2017-05-19 NOTE — Progress Notes (Signed)
Subjective:     Bailey Palmer status post excision of pilonidal cyst. She does have a small opening along the medial aspect of the incision. No purulent drainage is noted. She denies any significant pain, it is more uncomfortable when sitting.  Objective:    BP 130/87   Pulse 97   Temp 97.3 F (36.3 C)   Resp 18   Ht 5\' 3"  (1.6 m)   Wt 218 lb (98.9 kg)   BMI 38.62 kg/m   General:  alert, cooperative and no distress  Small dehiscence of medial aspect of the incision. No purulent drainage noted. Silver nitrate applied to granulation tissue.     Assessment:    Small dehiscence of incision, not unexpected. No evidence of infection.    Plan:  Patient told to keep wound clean and dry. I will see her again in 2 weeks for follow-up wound check.

## 2017-06-02 ENCOUNTER — Ambulatory Visit: Payer: Medicaid Other | Admitting: General Surgery

## 2017-07-01 ENCOUNTER — Ambulatory Visit (INDEPENDENT_AMBULATORY_CARE_PROVIDER_SITE_OTHER): Payer: Medicaid Other | Admitting: Adult Health

## 2017-07-01 ENCOUNTER — Encounter: Payer: Self-pay | Admitting: Adult Health

## 2017-07-01 VITALS — BP 98/56 | HR 85 | Ht 63.0 in | Wt 220.0 lb

## 2017-07-01 DIAGNOSIS — L298 Other pruritus: Secondary | ICD-10-CM

## 2017-07-01 DIAGNOSIS — N760 Acute vaginitis: Secondary | ICD-10-CM | POA: Diagnosis not present

## 2017-07-01 DIAGNOSIS — B9689 Other specified bacterial agents as the cause of diseases classified elsewhere: Secondary | ICD-10-CM

## 2017-07-01 DIAGNOSIS — N898 Other specified noninflammatory disorders of vagina: Secondary | ICD-10-CM | POA: Diagnosis not present

## 2017-07-01 LAB — POCT WET PREP (WET MOUNT)
Clue Cells Wet Prep Whiff POC: POSITIVE
WBC WET PREP: POSITIVE

## 2017-07-01 MED ORDER — METRONIDAZOLE 500 MG PO TABS: 500.0000 mg | ORAL_TABLET | Freq: Two times a day (BID) | ORAL | 0 refills | Status: DC

## 2017-07-01 NOTE — Progress Notes (Signed)
Subjective:     Patient ID: Bailey Palmer, female   DOB: [birth year], 21 y.o.   MRN: [id number]  HPI Bailey Palmer is a 21 year old white female in complaining of vaginal discharge with odor and itching.The odor is worse after work.  Last sex was about a month ago.  Review of Systems +vaginal discharge with odor and itching Last sex about a month ago  Reviewed past medical,surgical, social and family history. Reviewed medications and allergies.     Objective:   Physical Exam BP (!) 98/56 (BP Location: Left Arm, Patient Position: Sitting, Cuff Size: Large)   Pulse 85   Ht 5\' 3"  (1.6 m)   Wt 220 lb (99.8 kg)   LMP 06/25/2017   Breastfeeding? No   BMI 38.97 kg/m   Skin warm and dry.Pelvic: external genitalia is normal in appearance no lesions, vagina: tan discharge with odor,urethra has no lesions or masses noted, cervix:smooth, uterus: normal size, shape and contour, non tender, no masses felt, adnexa: no masses or tenderness noted. Bladder is non tender and no masses felt. Wet prep: + for clue cells and +WBCs. GC/CHL obtained.     Assessment:     1. Vaginal discharge   2. Vaginal odor   3. Vaginal itching   4. BV (bacterial vaginosis)       Plan:    GC/CHL sent Rx flagyl 500 mg 1 bid x 7 days, no alcohol, review handout on BV    Follow up prn

## 2017-07-01 NOTE — Patient Instructions (Signed)
Bacterial Vaginosis Bacterial vaginosis is a vaginal infection that occurs when the normal balance of bacteria in the vagina is disrupted. It results from an overgrowth of certain bacteria. This is the most common vaginal infection among women ages 15-44. Because bacterial vaginosis increases your risk for STIs (sexually transmitted infections), getting treated can help reduce your risk for chlamydia, gonorrhea, herpes, and HIV (human immunodeficiency virus). Treatment is also important for preventing complications in pregnant women, because this condition can cause an early (premature) delivery. What are the causes? This condition is caused by an increase in harmful bacteria that are normally present in small amounts in the vagina. However, the reason that the condition develops is not fully understood. What increases the risk? The following factors may make you more likely to develop this condition:  Having a new sexual partner or multiple sexual partners.  Having unprotected sex.  Douching.  Having an intrauterine device (IUD).  Smoking.  Drug and alcohol abuse.  Taking certain antibiotic medicines.  Being pregnant.  You cannot get bacterial vaginosis from toilet seats, bedding, swimming pools, or contact with objects around you. What are the signs or symptoms? Symptoms of this condition include:  Grey or white vaginal discharge. The discharge can also be watery or foamy.  A fish-like odor with discharge, especially after sexual intercourse or during menstruation.  Itching in and around the vagina.  Burning or pain with urination.  Some women with bacterial vaginosis have no signs or symptoms. How is this diagnosed? This condition is diagnosed based on:  Your medical history.  A physical exam of the vagina.  Testing a sample of vaginal fluid under a microscope to look for a large amount of bad bacteria or abnormal cells. Your health care provider may use a cotton swab  or a small wooden spatula to collect the sample.  How is this treated? This condition is treated with antibiotics. These may be given as a pill, a vaginal cream, or a medicine that is put into the vagina (suppository). If the condition comes back after treatment, a second round of antibiotics may be needed. Follow these instructions at home: Medicines  Take over-the-counter and prescription medicines only as told by your health care provider.  Take or use your antibiotic as told by your health care provider. Do not stop taking or using the antibiotic even if you start to feel better. General instructions  If you have a female sexual partner, tell her that you have a vaginal infection. She should see her health care provider and be treated if she has symptoms. If you have a female sexual partner, he does not need treatment.  During treatment: ? Avoid sexual activity until you finish treatment. ? Do not douche. ? Avoid alcohol as directed by your health care provider. ? Avoid breastfeeding as directed by your health care provider.  Drink enough water and fluids to keep your urine clear or pale yellow.  Keep the area around your vagina and rectum clean. ? Wash the area daily with warm water. ? Wipe yourself from front to back after using the toilet.  Keep all follow-up visits as told by your health care provider. This is important. How is this prevented?  Do not douche.  Wash the outside of your vagina with warm water only.  Use protection when having sex. This includes latex condoms and dental dams.  Limit how many sexual partners you have. To help prevent bacterial vaginosis, it is best to have sex with just   one partner (monogamous).  Make sure you and your sexual partner are tested for STIs.  Wear cotton or cotton-lined underwear.  Avoid wearing tight pants and pantyhose, especially during summer.  Limit the amount of alcohol that you drink.  Do not use any products that  contain nicotine or tobacco, such as cigarettes and e-cigarettes. If you need help quitting, ask your health care provider.  Do not use illegal drugs. Where to find more information:  Centers for Disease Control and Prevention: SolutionApps.co.zawww.cdc.gov/std  American Sexual Health Association (ASHA): www.ashastd.org  U.S. Department of Health and Health and safety inspectorHuman Services, Office on Women's Health: ConventionalMedicines.siwww.womenshealth.gov/ or http://www.anderson-williamson.info/https://www.womenshealth.gov/a-z-topics/bacterial-vaginosis Contact a health care provider if:  Your symptoms do not improve, even after treatment.  You have more discharge or pain when urinating.  You have a fever.  You have pain in your abdomen.  You have pain during sex.  You have vaginal bleeding between periods. Summary  Bacterial vaginosis is a vaginal infection that occurs when the normal balance of bacteria in the vagina is disrupted.  Because bacterial vaginosis increases your risk for STIs (sexually transmitted infections), getting treated can help reduce your risk for chlamydia, gonorrhea, herpes, and HIV (human immunodeficiency virus). Treatment is also important for preventing complications in pregnant women, because the condition can cause an early (premature) delivery.  This condition is treated with antibiotic medicines. These may be given as a pill, a vaginal cream, or a medicine that is put into the vagina (suppository). This information is not intended to replace advice given to you by your health care provider. Make sure you discuss any questions you have with your health care provider. Document Released: 11/24/2005 Document Revised: 08/09/2016 Document Reviewed: 08/09/2016 Elsevier Interactive Patient Education  2017 [employer] No sex or alcohol

## 2017-07-03 LAB — GC/CHLAMYDIA PROBE AMP
CHLAMYDIA, DNA PROBE: NEGATIVE
Neisseria gonorrhoeae by PCR: NEGATIVE

## 2017-07-24 ENCOUNTER — Ambulatory Visit: Payer: Medicaid Other | Admitting: Women's Health

## 2017-10-15 IMAGING — DX DG LUMBAR SPINE 2-3V
3 series · 3 of 3 positions shown · non-contrast
Comparison: None.

CLINICAL DATA: Low lower midline back pain since [REDACTED]. No known
injury.

EXAM:
LUMBAR SPINE - 2-3 VIEW

[l-spine ap]
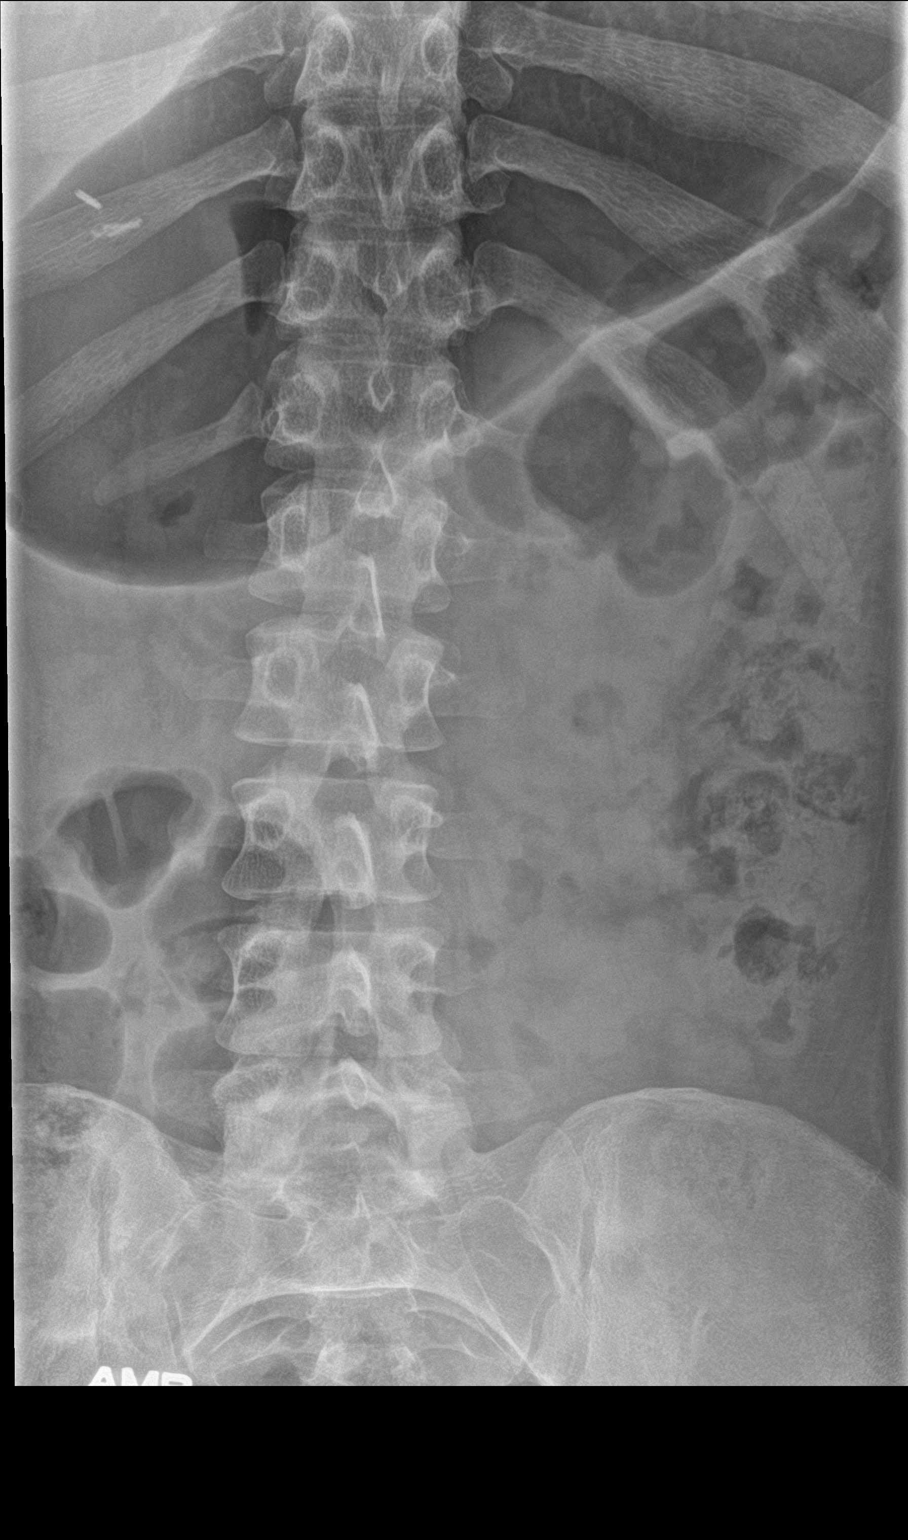

[l-spine lat]
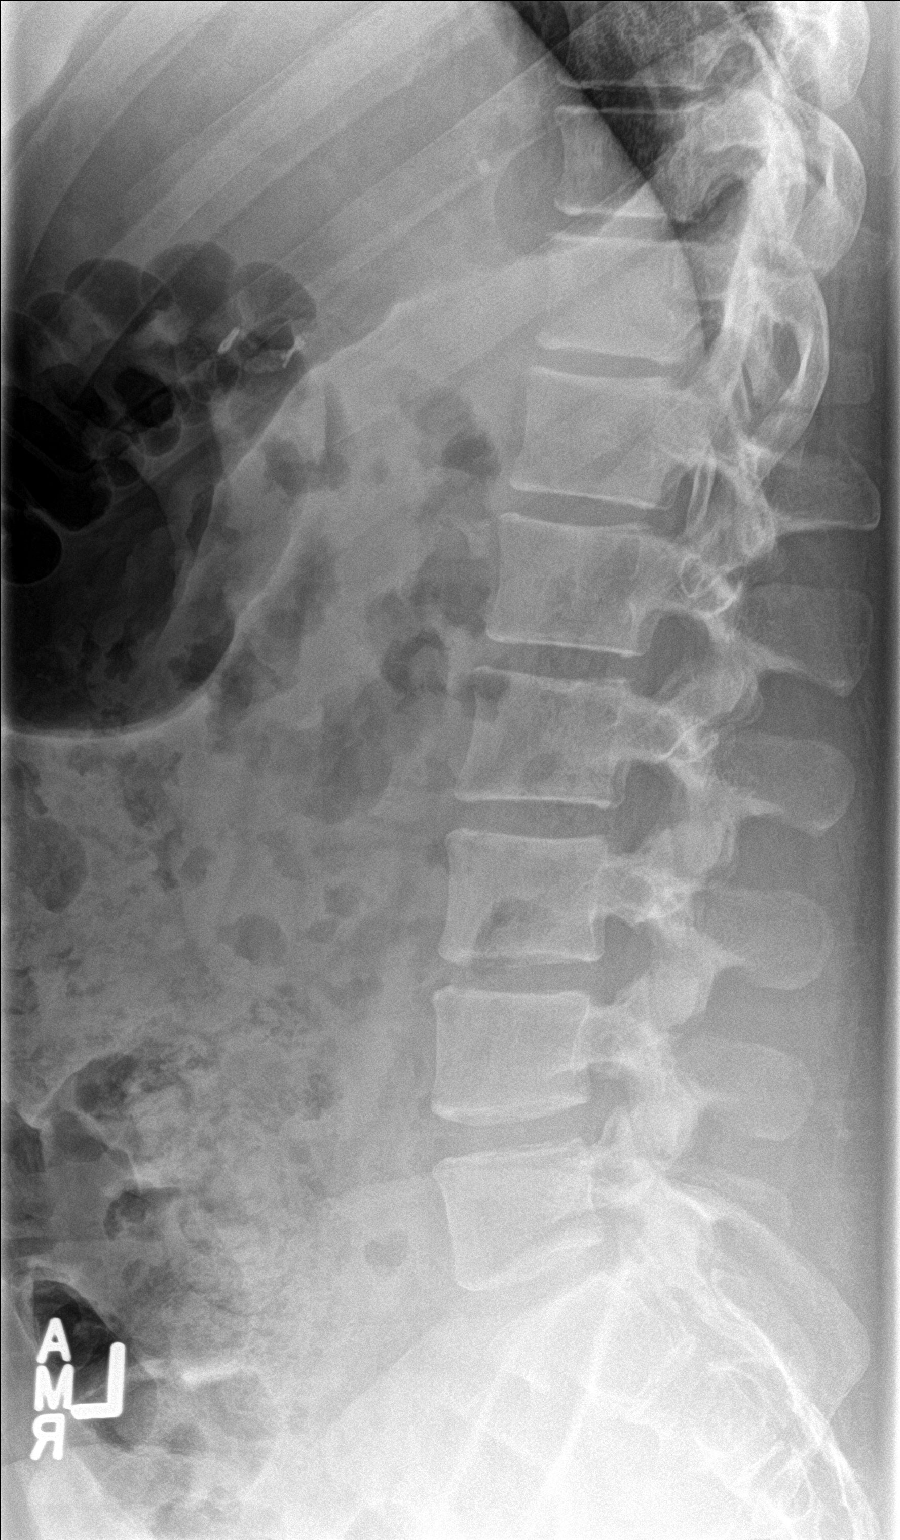

[l-spine spot]
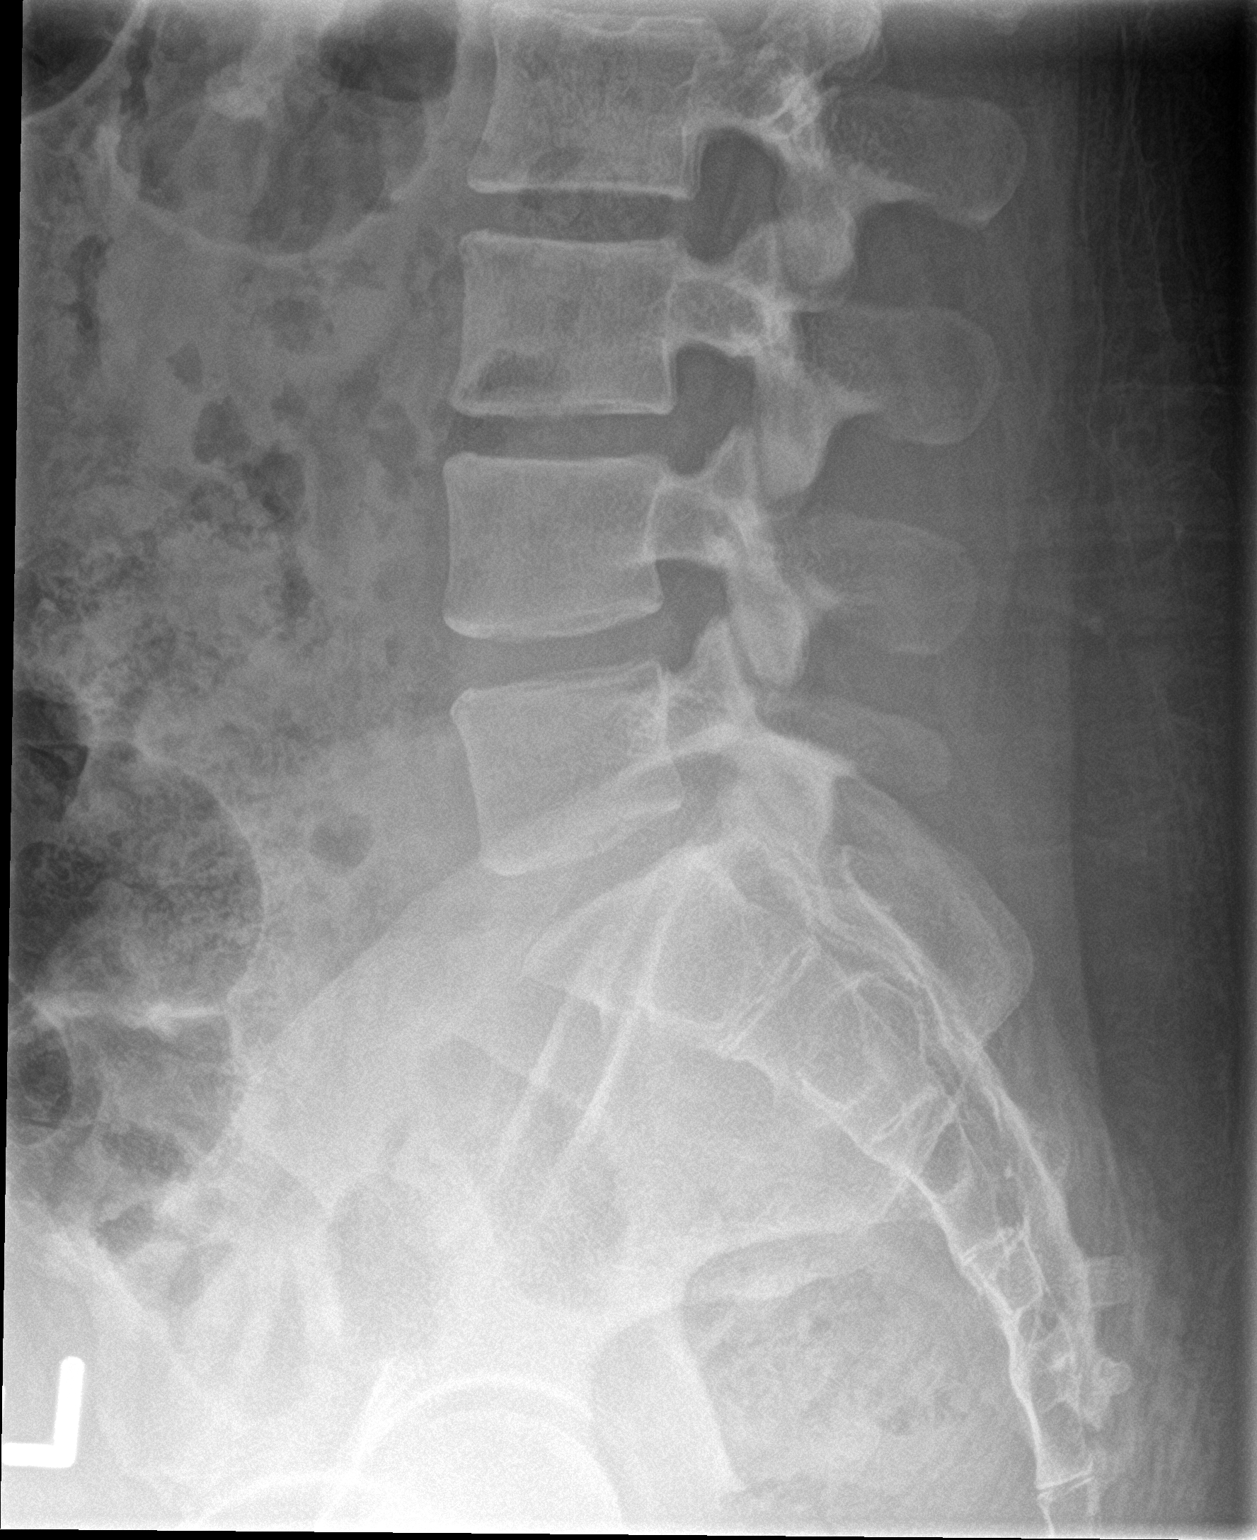

[3 of 3 positions shown; findings below may reference images not displayed]

FINDINGS: There is no evidence of lumbar spine fracture. Alignment is normal.
Intervertebral disc spaces are maintained.
IMPRESSION: Negative.

## 2017-11-16 ENCOUNTER — Ambulatory Visit: Payer: Self-pay | Admitting: Adult Health

## 2017-11-23 ENCOUNTER — Ambulatory Visit (INDEPENDENT_AMBULATORY_CARE_PROVIDER_SITE_OTHER): Payer: Medicaid Other | Admitting: Adult Health

## 2017-11-23 ENCOUNTER — Encounter: Payer: Self-pay | Admitting: Adult Health

## 2017-11-23 VITALS — BP 110/60 | HR 98 | Ht 63.0 in | Wt 234.5 lb

## 2017-11-23 DIAGNOSIS — N926 Irregular menstruation, unspecified: Secondary | ICD-10-CM

## 2017-11-23 DIAGNOSIS — Z3201 Encounter for pregnancy test, result positive: Secondary | ICD-10-CM

## 2017-11-23 DIAGNOSIS — Z331 Pregnant state, incidental: Secondary | ICD-10-CM | POA: Diagnosis not present

## 2017-11-23 DIAGNOSIS — Z3A08 8 weeks gestation of pregnancy: Secondary | ICD-10-CM

## 2017-11-23 DIAGNOSIS — O3680X0 Pregnancy with inconclusive fetal viability, not applicable or unspecified: Secondary | ICD-10-CM

## 2017-11-23 DIAGNOSIS — R110 Nausea: Secondary | ICD-10-CM | POA: Diagnosis not present

## 2017-11-23 LAB — POCT URINE PREGNANCY: Preg Test, Ur: POSITIVE — AB

## 2017-11-23 MED ORDER — PRENATAL GUMMIES/DHA & FA 0.4-32.5 MG PO CHEW: 2.0000 | CHEWABLE_TABLET | Freq: Every day | ORAL | Status: DC

## 2017-11-23 NOTE — Progress Notes (Signed)
Subjective:     Patient ID: Bailey Palmer, female   DOB: 07-20-1996, 21 y.o.   MRN: [id number]  HPI Bailey Palmer is a 21 year old white female in for UPT, has missed a period and had 2 +HPTs.   Review of Systems +missed period with 2+HPTs +nasuea Denies spotting,cramping or vomiting Reviewed past medical,surgical, social and family history. Reviewed medications and allergies.     Objective:   Physical Exam BP 110/60 (BP Location: Left Arm, Patient Position: Sitting, Cuff Size: Large)   Pulse 98   Ht 5\' 3"  (1.6 m)   Wt 234 lb 8 oz (106.4 kg)   LMP 09/27/2017   Breastfeeding? No   BMI 41.54 kg/m UPT +, about 8+1 week by LMP with EDD 07/04/18.Skin warm and dry. Neck: mid line trachea, normal thyroid, good ROM, no lymphadenopathy noted. Lungs: clear to ausculation bilaterally. Cardiovascular: regular rate and rhythm.Abdomen is soft and non tender.     Assessment:     1. Pregnancy examination or test, positive result   2. [redacted] weeks gestation of pregnancy   3. Encounter to determine fetal viability of pregnancy, single or unspecified fetus       Plan:    Take OTC gummies Return in 2 days for dating US Review handout on First trimester and by [employer] [facility] Eat often

## 2017-11-23 NOTE — Patient Instructions (Signed)
First Trimester of Pregnancy The first trimester of pregnancy is from week 1 until the end of week 13 (months 1 through 3). A week after a sperm fertilizes an egg, the egg will implant on the wall of the uterus. This embryo will begin to develop into a baby. Genes from you and your partner will form the baby. The female genes will determine whether the baby will be a boy or a girl. At 6-8 weeks, the eyes and face will be formed, and the heartbeat can be seen on ultrasound. At the end of 12 weeks, all the baby's organs will be formed. Now that you are pregnant, you will want to do everything you can to have a healthy baby. Two of the most important things are to get good prenatal care and to follow your health care provider's instructions. Prenatal care is all the medical care you receive before the baby's birth. This care will help prevent, find, and treat any problems during the pregnancy and childbirth. Body changes during your first trimester Your body goes through many changes during pregnancy. The changes vary from woman to woman.  You may gain or lose a couple of pounds at first.  You may feel sick to your stomach (nauseous) and you may throw up (vomit). If the vomiting is uncontrollable, call your health care provider.  You may tire easily.  You may develop headaches that can be relieved by medicines. All medicines should be approved by your health care provider.  You may urinate more often. Painful urination may mean you have a bladder infection.  You may develop heartburn as a result of your pregnancy.  You may develop constipation because certain hormones are causing the muscles that push stool through your intestines to slow down.  You may develop hemorrhoids or swollen veins (varicose veins).  Your breasts may begin to grow larger and become tender. Your nipples may stick out more, and the tissue that surrounds them (areola) may become darker.  Your gums may bleed and may be  sensitive to brushing and flossing.  Dark spots or blotches (chloasma, mask of pregnancy) may develop on your face. This will likely fade after the baby is born.  Your menstrual periods will stop.  You may have a loss of appetite.  You may develop cravings for certain kinds of food.  You may have changes in your emotions from day to day, such as being excited to be pregnant or being concerned that something may go wrong with the pregnancy and baby.  You may have more vivid and strange dreams.  You may have changes in your hair. These can include thickening of your hair, rapid growth, and changes in texture. Some women also have hair loss during or after pregnancy, or hair that feels dry or thin. Your hair will most likely return to normal after your baby is born.  What to expect at prenatal visits During a routine prenatal visit:  You will be weighed to make sure you and the baby are growing normally.  Your blood pressure will be taken.  Your abdomen will be measured to track your baby's growth.  The fetal heartbeat will be listened to between weeks 10 and 14 of your pregnancy.  Test results from any previous visits will be discussed.  Your health care provider may ask you:  How you are feeling.  If you are feeling the baby move.  If you have had any abnormal symptoms, such as leaking fluid, bleeding, severe headaches,   or abdominal cramping.  If you are using any tobacco products, including cigarettes, chewing tobacco, and electronic cigarettes.  If you have any questions.  Other tests that may be performed during your first trimester include:  Blood tests to find your blood type and to check for the presence of any previous infections. The tests will also be used to check for low iron levels (anemia) and protein on red blood cells (Rh antibodies). Depending on your risk factors, or if you previously had diabetes during pregnancy, you may have tests to check for high blood  sugar that affects pregnant women (gestational diabetes).  Urine tests to check for infections, diabetes, or protein in the urine.  An ultrasound to confirm the proper growth and development of the baby.  Fetal screens for spinal cord problems (spina bifida) and Down syndrome.  HIV (human immunodeficiency virus) testing. Routine prenatal testing includes screening for HIV, unless you choose not to have this test.  You may need other tests to make sure you and the baby are doing well.  Follow these instructions at home: Medicines  Follow your health care provider's instructions regarding medicine use. Specific medicines may be either safe or unsafe to take during pregnancy.  Take a prenatal vitamin that contains at least 600 micrograms (mcg) of folic acid.  If you develop constipation, try taking a stool softener if your health care provider approves. Eating and drinking  Eat a balanced diet that includes fresh fruits and vegetables, whole grains, good sources of protein such as meat, eggs, or tofu, and low-fat dairy. Your health care provider will help you determine the amount of weight gain that is right for you.  Avoid raw meat and uncooked cheese. These carry germs that can cause birth defects in the baby.  Eating four or five small meals rather than three large meals a day may help relieve nausea and vomiting. If you start to feel nauseous, eating a few soda crackers can be helpful. Drinking liquids between meals, instead of during meals, also seems to help ease nausea and vomiting.  Limit foods that are high in fat and processed sugars, such as fried and sweet foods.  To prevent constipation: ? Eat foods that are high in fiber, such as fresh fruits and vegetables, whole grains, and beans. ? Drink enough fluid to keep your urine clear or pale yellow. Activity  Exercise only as directed by your health care provider. Most women can continue their usual exercise routine during  pregnancy. Try to exercise for 30 minutes at least 5 days a week. Exercising will help you: ? Control your weight. ? Stay in shape. ? Be prepared for labor and delivery.  Experiencing pain or cramping in the lower abdomen or lower back is a good sign that you should stop exercising. Check with your health care provider before continuing with normal exercises.  Try to avoid standing for long periods of time. Move your legs often if you must stand in one place for a long time.  Avoid heavy lifting.  Wear low-heeled shoes and practice good posture.  You may continue to have sex unless your health care provider tells you not to. Relieving pain and discomfort  Wear a good support bra to relieve breast tenderness.  Take warm sitz baths to soothe any pain or discomfort caused by hemorrhoids. Use hemorrhoid cream if your health care provider approves.  Rest with your legs elevated if you have leg cramps or low back pain.  If you develop   varicose veins in your legs, wear support hose. Elevate your feet for 15 minutes, 3-4 times a day. Limit salt in your diet. Prenatal care  Schedule your prenatal visits by the twelfth week of pregnancy. They are usually scheduled monthly at first, then more often in the last 2 months before delivery.  Write down your questions. Take them to your prenatal visits.  Keep all your prenatal visits as told by your health care provider. This is important. Safety  Wear your seat belt at all times when driving.  Make a list of emergency phone numbers, including numbers for family, friends, the hospital, and police and fire departments. General instructions  Ask your health care provider for a referral to a local prenatal education class. Begin classes no later than the beginning of month 6 of your pregnancy.  Ask for help if you have counseling or nutritional needs during pregnancy. Your health care provider can offer advice or refer you to specialists for help  with various needs.  Do not use hot tubs, steam rooms, or saunas.  Do not douche or use tampons or scented sanitary pads.  Do not cross your legs for long periods of time.  Avoid cat litter boxes and soil used by cats. These carry germs that can cause birth defects in the baby and possibly loss of the fetus by miscarriage or stillbirth.  Avoid all smoking, herbs, alcohol, and medicines not prescribed by your health care provider. Chemicals in these products affect the formation and growth of the baby.  Do not use any products that contain nicotine or tobacco, such as cigarettes and e-cigarettes. If you need help quitting, ask your health care provider. You may receive counseling support and other resources to help you quit.  Schedule a dentist appointment. At home, brush your teeth with a soft toothbrush and be gentle when you floss. Contact a health care provider if:  You have dizziness.  You have mild pelvic cramps, pelvic pressure, or nagging pain in the abdominal area.  You have persistent nausea, vomiting, or diarrhea.  You have a bad smelling vaginal discharge.  You have pain when you urinate.  You notice increased swelling in your face, hands, legs, or ankles.  You are exposed to fifth disease or chickenpox.  You are exposed to [place] measles (rubella) and have never had it. Get help right away if:  You have a fever.  You are leaking fluid from your vagina.  You have spotting or bleeding from your vagina.  You have severe abdominal cramping or pain.  You have rapid weight gain or loss.  You vomit blood or material that looks like coffee grounds.  You develop a severe headache.  You have shortness of breath.  You have any kind of trauma, such as from a fall or a car accident. Summary  The first trimester of pregnancy is from week 1 until the end of week 13 (months 1 through 3).  Your body goes through many changes during pregnancy. The changes vary from  woman to woman.  You will have routine prenatal visits. During those visits, your health care provider will examine you, discuss any test results you may have, and talk with you about how you are feeling. This information is not intended to replace advice given to you by your health care provider. Make sure you discuss any questions you have with your health care provider. Document Released: 11/18/2001 Document Revised: 11/05/2016 Document Reviewed: 11/05/2016 [employer] Interactive Patient Education  2017 [employer]  

## 2017-11-25 ENCOUNTER — Ambulatory Visit (INDEPENDENT_AMBULATORY_CARE_PROVIDER_SITE_OTHER): Payer: Medicaid Other

## 2017-11-25 DIAGNOSIS — O3680X0 Pregnancy with inconclusive fetal viability, not applicable or unspecified: Secondary | ICD-10-CM | POA: Diagnosis not present

## 2017-11-25 DIAGNOSIS — Z3A08 8 weeks gestation of pregnancy: Secondary | ICD-10-CM | POA: Diagnosis not present

## 2017-11-25 NOTE — Progress Notes (Signed)
US 8+3 wks,single IUP w/ys,positive fht 164 bpm,normal ovaries bilat,crl 16.15 mm,EDD 07/04/2018 by LMP

## 2017-12-08 NOTE — L&D Delivery Note (Signed)
Delivery Note At  a viable female was delivered via  (Presentation:vertex ;LOA  ).  APGAR:9 , 9; weight  .   Placenta status:spont ,shultz  Cord:3vc  with the following complications:none .  Cord pH: n/a  Anesthesia:  epidural Episiotomy:  none Lacerations:  none Suture Repair: n/a Est. Blood Loss 100(mL):    Mom to postpartum.  Baby to Couplet care / Skin to Skin.  Bailey Palmer 07/04/2018, 1:07 PM

## 2017-12-16 ENCOUNTER — Other Ambulatory Visit: Payer: Self-pay | Admitting: Women's Health

## 2017-12-16 ENCOUNTER — Encounter: Payer: Self-pay | Admitting: Women's Health

## 2017-12-16 ENCOUNTER — Ambulatory Visit: Payer: Medicaid Other | Admitting: *Deleted

## 2017-12-16 ENCOUNTER — Ambulatory Visit (INDEPENDENT_AMBULATORY_CARE_PROVIDER_SITE_OTHER): Payer: Medicaid Other | Admitting: Women's Health

## 2017-12-16 VITALS — BP 118/60 | HR 98 | Wt 230.0 lb

## 2017-12-16 DIAGNOSIS — O99611 Diseases of the digestive system complicating pregnancy, first trimester: Secondary | ICD-10-CM

## 2017-12-16 DIAGNOSIS — Z331 Pregnant state, incidental: Secondary | ICD-10-CM

## 2017-12-16 DIAGNOSIS — Z3682 Encounter for antenatal screening for nuchal translucency: Secondary | ICD-10-CM

## 2017-12-16 DIAGNOSIS — Z23 Encounter for immunization: Secondary | ICD-10-CM | POA: Diagnosis not present

## 2017-12-16 DIAGNOSIS — Z3A11 11 weeks gestation of pregnancy: Secondary | ICD-10-CM

## 2017-12-16 DIAGNOSIS — Z3481 Encounter for supervision of other normal pregnancy, first trimester: Secondary | ICD-10-CM

## 2017-12-16 DIAGNOSIS — Z1389 Encounter for screening for other disorder: Secondary | ICD-10-CM

## 2017-12-16 DIAGNOSIS — K117 Disturbances of salivary secretion: Secondary | ICD-10-CM | POA: Diagnosis not present

## 2017-12-16 DIAGNOSIS — Z3490 Encounter for supervision of normal pregnancy, unspecified, unspecified trimester: Secondary | ICD-10-CM | POA: Insufficient documentation

## 2017-12-16 LAB — POCT URINALYSIS DIPSTICK
Glucose, UA: NEGATIVE
Ketones, UA: NEGATIVE
NITRITE UA: NEGATIVE
PROTEIN UA: NEGATIVE
RBC UA: NEGATIVE

## 2017-12-16 MED ORDER — GLYCOPYRROLATE 2 MG PO TABS
2.0000 mg | ORAL_TABLET | Freq: Two times a day (BID) | ORAL | 3 refills | Status: DC
Start: 2017-12-16 — End: 2017-12-31

## 2017-12-16 NOTE — Patient Instructions (Signed)
Griselda Minerominique E Kastelic, I greatly value your feedback.  If you receive a survey following your visit with us today, we appreciate you taking the time to fill it out.  Thanks, [name] Rubby Barbary, CNM, WHNP-BC   Nausea & Vomiting  Have saltine crackers or pretzels by your bed and eat a few bites before you raise your head out of bed in the morning  Eat small frequent meals throughout the day instead of large meals  Drink plenty of fluids throughout the day to stay hydrated, just don't drink a lot of fluids with your meals.  This can make your stomach fill up faster making you feel sick  Do not brush your teeth right after you eat  Products with real ginger are good for nausea, like ginger ale and ginger hard candy Make sure it says made with real ginger!  Sucking on sour candy like lemon heads is also good for nausea  If your prenatal vitamins make you nauseated, take them at night so you will sleep through the nausea  Sea Bands  If you feel like you need medicine for the nausea & vomiting please let us know  If you are unable to keep any fluids or food down please let us know   Constipation  Drink plenty of fluid, preferably water, throughout the day  Eat foods high in fiber such as fruits, vegetables, and grains  Exercise, such as walking, is a good way to keep your bowels regular  Drink warm fluids, especially warm prune juice, or decaf coffee  Eat a 1/2 cup of real oatmeal (not instant), 1/2 cup applesauce, and 1/2-1 cup warm prune juice every day  If needed, you may take Colace (docusate sodium) stool softener once or twice a day to help keep the stool soft. If you are pregnant, wait until you are out of your first trimester (12-14 weeks of pregnancy)  If you still are having problems with constipation, you may take Miralax once daily as needed to help keep your bowels regular.  If you are pregnant, wait until you are out of your first trimester (12-14 weeks of pregnancy)   First  Trimester of Pregnancy The first trimester of pregnancy is from week 1 until the end of week 12 (months 1 through 3). A week after a sperm fertilizes an egg, the egg will implant on the wall of the uterus. This embryo will begin to develop into a baby. Genes from you and your partner are forming the baby. The female genes determine whether the baby is a boy or a girl. At 6-8 weeks, the eyes and face are formed, and the heartbeat can be seen on ultrasound. At the end of 12 weeks, all the baby's organs are formed.  Now that you are pregnant, you will want to do everything you can to have a healthy baby. Two of the most important things are to get good prenatal care and to follow your health care provider's instructions. Prenatal care is all the medical care you receive before the baby's birth. This care will help prevent, find, and treat any problems during the pregnancy and childbirth. BODY CHANGES Your body goes through many changes during pregnancy. The changes vary from woman to woman.   You may gain or lose a couple of pounds at first.  You may feel sick to your stomach (nauseous) and throw up (vomit). If the vomiting is uncontrollable, call your health care provider.  You may tire easily.  You may develop headaches  that can be relieved by medicines approved by your health care provider.  You may urinate more often. Painful urination may mean you have a bladder infection.  You may develop heartburn as a result of your pregnancy.  You may develop constipation because certain hormones are causing the muscles that push waste through your intestines to slow down.  You may develop hemorrhoids or swollen, bulging veins (varicose veins).  Your breasts may begin to grow larger and become tender. Your nipples may stick out more, and the tissue that surrounds them (areola) may become darker.  Your gums may bleed and may be sensitive to brushing and flossing.  Dark spots or blotches (chloasma, mask  of pregnancy) may develop on your face. This will likely fade after the baby is born.  Your menstrual periods will stop.  You may have a loss of appetite.  You may develop cravings for certain kinds of food.  You may have changes in your emotions from day to day, such as being excited to be pregnant or being concerned that something may go wrong with the pregnancy and baby.  You may have more vivid and strange dreams.  You may have changes in your hair. These can include thickening of your hair, rapid growth, and changes in texture. Some women also have hair loss during or after pregnancy, or hair that feels dry or thin. Your hair will most likely return to normal after your baby is born. WHAT TO EXPECT AT YOUR PRENATAL VISITS During a routine prenatal visit:  You will be weighed to make sure you and the baby are growing normally.  Your blood pressure will be taken.  Your abdomen will be measured to track your baby's growth.  The fetal heartbeat will be listened to starting around week 10 or 12 of your pregnancy.  Test results from any previous visits will be discussed. Your health care provider may ask you:  How you are feeling.  If you are feeling the baby move.  If you have had any abnormal symptoms, such as leaking fluid, bleeding, severe headaches, or abdominal cramping.  If you have any questions. Other tests that may be performed during your first trimester include:  Blood tests to find your blood type and to check for the presence of any previous infections. They will also be used to check for low iron levels (anemia) and Rh antibodies. Later in the pregnancy, blood tests for diabetes will be done along with other tests if problems develop.  Urine tests to check for infections, diabetes, or protein in the urine.  An ultrasound to confirm the proper growth and development of the baby.  An amniocentesis to check for possible genetic problems.  Fetal screens for spina  bifida and Down syndrome.  You may need other tests to make sure you and the baby are doing well. HOME CARE INSTRUCTIONS  Medicines  Follow your health care provider's instructions regarding medicine use. Specific medicines may be either safe or unsafe to take during pregnancy.  Take your prenatal vitamins as directed.  If you develop constipation, try taking a stool softener if your health care provider approves. Diet  Eat regular, well-balanced meals. Choose a variety of foods, such as meat or vegetable-based protein, fish, milk and low-fat dairy products, vegetables, fruits, and whole grain breads and cereals. Your health care provider will help you determine the amount of weight gain that is right for you.  Avoid raw meat and uncooked cheese. These carry germs that can  cause birth defects in the baby.  Eating four or five small meals rather than three large meals a day may help relieve nausea and vomiting. If you start to feel nauseous, eating a few soda crackers can be helpful. Drinking liquids between meals instead of during meals also seems to help nausea and vomiting.  If you develop constipation, eat more high-fiber foods, such as fresh vegetables or fruit and whole grains. Drink enough fluids to keep your urine clear or pale yellow. Activity and Exercise  Exercise only as directed by your health care provider. Exercising will help you:  Control your weight.  Stay in shape.  Be prepared for labor and delivery.  Experiencing pain or cramping in the lower abdomen or low back is a good sign that you should stop exercising. Check with your health care provider before continuing normal exercises.  Try to avoid standing for long periods of time. Move your legs often if you must stand in one place for a long time.  Avoid heavy lifting.  Wear low-heeled shoes, and practice good posture.  You may continue to have sex unless your health care provider directs you  otherwise. Relief of Pain or Discomfort  Wear a good support bra for breast tenderness.   Take warm sitz baths to soothe any pain or discomfort caused by hemorrhoids. Use hemorrhoid cream if your health care provider approves.   Rest with your legs elevated if you have leg cramps or low back pain.  If you develop varicose veins in your legs, wear support hose. Elevate your feet for 15 minutes, 3-4 times a day. Limit salt in your diet. Prenatal Care  Schedule your prenatal visits by the twelfth week of pregnancy. They are usually scheduled monthly at first, then more often in the last 2 months before delivery.  Write down your questions. Take them to your prenatal visits.  Keep all your prenatal visits as directed by your health care provider. Safety  Wear your seat belt at all times when driving.  Make a list of emergency phone numbers, including numbers for family, friends, the hospital, and police and fire departments. General Tips  Ask your health care provider for a referral to a local prenatal education class. Begin classes no later than at the beginning of month 6 of your pregnancy.  Ask for help if you have counseling or nutritional needs during pregnancy. Your health care provider can offer advice or refer you to specialists for help with various needs.  Do not use hot tubs, steam rooms, or saunas.  Do not douche or use tampons or scented sanitary pads.  Do not cross your legs for long periods of time.  Avoid cat litter boxes and soil used by cats. These carry germs that can cause birth defects in the baby and possibly loss of the fetus by miscarriage or stillbirth.  Avoid all smoking, herbs, alcohol, and medicines not prescribed by your health care provider. Chemicals in these affect the formation and growth of the baby.  Schedule a dentist appointment. At home, brush your teeth with a soft toothbrush and be gentle when you floss. SEEK MEDICAL CARE IF:   You have  dizziness.  You have mild pelvic cramps, pelvic pressure, or nagging pain in the abdominal area.  You have persistent nausea, vomiting, or diarrhea.  You have a bad smelling vaginal discharge.  You have pain with urination.  You notice increased swelling in your face, hands, legs, or ankles. SEEK IMMEDIATE MEDICAL CARE IF:  You have a fever.  You are leaking fluid from your vagina.  You have spotting or bleeding from your vagina.  You have severe abdominal cramping or pain.  You have rapid weight gain or loss.  You vomit blood or material that looks like coffee grounds.  You are exposed to [place] measles and have never had them.  You are exposed to fifth disease or chickenpox.  You develop a severe headache.  You have shortness of breath.  You have any kind of trauma, such as from a fall or a car accident. Document Released: 11/18/2001 Document Revised: 04/10/2014 Document Reviewed: 10/04/2013 [facility] Patient Information 2015 [place], [place]. This information is not intended to replace advice given to you by your health care provider. Make sure you discuss any questions you have with your health care provider.

## 2017-12-16 NOTE — Progress Notes (Signed)
INITIAL OBSTETRICAL VISIT Patient name: Bailey Palmer MRN [id number]  Date of birth: 12-Jul-1996 Chief Complaint:   Initial Prenatal Visit  History of Present Illness:   Bailey Palmer is a 22 y.o. 512P1001 Caucasian female at 562w3d by LMP c/w 8wk u/s, with an Estimated Date of Delivery: 07/04/18 being seen today for her initial obstetrical visit.   Her obstetrical history is significant for term uncomplicated svb.   Today she reports excessive spitting.  Patient's last menstrual period was 09/27/2017 (approximate). Last pap <21yo. Results were: n/a Review of Systems:   Pertinent items are noted in HPI Denies cramping/contractions, leakage of fluid, vaginal bleeding, abnormal vaginal discharge w/ itching/odor/irritation, headaches, visual changes, shortness of breath, chest pain, abdominal pain, severe nausea/vomiting, or problems with urination or bowel movements unless otherwise stated above.  Pertinent History Reviewed:  Reviewed past medical,surgical, social, obstetrical and family history.  Reviewed problem list, medications and allergies. OB History  Gravida Para Term Preterm AB Living  2 1 1     1   SAB TAB Ectopic Multiple Live Births        0 1    # Outcome Date GA Lbr Len/2nd Weight Sex Delivery Anes PTL Lv  2 Current           1 Term 09/17/16 6032w4d 13:25 / 00:33 8 lb 2 oz (3.685 kg) F Vag-Spont EPI N LIV     Physical Assessment:   Vitals:   12/16/17 1515  BP: 118/60  Pulse: 98  Weight: 230 lb (104.3 kg)  Body mass index is 40.74 kg/m.       Physical Examination:  General appearance - well appearing, and in no distress  Mental status - alert, oriented to person, place, and time  Psych:  She has a normal mood and affect  Skin - warm and dry, normal color, no suspicious lesions noted  Chest - effort normal, all lung fields clear to auscultation bilaterally  Heart - normal rate and regular rhythm  Abdomen - soft, nontender  Extremities:  No swelling or  varicosities noted  Thin prep pap is not done  Fetal Heart Rate (bpm): +u/s via informal transabdominal u/s w/ +active fetus  Results for orders placed or performed in visit on 12/16/17 (from the past 24 hour(s))  POCT urinalysis dipstick   Collection Time: 12/16/17  3:26 PM  Result Value Ref Range   Color, UA     Clarity, UA     Glucose, UA neg    Bilirubin, UA     Ketones, UA neg    Spec Grav, UA  1.010 - 1.025   Blood, UA neg    pH, UA  5.0 - 8.0   Protein, UA neg    Urobilinogen, UA  0.2 or 1.0 E.U./dL   Nitrite, UA neg    Leukocytes, UA Small (1+) (A) Negative   Appearance     Odor      Assessment & Plan:  1) Low-Risk Pregnancy G2P1001 at 2162w3d with an Estimated Date of Delivery: 07/04/18   2) Initial OB visit  3) Ptyalism> rx robinul 2mg  BID   Initial labs obtained Continue prenatal vitamins Reviewed n/v relief measures and warning s/s to report Reviewed recommended weight gain based on pre-gravid BMI Encouraged well-balanced diet Genetic Screening discussed Integrated Screen: requested Cystic fibrosis screening discussed neg prev preg Ultrasound discussed; fetal survey: requested CCNC completed> spoke w/ Tobi Bastosnna Flu shot today  Follow-up: Return in about 2 weeks (around 12/30/2017) for LROB,  US:NT+1stIT.   Orders Placed This Encounter  Procedures  . GC/Chlamydia Probe Amp  . Urine Culture  . US Fetal Nuchal Translucency Measurement  . Obstetric Panel, Including HIV  . Urinalysis, Routine w reflex microscopic  . Pain Management Screening Profile (10S)  . POCT urinalysis dipstick    Marge Duncans CNM, [facility] 12/16/2017 4:07 PM

## 2017-12-17 LAB — PMP SCREEN PROFILE (10S), URINE
AMPHETAMINE SCREEN URINE: NEGATIVE ng/mL
BARBITURATE SCREEN URINE: NEGATIVE ng/mL
BENZODIAZEPINE SCREEN, URINE: NEGATIVE ng/mL
CANNABINOIDS UR QL SCN: NEGATIVE ng/mL
Cocaine (Metab) Scrn, Ur: NEGATIVE ng/mL
Creatinine(Crt), U: 66.8 mg/dL (ref 20.0–300.0)
Methadone Screen, Urine: NEGATIVE ng/mL
OXYCODONE+OXYMORPHONE UR QL SCN: NEGATIVE ng/mL
Opiate Scrn, Ur: NEGATIVE ng/mL
PHENCYCLIDINE QUANTITATIVE URINE: NEGATIVE ng/mL
Ph of Urine: 6.3 (ref 4.5–8.9)
Propoxyphene Scrn, Ur: NEGATIVE ng/mL

## 2017-12-17 LAB — MED LIST OPTION NOT SELECTED

## 2017-12-18 ENCOUNTER — Encounter: Payer: Self-pay | Admitting: Women's Health

## 2017-12-18 DIAGNOSIS — Z2839 Other underimmunization status: Secondary | ICD-10-CM | POA: Insufficient documentation

## 2017-12-18 DIAGNOSIS — Z283 Underimmunization status: Secondary | ICD-10-CM | POA: Insufficient documentation

## 2017-12-18 DIAGNOSIS — O09899 Supervision of other high risk pregnancies, unspecified trimester: Secondary | ICD-10-CM | POA: Insufficient documentation

## 2017-12-18 DIAGNOSIS — O9989 Other specified diseases and conditions complicating pregnancy, childbirth and the puerperium: Secondary | ICD-10-CM

## 2017-12-18 LAB — OBSTETRIC PANEL, INCLUDING HIV
Antibody Screen: NEGATIVE
BASOS ABS: 0 10*3/uL (ref 0.0–0.2)
Basos: 0 %
EOS (ABSOLUTE): 0.1 10*3/uL (ref 0.0–0.4)
Eos: 1 %
HEP B S AG: NEGATIVE
HIV Screen 4th Generation wRfx: NONREACTIVE
Hematocrit: 35.6 % (ref 34.0–46.6)
Hemoglobin: 11.9 g/dL (ref 11.1–15.9)
IMMATURE GRANULOCYTES: 0 %
Immature Grans (Abs): 0 10*3/uL (ref 0.0–0.1)
LYMPHS ABS: 1.9 10*3/uL (ref 0.7–3.1)
Lymphs: 24 %
MCH: 26.4 pg — AB (ref 26.6–33.0)
MCHC: 33.4 g/dL (ref 31.5–35.7)
MCV: 79 fL (ref 79–97)
Monocytes Absolute: 0.5 10*3/uL (ref 0.1–0.9)
Monocytes: 7 %
NEUTROS PCT: 68 %
Neutrophils Absolute: 5.4 10*3/uL (ref 1.4–7.0)
PLATELETS: 293 10*3/uL (ref 150–379)
RBC: 4.5 x10E6/uL (ref 3.77–5.28)
RDW: 15.2 % (ref 12.3–15.4)
RPR Ser Ql: NONREACTIVE
Rh Factor: POSITIVE
Rubella Antibodies, IGG: 0.93 index — ABNORMAL LOW (ref >0.99)
WBC: 7.9 10*3/uL (ref 3.4–10.8)

## 2017-12-18 LAB — URINALYSIS, ROUTINE W REFLEX MICROSCOPIC
Bilirubin, UA: NEGATIVE
GLUCOSE, UA: NEGATIVE
Ketones, UA: NEGATIVE
Nitrite, UA: NEGATIVE
PROTEIN UA: NEGATIVE
RBC UA: NEGATIVE
Specific Gravity, UA: 1.021 (ref 1.005–1.030)
UUROB: 1 mg/dL (ref 0.2–1.0)
pH, UA: 6.5 (ref 5.0–7.5)

## 2017-12-18 LAB — MICROSCOPIC EXAMINATION: Casts: NONE SEEN /lpf

## 2017-12-19 LAB — GC/CHLAMYDIA PROBE AMP
CHLAMYDIA, DNA PROBE: NEGATIVE
Neisseria gonorrhoeae by PCR: NEGATIVE

## 2017-12-21 LAB — URINE CULTURE

## 2017-12-31 ENCOUNTER — Other Ambulatory Visit (HOSPITAL_COMMUNITY)
Admission: RE | Admit: 2017-12-31 | Discharge: 2017-12-31 | Disposition: A | Discharge status: Home / Self Care | Payer: Medicaid Other | Source: Ambulatory Visit | Attending: Obstetrics & Gynecology | Admitting: Obstetrics & Gynecology

## 2017-12-31 ENCOUNTER — Ambulatory Visit (INDEPENDENT_AMBULATORY_CARE_PROVIDER_SITE_OTHER): Payer: Medicaid Other

## 2017-12-31 ENCOUNTER — Encounter: Payer: Self-pay | Admitting: Women's Health

## 2017-12-31 ENCOUNTER — Ambulatory Visit (INDEPENDENT_AMBULATORY_CARE_PROVIDER_SITE_OTHER): Payer: Medicaid Other | Admitting: Women's Health

## 2017-12-31 VITALS — BP 128/80 | HR 106 | Temp 97.9°F | Wt 230.0 lb

## 2017-12-31 DIAGNOSIS — Z3402 Encounter for supervision of normal first pregnancy, second trimester: Secondary | ICD-10-CM

## 2017-12-31 DIAGNOSIS — Z331 Pregnant state, incidental: Secondary | ICD-10-CM | POA: Diagnosis present

## 2017-12-31 DIAGNOSIS — Z3A13 13 weeks gestation of pregnancy: Secondary | ICD-10-CM

## 2017-12-31 DIAGNOSIS — Z3481 Encounter for supervision of other normal pregnancy, first trimester: Secondary | ICD-10-CM

## 2017-12-31 DIAGNOSIS — Z1389 Encounter for screening for other disorder: Secondary | ICD-10-CM

## 2017-12-31 DIAGNOSIS — Z3682 Encounter for antenatal screening for nuchal translucency: Secondary | ICD-10-CM

## 2017-12-31 LAB — POCT URINALYSIS DIPSTICK
Glucose, UA: NEGATIVE
Leukocytes, UA: NEGATIVE
Nitrite, UA: NEGATIVE
Protein, UA: NEGATIVE
RBC UA: NEGATIVE

## 2017-12-31 NOTE — Progress Notes (Signed)
US 13+4 wks,measurements c/w dates,NB present,crl 81.43 mm,fhr 167 bpm,NT 1.4 mm,posterior pl gr 0,normal ovaries bilat

## 2017-12-31 NOTE — Patient Instructions (Signed)
Bailey Palmer, I greatly value your feedback.  If you receive a survey following your visit with us today, we appreciate you taking the time to fill it out.  Thanks, Bailey Palmer , CNM, WHNP-BC   Second Trimester of Pregnancy The second trimester is from week 14 through week 27 (months 4 through 6). The second trimester is often a time when you feel your best. Your body has adjusted to being pregnant, and you begin to feel better physically. Usually, morning sickness has lessened or quit completely, you may have more energy, and you may have an increase in appetite. The second trimester is also a time when the fetus is growing rapidly. At the end of the sixth month, the fetus is about 9 inches long and weighs about 1 pounds. You will likely begin to feel the baby move (quickening) between 16 and 20 weeks of pregnancy. Body changes during your second trimester Your body continues to go through many changes during your second trimester. The changes vary from woman to woman.  Your weight will continue to increase. You will notice your lower abdomen bulging out.  You may begin to get stretch marks on your hips, abdomen, and breasts.  You may develop headaches that can be relieved by medicines. The medicines should be approved by your health care provider.  You may urinate more often because the fetus is pressing on your bladder.  You may develop or continue to have heartburn as a result of your pregnancy.  You may develop constipation because certain hormones are causing the muscles that push waste through your intestines to slow down.  You may develop hemorrhoids or swollen, bulging veins (varicose veins).  You may have back pain. This is caused by: ? Weight gain. ? Pregnancy hormones that are relaxing the joints in your pelvis. ? A shift in weight and the muscles that support your balance.  Your breasts will continue to grow and they will continue to become tender.  Your gums may bleed  and may be sensitive to brushing and flossing.  Dark spots or blotches (chloasma, mask of pregnancy) may develop on your face. This will likely fade after the baby is born.  A dark line from your belly button to the pubic area (linea nigra) may appear. This will likely fade after the baby is born.  You may have changes in your hair. These can include thickening of your hair, rapid growth, and changes in texture. Some women also have hair loss during or after pregnancy, or hair that feels dry or thin. Your hair will most likely return to normal after your baby is born.  What to expect at prenatal visits During a routine prenatal visit:  You will be weighed to make sure you and the fetus are growing normally.  Your blood pressure will be taken.  Your abdomen will be measured to track your baby's growth.  The fetal heartbeat will be listened to.  Any test results from the previous visit will be discussed.  Your health care provider may ask you:  How you are feeling.  If you are feeling the baby move.  If you have had any abnormal symptoms, such as leaking fluid, bleeding, severe headaches, or abdominal cramping.  If you are using any tobacco products, including cigarettes, chewing tobacco, and electronic cigarettes.  If you have any questions.  Other tests that may be performed during your second trimester include:  Blood tests that check for: ? Low iron levels (anemia). ? High  blood sugar that affects pregnant women (gestational diabetes) between 3 and 28 weeks. ? Rh antibodies. This is to check for a protein on red blood cells (Rh factor).  Urine tests to check for infections, diabetes, or protein in the urine.  An ultrasound to confirm the proper growth and development of the baby.  An amniocentesis to check for possible genetic problems.  Fetal screens for spina bifida and Down syndrome.  HIV (human immunodeficiency virus) testing. Routine prenatal testing includes  screening for HIV, unless you choose not to have this test.  Follow these instructions at home: Medicines  Follow your health care provider's instructions regarding medicine use. Specific medicines may be either safe or unsafe to take during pregnancy.  Take a prenatal vitamin that contains at least 600 micrograms (mcg) of folic acid.  If you develop constipation, try taking a stool softener if your health care provider approves. Eating and drinking  Eat a balanced diet that includes fresh fruits and vegetables, whole grains, good sources of protein such as meat, eggs, or tofu, and low-fat dairy. Your health care provider will help you determine the amount of weight gain that is right for you.  Avoid raw meat and uncooked cheese. These carry germs that can cause birth defects in the baby.  If you have low calcium intake from food, talk to your health care provider about whether you should take a daily calcium supplement.  Limit foods that are high in fat and processed sugars, such as fried and sweet foods.  To prevent constipation: ? Drink enough fluid to keep your urine clear or pale yellow. ? Eat foods that are high in fiber, such as fresh fruits and vegetables, whole grains, and beans. Activity  Exercise only as directed by your health care provider. Most women can continue their usual exercise routine during pregnancy. Try to exercise for 30 minutes at least 5 days a week. Stop exercising if you experience uterine contractions.  Avoid heavy lifting, wear low heel shoes, and practice good posture.  A sexual relationship may be continued unless your health care provider directs you otherwise. Relieving pain and discomfort  Wear a good support bra to prevent discomfort from breast tenderness.  Take warm sitz baths to soothe any pain or discomfort caused by hemorrhoids. Use hemorrhoid cream if your health care provider approves.  Rest with your legs elevated if you have leg cramps  or low back pain.  If you develop varicose veins, wear support hose. Elevate your feet for 15 minutes, 3-4 times a day. Limit salt in your diet. Prenatal Care  Write down your questions. Take them to your prenatal visits.  Keep all your prenatal visits as told by your health care provider. This is important. Safety  Wear your seat belt at all times when driving.  Make a list of emergency phone numbers, including numbers for family, friends, the hospital, and police and fire departments. General instructions  Ask your health care provider for a referral to a local prenatal education class. Begin classes no later than the beginning of month 6 of your pregnancy.  Ask for help if you have counseling or nutritional needs during pregnancy. Your health care provider can offer advice or refer you to specialists for help with various needs.  Do not use hot tubs, steam rooms, or saunas.  Do not douche or use tampons or scented sanitary pads.  Do not cross your legs for long periods of time.  Avoid cat litter boxes and soil  used by cats. These carry germs that can cause birth defects in the baby and possibly loss of the fetus by miscarriage or stillbirth.  Avoid all smoking, herbs, alcohol, and unprescribed drugs. Chemicals in these products can affect the formation and growth of the baby.  Do not use any products that contain nicotine or tobacco, such as cigarettes and e-cigarettes. If you need help quitting, ask your health care provider.  Visit your dentist if you have not gone yet during your pregnancy. Use a soft toothbrush to brush your teeth and be gentle when you floss. Contact a health care provider if:  You have dizziness.  You have mild pelvic cramps, pelvic pressure, or nagging pain in the abdominal area.  You have persistent nausea, vomiting, or diarrhea.  You have a bad smelling vaginal discharge.  You have pain when you urinate. Get help right away if:  You have a  fever.  You are leaking fluid from your vagina.  You have spotting or bleeding from your vagina.  You have severe abdominal cramping or pain.  You have rapid weight gain or weight loss.  You have shortness of breath with chest pain.  You notice sudden or extreme swelling of your face, hands, ankles, feet, or legs.  You have not felt your baby move in over an hour.  You have severe headaches that do not go away when you take medicine.  You have vision changes. Summary  The second trimester is from week 14 through week 27 (months 4 through 6). It is also a time when the fetus is growing rapidly.  Your body goes through many changes during pregnancy. The changes vary from woman to woman.  Avoid all smoking, herbs, alcohol, and unprescribed drugs. These chemicals affect the formation and growth your baby.  Do not use any tobacco products, such as cigarettes, chewing tobacco, and e-cigarettes. If you need help quitting, ask your health care provider.  Contact your health care provider if you have any questions. Keep all prenatal visits as told by your health care provider. This is important. This information is not intended to replace advice given to you by your health care provider. Make sure you discuss any questions you have with your health care provider. Document Released: 11/18/2001 Document Revised: 05/01/2016 Document Reviewed: 01/25/2013 Elsevier Interactive Patient Education  2017 [employer].

## 2017-12-31 NOTE — Progress Notes (Signed)
   LOW-RISK PREGNANCY VISIT Patient name: Bailey Palmer MRN [id number]  Date of birth: 03-Apr-1996 Chief Complaint:   Routine Prenatal Visit (1st IT; pap today)  History of Present Illness:   Bailey Palmer is a 22 y.o. 162P1001 female at 6977w4d with an Estimated Date of Delivery: 07/04/18 being seen today for ongoing management of a low-risk pregnancy.  Today she reports no complaints.  . Vag. Bleeding: None.  Movement: Absent. denies leaking of fluid. Review of Systems:   Pertinent items are noted in HPI Denies abnormal vaginal discharge w/ itching/odor/irritation, headaches, visual changes, shortness of breath, chest pain, abdominal pain, severe nausea/vomiting, or problems with urination or bowel movements unless otherwise stated above. Pertinent History Reviewed:  Reviewed past medical,surgical, social, obstetrical and family history.  Reviewed problem list, medications and allergies. Physical Assessment:   Vitals:   12/31/17 1624  BP: 128/80  Pulse: (!) 106  Temp: 97.9 F (36.6 C)  Weight: 230 lb (104.3 kg)  Body mass index is 40.74 kg/m.        Physical Examination:   General appearance: Well appearing, and in no distress  Mental status: Alert, oriented to person, place, and time  Skin: Warm & dry  Cardiovascular: Normal heart rate noted  Respiratory: Normal respiratory effort, no distress  Abdomen: Soft, gravid, nontender  Pelvic: thin prep pap obtained         Extremities: Edema: None  Fetal Status: Fetal Heart Rate (bpm): 167 u/s   Movement: Absent   US 13+4 wks,measurements c/w dates,NB present,crl 81.43 mm,fhr 167 bpm,NT 1.4 mm,posterior pl gr 0,normal ovaries bilat  Results for orders placed or performed in visit on 12/31/17 (from the past 24 hour(s))  POCT urinalysis dipstick   Collection Time: 12/31/17  4:25 PM  Result Value Ref Range   Color, UA     Clarity, UA     Glucose, UA neg    Bilirubin, UA     Ketones, UA 1+    Spec Grav, UA  1.010 - 1.025   Blood, UA neg    pH, UA  5.0 - 8.0   Protein, UA neg    Urobilinogen, UA  0.2 or 1.0 E.U./dL   Nitrite, UA neg    Leukocytes, UA Negative Negative   Appearance     Odor      Assessment & Plan:  1) Low-risk pregnancy G2P1001 at 8177w4d with an Estimated Date of Delivery: 07/04/18    Meds: No orders of the defined types were placed in this encounter.  Labs/procedures today: 1st IT, pap  Plan:  Continue routine obstetrical care   Reviewed: Preterm labor symptoms and general obstetric precautions including but not limited to vaginal bleeding, contractions, leaking of fluid and fetal movement were reviewed in detail with the patient.  All questions were answered  Follow-up: Return in about 3 weeks (around 01/21/2018) for LROB, 2nd IT.  Orders Placed This Encounter  Procedures  . Integrated 1  . POCT urinalysis dipstick   Marge DuncansBooker, Kimberly Randall CNM, [facility]NP-BC 12/31/2017 4:42 PM

## 2018-01-04 LAB — CYTOLOGY - PAP: DIAGNOSIS: NEGATIVE

## 2018-01-05 LAB — INTEGRATED 1
CROWN RUMP LENGTH MAT SCREEN: 81.4 mm
GEST. AGE ON COLLECTION DATE: 13.4 wk
Maternal Age at EDD: 21.5 yr
NUMBER OF FETUSES: 1
Nuchal Translucency (NT): 1.4 mm
PAPP-A Value: 1022.9 ng/mL
Weight: 230 [lb_av]

## 2018-01-21 ENCOUNTER — Ambulatory Visit (INDEPENDENT_AMBULATORY_CARE_PROVIDER_SITE_OTHER): Payer: Medicaid Other | Admitting: Women's Health

## 2018-01-21 ENCOUNTER — Encounter: Payer: Self-pay | Admitting: Women's Health

## 2018-01-21 VITALS — BP 120/60 | HR 108 | Wt 233.8 lb

## 2018-01-21 DIAGNOSIS — Z1389 Encounter for screening for other disorder: Secondary | ICD-10-CM

## 2018-01-21 DIAGNOSIS — Z363 Encounter for antenatal screening for malformations: Secondary | ICD-10-CM

## 2018-01-21 DIAGNOSIS — Z331 Pregnant state, incidental: Secondary | ICD-10-CM

## 2018-01-21 DIAGNOSIS — Z3482 Encounter for supervision of other normal pregnancy, second trimester: Secondary | ICD-10-CM

## 2018-01-21 DIAGNOSIS — Z3A16 16 weeks gestation of pregnancy: Secondary | ICD-10-CM

## 2018-01-21 DIAGNOSIS — Z1379 Encounter for other screening for genetic and chromosomal anomalies: Secondary | ICD-10-CM

## 2018-01-21 LAB — POCT URINALYSIS DIPSTICK
Glucose, UA: NEGATIVE
Ketones, UA: NEGATIVE
Nitrite, UA: NEGATIVE
Protein, UA: NEGATIVE
RBC UA: NEGATIVE

## 2018-01-21 NOTE — Patient Instructions (Signed)
Bailey Palmer, I greatly value your feedback.  If you receive a survey following your visit with us today, we appreciate you taking the time to fill it out.  Thanks, [name] Booker, CNM, WHNP-BC   Second Trimester of Pregnancy The second trimester is from week 14 through week 27 (months 4 through 6). The second trimester is often a time when you feel your best. Your body has adjusted to being pregnant, and you begin to feel better physically. Usually, morning sickness has lessened or quit completely, you may have more energy, and you may have an increase in appetite. The second trimester is also a time when the fetus is growing rapidly. At the end of the sixth month, the fetus is about 9 inches long and weighs about 1 pounds. You will likely begin to feel the baby move (quickening) between 16 and 20 weeks of pregnancy. Body changes during your second trimester Your body continues to go through many changes during your second trimester. The changes vary from woman to woman.  Your weight will continue to increase. You will notice your lower abdomen bulging out.  You may begin to get stretch marks on your hips, abdomen, and breasts.  You may develop headaches that can be relieved by medicines. The medicines should be approved by your health care provider.  You may urinate more often because the fetus is pressing on your bladder.  You may develop or continue to have heartburn as a result of your pregnancy.  You may develop constipation because certain hormones are causing the muscles that push waste through your intestines to slow down.  You may develop hemorrhoids or swollen, bulging veins (varicose veins).  You may have back pain. This is caused by: ? Weight gain. ? Pregnancy hormones that are relaxing the joints in your pelvis. ? A shift in weight and the muscles that support your balance.  Your breasts will continue to grow and they will continue to become tender.  Your gums may bleed  and may be sensitive to brushing and flossing.  Dark spots or blotches (chloasma, mask of pregnancy) may develop on your face. This will likely fade after the baby is born.  A dark line from your belly button to the pubic area (linea nigra) may appear. This will likely fade after the baby is born.  You may have changes in your hair. These can include thickening of your hair, rapid growth, and changes in texture. Some women also have hair loss during or after pregnancy, or hair that feels dry or thin. Your hair will most likely return to normal after your baby is born.  What to expect at prenatal visits During a routine prenatal visit:  You will be weighed to make sure you and the fetus are growing normally.  Your blood pressure will be taken.  Your abdomen will be measured to track your baby's growth.  The fetal heartbeat will be listened to.  Any test results from the previous visit will be discussed.  Your health care provider may ask you:  How you are feeling.  If you are feeling the baby move.  If you have had any abnormal symptoms, such as leaking fluid, bleeding, severe headaches, or abdominal cramping.  If you are using any tobacco products, including cigarettes, chewing tobacco, and electronic cigarettes.  If you have any questions.  Other tests that may be performed during your second trimester include:  Blood tests that check for: ? Low iron levels (anemia). ? High   blood sugar that affects pregnant women (gestational diabetes) between 3 and 28 weeks. ? Rh antibodies. This is to check for a protein on red blood cells (Rh factor).  Urine tests to check for infections, diabetes, or protein in the urine.  An ultrasound to confirm the proper growth and development of the baby.  An amniocentesis to check for possible genetic problems.  Fetal screens for spina bifida and Down syndrome.  HIV (human immunodeficiency virus) testing. Routine prenatal testing includes  screening for HIV, unless you choose not to have this test.  Follow these instructions at home: Medicines  Follow your health care provider's instructions regarding medicine use. Specific medicines may be either safe or unsafe to take during pregnancy.  Take a prenatal vitamin that contains at least 600 micrograms (mcg) of folic acid.  If you develop constipation, try taking a stool softener if your health care provider approves. Eating and drinking  Eat a balanced diet that includes fresh fruits and vegetables, whole grains, good sources of protein such as meat, eggs, or tofu, and low-fat dairy. Your health care provider will help you determine the amount of weight gain that is right for you.  Avoid raw meat and uncooked cheese. These carry germs that can cause birth defects in the baby.  If you have low calcium intake from food, talk to your health care provider about whether you should take a daily calcium supplement.  Limit foods that are high in fat and processed sugars, such as fried and sweet foods.  To prevent constipation: ? Drink enough fluid to keep your urine clear or pale yellow. ? Eat foods that are high in fiber, such as fresh fruits and vegetables, whole grains, and beans. Activity  Exercise only as directed by your health care provider. Most women can continue their usual exercise routine during pregnancy. Try to exercise for 30 minutes at least 5 days a week. Stop exercising if you experience uterine contractions.  Avoid heavy lifting, wear low heel shoes, and practice good posture.  A sexual relationship may be continued unless your health care provider directs you otherwise. Relieving pain and discomfort  Wear a good support bra to prevent discomfort from breast tenderness.  Take warm sitz baths to soothe any pain or discomfort caused by hemorrhoids. Use hemorrhoid cream if your health care provider approves.  Rest with your legs elevated if you have leg cramps  or low back pain.  If you develop varicose veins, wear support hose. Elevate your feet for 15 minutes, 3-4 times a day. Limit salt in your diet. Prenatal Care  Write down your questions. Take them to your prenatal visits.  Keep all your prenatal visits as told by your health care provider. This is important. Safety  Wear your seat belt at all times when driving.  Make a list of emergency phone numbers, including numbers for family, friends, the hospital, and police and fire departments. General instructions  Ask your health care provider for a referral to a local prenatal education class. Begin classes no later than the beginning of month 6 of your pregnancy.  Ask for help if you have counseling or nutritional needs during pregnancy. Your health care provider can offer advice or refer you to specialists for help with various needs.  Do not use hot tubs, steam rooms, or saunas.  Do not douche or use tampons or scented sanitary pads.  Do not cross your legs for long periods of time.  Avoid cat litter boxes and soil  used by cats. These carry germs that can cause birth defects in the baby and possibly loss of the fetus by miscarriage or stillbirth.  Avoid all smoking, herbs, alcohol, and unprescribed drugs. Chemicals in these products can affect the formation and growth of the baby.  Do not use any products that contain nicotine or tobacco, such as cigarettes and e-cigarettes. If you need help quitting, ask your health care provider.  Visit your dentist if you have not gone yet during your pregnancy. Use a soft toothbrush to brush your teeth and be gentle when you floss. Contact a health care provider if:  You have dizziness.  You have mild pelvic cramps, pelvic pressure, or nagging pain in the abdominal area.  You have persistent nausea, vomiting, or diarrhea.  You have a bad smelling vaginal discharge.  You have pain when you urinate. Get help right away if:  You have a  fever.  You are leaking fluid from your vagina.  You have spotting or bleeding from your vagina.  You have severe abdominal cramping or pain.  You have rapid weight gain or weight loss.  You have shortness of breath with chest pain.  You notice sudden or extreme swelling of your face, hands, ankles, feet, or legs.  You have not felt your baby move in over an hour.  You have severe headaches that do not go away when you take medicine.  You have vision changes. Summary  The second trimester is from week 14 through week 27 (months 4 through 6). It is also a time when the fetus is growing rapidly.  Your body goes through many changes during pregnancy. The changes vary from woman to woman.  Avoid all smoking, herbs, alcohol, and unprescribed drugs. These chemicals affect the formation and growth your baby.  Do not use any tobacco products, such as cigarettes, chewing tobacco, and e-cigarettes. If you need help quitting, ask your health care provider.  Contact your health care provider if you have any questions. Keep all prenatal visits as told by your health care provider. This is important. This information is not intended to replace advice given to you by your health care provider. Make sure you discuss any questions you have with your health care provider. Document Released: 11/18/2001 Document Revised: 05/01/2016 Document Reviewed: 01/25/2013 Elsevier Interactive Patient Education  2017 [employer].

## 2018-01-21 NOTE — Progress Notes (Addendum)
   LOW-RISK PREGNANCY VISIT Patient name: Bailey Palmer MRN [id number]  Date of birth: 13-May-1996 Chief Complaint:   Routine Prenatal Visit (2nd IT)  History of Present Illness:   Bailey Palmer is a 22 y.o. 662P1001 female at 2875w4d with an Estimated Date of Delivery: 07/04/18 being seen today for ongoing management of a low-risk pregnancy.  Today she reports no complaints. Goes to [facility] tonight for [facility] u/[facility].   . Vag. Bleeding: None.   . denies leaking of fluid. Review of Systems:   Pertinent items are noted in HPI Denies abnormal vaginal discharge w/ itching/odor/irritation, headaches, visual changes, shortness of breath, chest pain, abdominal pain, severe nausea/vomiting, or problems with urination or bowel movements unless otherwise stated above. Pertinent History Reviewed:  Reviewed past medical,surgical, social, obstetrical and family history.  Reviewed problem list, medications and allergies. Physical Assessment:   Vitals:   01/21/18 1524  BP: 120/60  Pulse: (!) 108  Weight: 233 lb 12.8 oz (106.1 kg)  Body mass index is 41.42 kg/m.        Physical Examination:   General appearance: Well appearing, and in no distress  Mental status: Alert, oriented to person, place, and time  Skin: Warm & dry  Cardiovascular: Normal heart rate noted  Respiratory: Normal respiratory effort, no distress  Abdomen: Soft, gravid, nontender  Pelvic: Cervical exam deferred         Extremities: Edema: None  Fetal Status:          Results for orders placed or performed in visit on 01/21/18 (from the past 24 hour(s))  POCT urinalysis dipstick   Collection Time: 01/21/18  3:25 PM  Result Value Ref Range   Color, UA     Clarity, UA     Glucose, UA neg    Bilirubin, UA     Ketones, UA neg    Spec Grav, UA  1.010 - 1.025   Blood, UA neg    pH, UA  5.0 - 8.0   Protein, UA neg    Urobilinogen, UA  0.2 or 1.0 E.U./dL   Nitrite, UA neg    Leukocytes, UA Moderate (2+) (A) Negative   Appearance     Odor      Assessment & Plan:  1) Low-risk pregnancy G2P1001 at 5575w4d with an Estimated Date of Delivery: 07/04/18    Meds: No orders of the defined types were placed in this encounter.  Labs/procedures today: 2nd IT  Plan:  Continue routine obstetrical care   Reviewed: Preterm labor symptoms and general obstetric precautions including but not limited to vaginal bleeding, contractions, leaking of fluid and fetal movement were reviewed in detail with the patient.  All questions were answered  Follow-up: Return in about 2 weeks (around 02/04/2018) for LROB, NW:GNFAOZHS:Anatomy.  Orders Placed This Encounter  Procedures  . US OB Comp + 14 Wk  . INTEGRATED 2  . POCT urinalysis dipstick   Cheral MarkerKimberly R Sami Roes CNM, [facility]NP-BC 01/21/2018 3:35 PM

## 2018-01-28 LAB — INTEGRATED 2
AFP MARKER: 45.8 ng/mL
AFP MOM: 1.98
CROWN RUMP LENGTH: 81.4 mm
DIA MOM: 0.82
DIA Value: 106.2 pg/mL
ESTRIOL UNCONJUGATED: 0.74 ng/mL
GESTATIONAL AGE: 16.7 wk
Gest. Age on Collection Date: 13.4 weeks
HCG MOM: 1.11
MATERNAL AGE AT EDD: 21.5 a
NUCHAL TRANSLUCENCY MOM: 0.71
Nuchal Translucency (NT): 1.4 mm
Number of Fetuses: 1
PAPP-A MoM: 1.27
PAPP-A Value: 1022.9 ng/mL
Test Results:: NEGATIVE
WEIGHT: 234 [lb_av]
Weight: 230 [lb_av]
hCG Value: 23.4 IU/mL
uE3 MoM: 0.92

## 2018-02-11 ENCOUNTER — Ambulatory Visit (INDEPENDENT_AMBULATORY_CARE_PROVIDER_SITE_OTHER): Payer: Medicaid Other | Admitting: Advanced Practice Midwife

## 2018-02-11 ENCOUNTER — Ambulatory Visit (INDEPENDENT_AMBULATORY_CARE_PROVIDER_SITE_OTHER): Payer: Medicaid Other

## 2018-02-11 VITALS — BP 112/66 | HR 100 | Wt 231.0 lb

## 2018-02-11 DIAGNOSIS — Z3482 Encounter for supervision of other normal pregnancy, second trimester: Secondary | ICD-10-CM

## 2018-02-11 DIAGNOSIS — Z1389 Encounter for screening for other disorder: Secondary | ICD-10-CM

## 2018-02-11 DIAGNOSIS — Z363 Encounter for antenatal screening for malformations: Secondary | ICD-10-CM | POA: Diagnosis not present

## 2018-02-11 DIAGNOSIS — Z331 Pregnant state, incidental: Secondary | ICD-10-CM

## 2018-02-11 DIAGNOSIS — Z3A19 19 weeks gestation of pregnancy: Secondary | ICD-10-CM

## 2018-02-11 LAB — POCT URINALYSIS DIPSTICK
Blood, UA: NEGATIVE
Glucose, UA: NEGATIVE
KETONES UA: NEGATIVE
Leukocytes, UA: NEGATIVE
NITRITE UA: NEGATIVE
PROTEIN UA: NEGATIVE

## 2018-02-11 NOTE — Progress Notes (Signed)
  G2P1001 8057w4d Estimated Date of Delivery: 07/04/18  Blood pressure 112/66, pulse 100, weight 231 lb (104.8 kg), last menstrual period 09/27/2017, not currently breastfeeding.   BP weight and urine results all reviewed and noted.  Please refer to the obstetrical flow sheet for the fundal height and fetal heart rate documentation:  Patient reports good fetal movement, denies any bleeding and no rupture of membranes symptoms or regular contractions. Patient is without complaints. All questions were answered.   Physical Assessment:   Vitals:   02/11/18 0937  BP: 112/66  Pulse: 100  Weight: 231 lb (104.8 kg)  Body mass index is 40.92 kg/m.        Physical Examination:   General appearance: Well appearing, and in no distress  Mental status: Alert, oriented to person, place, and time  Skin: Warm & dry  Cardiovascular: Normal heart rate noted  Respiratory: Normal respiratory effort, no distress  Abdomen: Soft, gravid, nontender  Pelvic: Cervical exam deferred         Extremities: Edema: None  Fetal Status:     Movement: Present   US 19+4 wks,cephalic,posterior pl gr 0,normal ovaries bilat,svp of fluid 3.6 cm,fhr 144 bpm,cx 5.8 cm,efw 312 g 55%,anatomy complete,no obvious abnormalities   Appetite is great.      Results for orders placed or performed in visit on 02/11/18 (from the past 24 hour(s))  POCT Urinalysis Dipstick   Collection Time: 02/11/18  9:40 AM  Result Value Ref Range   Color, UA     Clarity, UA     Glucose, UA neg    Bilirubin, UA     Ketones, UA neg    Spec Grav, UA  1.010 - 1.025   Blood, UA neg    pH, UA  5.0 - 8.0   Protein, UA neg    Urobilinogen, UA  0.2 or 1.0 E.U./dL   Nitrite, UA neg    Leukocytes, UA Negative Negative   Appearance     Odor       Orders Placed This Encounter  Procedures  . POCT Urinalysis Dipstick    Plan:  Continued routine obstetrical care,   Return in about 4 weeks (around 03/11/2018) for LROB.

## 2018-02-11 NOTE — Patient Instructions (Signed)
Bailey Palmer, I greatly value your feedback.  If you receive a survey following your visit with us today, we appreciate you taking the time to fill it out.  Thanks, Bailey Palmer, CNM     Second Trimester of Pregnancy The second trimester is from week 14 through week 27 (months 4 through 6). The second trimester is often a time when you feel your best. Your body has adjusted to being pregnant, and you begin to feel better physically. Usually, morning sickness has lessened or quit completely, you may have more energy, and you may have an increase in appetite. The second trimester is also a time when the fetus is growing rapidly. At the end of the sixth month, the fetus is about 9 inches long and weighs about 1 pounds. You will likely begin to feel the baby move (quickening) between 16 and 20 weeks of pregnancy. Body changes during your second trimester Your body continues to go through many changes during your second trimester. The changes vary from woman to woman.  Your weight will continue to increase. You will notice your lower abdomen bulging out.  You may begin to get stretch marks on your hips, abdomen, and breasts.  You may develop headaches that can be relieved by medicines. The medicines should be approved by your health care provider.  You may urinate more often because the fetus is pressing on your bladder.  You may develop or continue to have heartburn as a result of your pregnancy.  You may develop constipation because certain hormones are causing the muscles that push waste through your intestines to slow down.  You may develop hemorrhoids or swollen, bulging veins (varicose veins).  You may have back pain. This is caused by: ? Weight gain. ? Pregnancy hormones that are relaxing the joints in your pelvis. ? A shift in weight and the muscles that support your balance.  Your breasts will continue to grow and they will continue to become tender.  Your gums may  bleed and may be sensitive to brushing and flossing.  Dark spots or blotches (chloasma, mask of pregnancy) may develop on your face. This will likely fade after the baby is born.  A dark line from your belly button to the pubic area (linea nigra) may appear. This will likely fade after the baby is born.  You may have changes in your hair. These can include thickening of your hair, rapid growth, and changes in texture. Some women also have hair loss during or after pregnancy, or hair that feels dry or thin. Your hair will most likely return to normal after your baby is born.  What to expect at prenatal visits During a routine prenatal visit:  You will be weighed to make sure you and the fetus are growing normally.  Your blood pressure will be taken.  Your abdomen will be measured to track your baby's growth.  The fetal heartbeat will be listened to.  Any test results from the previous visit will be discussed.  Your health care provider may ask you:  How you are feeling.  If you are feeling the baby move.  If you have had any abnormal symptoms, such as leaking fluid, bleeding, severe headaches, or abdominal cramping.  If you are using any tobacco products, including cigarettes, chewing tobacco, and electronic cigarettes.  If you have any questions.  Other tests that may be performed during your second trimester include:  Blood tests that check for: ? Low iron levels (anemia). ?  High blood sugar that affects pregnant women (gestational diabetes) between 20 and 28 weeks. ? Rh antibodies. This is to check for a protein on red blood cells (Rh factor).  Urine tests to check for infections, diabetes, or protein in the urine.  An ultrasound to confirm the proper growth and development of the baby.  An amniocentesis to check for possible genetic problems.  Fetal screens for spina bifida and Down syndrome.  HIV (human immunodeficiency virus) testing. Routine prenatal testing  includes screening for HIV, unless you choose not to have this test.  Follow these instructions at home: Medicines  Follow your health care provider's instructions regarding medicine use. Specific medicines may be either safe or unsafe to take during pregnancy.  Take a prenatal vitamin that contains at least 600 micrograms (mcg) of folic acid.  If you develop constipation, try taking a stool softener if your health care provider approves. Eating and drinking  Eat a balanced diet that includes fresh fruits and vegetables, whole grains, good sources of protein such as meat, eggs, or tofu, and low-fat dairy. Your health care provider will help you determine the amount of weight gain that is right for you.  Avoid raw meat and uncooked cheese. These carry germs that can cause birth defects in the baby.  If you have low calcium intake from food, talk to your health care provider about whether you should take a daily calcium supplement.  Limit foods that are high in fat and processed sugars, such as fried and sweet foods.  To prevent constipation: ? Drink enough fluid to keep your urine clear or pale yellow. ? Eat foods that are high in fiber, such as fresh fruits and vegetables, whole grains, and beans. Activity  Exercise only as directed by your health care provider. Most women can continue their usual exercise routine during pregnancy. Try to exercise for 30 minutes at least 5 days a week. Stop exercising if you experience uterine contractions.  Avoid heavy lifting, wear low heel shoes, and practice good posture.  A sexual relationship may be continued unless your health care provider directs you otherwise. Relieving pain and discomfort  Wear a good support bra to prevent discomfort from breast tenderness.  Take warm sitz baths to soothe any pain or discomfort caused by hemorrhoids. Use hemorrhoid cream if your health care provider approves.  Rest with your legs elevated if you have  leg cramps or low back pain.  If you develop varicose veins, wear support hose. Elevate your feet for 15 minutes, 3-4 times a day. Limit salt in your diet. Prenatal Care  Write down your questions. Take them to your prenatal visits.  Keep all your prenatal visits as told by your health care provider. This is important. Safety  Wear your seat belt at all times when driving.  Make a list of emergency phone numbers, including numbers for family, friends, the hospital, and police and fire departments. General instructions  Ask your health care provider for a referral to a local prenatal education class. Begin classes no later than the beginning of month 6 of your pregnancy.  Ask for help if you have counseling or nutritional needs during pregnancy. Your health care provider can offer advice or refer you to specialists for help with various needs.  Do not use hot tubs, steam rooms, or saunas.  Do not douche or use tampons or scented sanitary pads.  Do not cross your legs for long periods of time.  Avoid cat litter boxes and  soil used by cats. These carry germs that can cause birth defects in the baby and possibly loss of the fetus by miscarriage or stillbirth.  Avoid all smoking, herbs, alcohol, and unprescribed drugs. Chemicals in these products can affect the formation and growth of the baby.  Do not use any products that contain nicotine or tobacco, such as cigarettes and e-cigarettes. If you need help quitting, ask your health care provider.  Visit your dentist if you have not gone yet during your pregnancy. Use a soft toothbrush to brush your teeth and be gentle when you floss. Contact a health care provider if:  You have dizziness.  You have mild pelvic cramps, pelvic pressure, or nagging pain in the abdominal area.  You have persistent nausea, vomiting, or diarrhea.  You have a bad smelling vaginal discharge.  You have pain when you urinate. Get help right away if:  You  have a fever.  You are leaking fluid from your vagina.  You have spotting or bleeding from your vagina.  You have severe abdominal cramping or pain.  You have rapid weight gain or weight loss.  You have shortness of breath with chest pain.  You notice sudden or extreme swelling of your face, hands, ankles, feet, or legs.  You have not felt your baby move in over an hour.  You have severe headaches that do not go away when you take medicine.  You have vision changes. Summary  The second trimester is from week 14 through week 27 (months 4 through 6). It is also a time when the fetus is growing rapidly.  Your body goes through many changes during pregnancy. The changes vary from woman to woman.  Avoid all smoking, herbs, alcohol, and unprescribed drugs. These chemicals affect the formation and growth your baby.  Do not use any tobacco products, such as cigarettes, chewing tobacco, and e-cigarettes. If you need help quitting, ask your health care provider.  Contact your health care provider if you have any questions. Keep all prenatal visits as told by your health care provider. This is important. This information is not intended to replace advice given to you by your health care provider. Make sure you discuss any questions you have with your health care provider.      CHILDBIRTH CLASSES [phone] is the phone number for Pregnancy Classes or hospital tours at [place] will be referred to  [email or web address] for more information on childbirth classes  At this site you may register for classes. You may sign up for a waiting list if classes are full. Please SIGN UP FOR THIS!.   When the waiting list becomes long, sometimes new classes can be added.

## 2018-02-11 NOTE — Progress Notes (Signed)
US 19+4 wks,cephalic,posterior pl gr 0,normal ovaries bilat,svp of fluid 3.6 cm,fhr 144 bpm,cx 5.8 cm,efw 312 g 55%,anatomy complete,no obvious abnormalities

## 2018-03-01 ENCOUNTER — Telehealth: Payer: Self-pay | Admitting: *Deleted

## 2018-03-01 NOTE — Telephone Encounter (Signed)
Left message letting pt know can take Claritin and/or Benadryl(alcohol free) at night. Advised to call with any questions. JSY

## 2018-03-11 ENCOUNTER — Ambulatory Visit (INDEPENDENT_AMBULATORY_CARE_PROVIDER_SITE_OTHER): Payer: Medicaid Other | Admitting: Women's Health

## 2018-03-11 ENCOUNTER — Encounter: Payer: Self-pay | Admitting: Women's Health

## 2018-03-11 VITALS — BP 120/70 | HR 98 | Wt 231.2 lb

## 2018-03-11 DIAGNOSIS — Z3A23 23 weeks gestation of pregnancy: Secondary | ICD-10-CM

## 2018-03-11 DIAGNOSIS — Z3482 Encounter for supervision of other normal pregnancy, second trimester: Secondary | ICD-10-CM

## 2018-03-11 DIAGNOSIS — Z1389 Encounter for screening for other disorder: Secondary | ICD-10-CM

## 2018-03-11 DIAGNOSIS — Z331 Pregnant state, incidental: Secondary | ICD-10-CM

## 2018-03-11 LAB — POCT URINALYSIS DIPSTICK
GLUCOSE UA: NEGATIVE
Nitrite, UA: NEGATIVE
Protein, UA: NEGATIVE
RBC UA: NEGATIVE

## 2018-03-11 NOTE — Patient Instructions (Signed)
Bailey Palmer, I greatly value your feedback.  If you receive a survey following your visit with us today, we appreciate you taking the time to fill it out.  Thanks, Bailey Palmer, CNM, WHNP-BC   You will have your sugar test next visit.  Please do not eat or drink anything after midnight the night before you come, not even water.  You will be here for at least two hours.     Call the office [phone]) or go to [facility] if:  You begin to have strong, frequent contractions  Your water breaks.  Sometimes it is a big gush of fluid, sometimes it is just a trickle that keeps getting your panties wet or running down your legs  You have vaginal bleeding.  It is normal to have a small amount of spotting if your cervix was checked.   You don't feel your baby moving like normal.  If you don't, get you something to eat and drink and lay down and focus on feeling your baby move.   If your baby is still not moving like normal, you should call the office or [facility] to [facility].  Second Trimester of Pregnancy The second trimester is from week 13 through week 28, months 4 through 6. The second trimester is often a time when you feel your best. Your body has also adjusted to being pregnant, and you begin to feel better physically. Usually, morning sickness has lessened or quit completely, you may have more energy, and you may have an increase in appetite. The second trimester is also a time when the fetus is growing rapidly. At the end of the sixth month, the fetus is about 9 inches long and weighs about 1 pounds. You will likely begin to feel the baby move (quickening) between 18 and 20 weeks of the pregnancy. BODY CHANGES Your body goes through many changes during pregnancy. The changes vary from woman to woman.   Your weight will continue to increase. You will notice your lower abdomen bulging out.  You may begin to get stretch marks on your hips, abdomen, and breasts.  You may develop  headaches that can be relieved by medicines approved by your health care provider.  You may urinate more often because the fetus is pressing on your bladder.  You may develop or continue to have heartburn as a result of your pregnancy.  You may develop constipation because certain hormones are causing the muscles that push waste through your intestines to slow down.  You may develop hemorrhoids or swollen, bulging veins (varicose veins).  You may have back pain because of the weight gain and pregnancy hormones relaxing your joints between the bones in your pelvis and as a result of a shift in weight and the muscles that support your balance.  Your breasts will continue to grow and be tender.  Your gums may bleed and may be sensitive to brushing and flossing.  Dark spots or blotches (chloasma, mask of pregnancy) may develop on your face. This will likely fade after the baby is born.  A dark line from your belly button to the pubic area (linea nigra) may appear. This will likely fade after the baby is born.  You may have changes in your hair. These can include thickening of your hair, rapid growth, and changes in texture. Some women also have hair loss during or after pregnancy, or hair that feels dry or thin. Your hair will most likely return to normal after your  baby is born. WHAT TO EXPECT AT YOUR PRENATAL VISITS During a routine prenatal visit:  You will be weighed to make sure you and the fetus are growing normally.  Your blood pressure will be taken.  Your abdomen will be measured to track your baby's growth.  The fetal heartbeat will be listened to.  Any test results from the previous visit will be discussed. Your health care provider may ask you:  How you are feeling.  If you are feeling the baby move.  If you have had any abnormal symptoms, such as leaking fluid, bleeding, severe headaches, or abdominal cramping.  If you have any questions. Other tests that may be  performed during your second trimester include:  Blood tests that check for:  Low iron levels (anemia).  Gestational diabetes (between 24 and 28 weeks).  Rh antibodies.  Urine tests to check for infections, diabetes, or protein in the urine.  An ultrasound to confirm the proper growth and development of the baby.  An amniocentesis to check for possible genetic problems.  Fetal screens for spina bifida and Down syndrome. HOME CARE INSTRUCTIONS   Avoid all smoking, herbs, alcohol, and unprescribed drugs. These chemicals affect the formation and growth of the baby.  Follow your health care provider's instructions regarding medicine use. There are medicines that are either safe or unsafe to take during pregnancy.  Exercise only as directed by your health care provider. Experiencing uterine cramps is a good sign to stop exercising.  Continue to eat regular, healthy meals.  Wear a good support bra for breast tenderness.  Do not use hot tubs, steam rooms, or saunas.  Wear your seat belt at all times when driving.  Avoid raw meat, uncooked cheese, cat litter boxes, and soil used by cats. These carry germs that can cause birth defects in the baby.  Take your prenatal vitamins.  Try taking a stool softener (if your health care provider approves) if you develop constipation. Eat more high-fiber foods, such as fresh vegetables or fruit and whole grains. Drink plenty of fluids to keep your urine clear or pale yellow.  Take warm sitz baths to soothe any pain or discomfort caused by hemorrhoids. Use hemorrhoid cream if your health care provider approves.  If you develop varicose veins, wear support hose. Elevate your feet for 15 minutes, 3-4 times a day. Limit salt in your diet.  Avoid heavy lifting, wear low heel shoes, and practice good posture.  Rest with your legs elevated if you have leg cramps or low back pain.  Visit your dentist if you have not gone yet during your pregnancy.  Use a soft toothbrush to brush your teeth and be gentle when you floss.  A sexual relationship may be continued unless your health care provider directs you otherwise.  Continue to go to all your prenatal visits as directed by your health care provider. SEEK MEDICAL CARE IF:   You have dizziness.  You have mild pelvic cramps, pelvic pressure, or nagging pain in the abdominal area.  You have persistent nausea, vomiting, or diarrhea.  You have a bad smelling vaginal discharge.  You have pain with urination. SEEK IMMEDIATE MEDICAL CARE IF:   You have a fever.  You are leaking fluid from your vagina.  You have spotting or bleeding from your vagina.  You have severe abdominal cramping or pain.  You have rapid weight gain or loss.  You have shortness of breath with chest pain.  You notice sudden or extreme   swelling of your face, hands, ankles, feet, or legs.  You have not felt your baby move in over an hour.  You have severe headaches that do not go away with medicine.  You have vision changes. Document Released: 11/18/2001 Document Revised: 11/29/2013 Document Reviewed: 01/25/2013 [facility] Patient Information 2015 [place], [place]. This information is not intended to replace advice given to you by your health care provider. Make sure you discuss any questions you have with your health care provider.

## 2018-03-11 NOTE — Progress Notes (Signed)
   LOW-RISK PREGNANCY VISIT Patient name: Bailey Palmer Weis MRN [id number]  Date of birth: Sep 17, 1996 Chief Complaint:   Routine Prenatal Visit  History of Present Illness:   Bailey Palmer Glab is a 22 y.o. 792P1001 female at 7088w4d with an Estimated Date of Delivery: 07/04/18 being seen today for ongoing management of a low-risk pregnancy. Wants tubes tied.  Today she reports no complaints. Contractions: Not present.  .  Movement: Present. denies leaking of fluid. Review of Systems:   Pertinent items are noted in HPI Denies abnormal vaginal discharge w/ itching/odor/irritation, headaches, visual changes, shortness of breath, chest pain, abdominal pain, severe nausea/vomiting, or problems with urination or bowel movements unless otherwise stated above. Pertinent History Reviewed:  Reviewed past medical,surgical, social, obstetrical and family history.  Reviewed problem list, medications and allergies. Physical Assessment:   Vitals:   03/11/18 1608  BP: 120/70  Pulse: 98  Weight: 231 lb 3.2 oz (104.9 kg)  Body mass index is 40.96 kg/m.        Physical Examination:   General appearance: Well appearing, and in no distress  Mental status: Alert, oriented to person, place, and time  Skin: Warm & dry  Cardiovascular: Normal heart rate noted  Respiratory: Normal respiratory effort, no distress  Abdomen: Soft, gravid, nontender  Pelvic: Cervical exam deferred         Extremities: Edema: Trace  Fetal Status: Fetal Heart Rate (bpm): 138 Fundal Height: 24 cm Movement: Present    Results for orders placed or performed in visit on 03/11/18 (from the past 24 hour(s))  POCT urinalysis dipstick   Collection Time: 03/11/18  4:14 PM  Result Value Ref Range   Color, UA     Clarity, UA     Glucose, UA neg    Bilirubin, UA     Ketones, UA large    Spec Grav, UA  1.010 - 1.025   Blood, UA neg    pH, UA  5.0 - 8.0   Protein, UA neg    Urobilinogen, UA  0.2 or 1.0 Palmer.U./dL   Nitrite, UA neg    Leukocytes, UA Small (1+) (A) Negative   Appearance     Odor      Assessment & Plan:  1) Low-risk pregnancy G2P1001 at 3788w4d with an Estimated Date of Delivery: 07/04/18   2) Contraception counseling, wants tubes tied, discussed permanency, high incidence of regret <30yo, LARCs just as effective. Interested in [place].    Meds: No orders of the defined types were placed in this encounter.  Labs/procedures today: none  Plan:  Continue routine obstetrical care   Reviewed: Preterm labor symptoms and general obstetric precautions including but not limited to vaginal bleeding, contractions, leaking of fluid and fetal movement were reviewed in detail with the patient.  All questions were answered  Follow-up: Return in about 1 month (around 04/08/2018) for LROB, PN2.  Orders Placed This Encounter  Procedures  . POCT urinalysis dipstick   Cheral MarkerKimberly R Booker CNM, [facility]-BC 03/11/2018 4:46 PM

## 2018-04-07 ENCOUNTER — Ambulatory Visit (INDEPENDENT_AMBULATORY_CARE_PROVIDER_SITE_OTHER): Payer: Medicaid Other | Admitting: Women's Health

## 2018-04-07 ENCOUNTER — Other Ambulatory Visit: Payer: Medicaid Other

## 2018-04-07 ENCOUNTER — Encounter: Payer: Self-pay | Admitting: Women's Health

## 2018-04-07 VITALS — BP 118/60 | HR 104 | Wt 233.0 lb

## 2018-04-07 DIAGNOSIS — Z3A27 27 weeks gestation of pregnancy: Secondary | ICD-10-CM

## 2018-04-07 DIAGNOSIS — M549 Dorsalgia, unspecified: Secondary | ICD-10-CM

## 2018-04-07 DIAGNOSIS — Z3482 Encounter for supervision of other normal pregnancy, second trimester: Secondary | ICD-10-CM

## 2018-04-07 DIAGNOSIS — Z331 Pregnant state, incidental: Secondary | ICD-10-CM

## 2018-04-07 DIAGNOSIS — Z1389 Encounter for screening for other disorder: Secondary | ICD-10-CM

## 2018-04-07 DIAGNOSIS — Z131 Encounter for screening for diabetes mellitus: Secondary | ICD-10-CM

## 2018-04-07 DIAGNOSIS — O9989 Other specified diseases and conditions complicating pregnancy, childbirth and the puerperium: Secondary | ICD-10-CM

## 2018-04-07 LAB — POCT URINALYSIS DIPSTICK
Blood, UA: NEGATIVE
GLUCOSE UA: NEGATIVE
KETONES UA: NEGATIVE
Leukocytes, UA: NEGATIVE
NITRITE UA: NEGATIVE

## 2018-04-07 NOTE — Progress Notes (Signed)
   LOW-RISK PREGNANCY VISIT Patient name: Bailey Palmer MRN [id number]  Date of birth: [birth year] Chief Complaint:   Routine Prenatal Visit (PN2 today)  History of Present Illness:   Bailey Palmer is a 22 y.o. G71P1001 female at [redacted]w[redacted]d with an Estimated Date of Delivery: 07/04/18 being seen today for ongoing management of a low-risk pregnancy.  Today she reports occ mid upper back pain while working, etc. Still undecided about contraception, contemplating BTL vs. IUD, discussed high incidence of regret <30yo, LARCs just as effective.  Contractions: Not present. Vag. Bleeding: None.  Movement: Present. denies leaking of fluid. Review of Systems:   Pertinent items are noted in HPI Denies abnormal vaginal discharge w/ itching/odor/irritation, headaches, visual changes, shortness of breath, chest pain, abdominal pain, severe nausea/vomiting, or problems with urination or bowel movements unless otherwise stated above. Pertinent History Reviewed:  Reviewed past medical,surgical, social, obstetrical and family history.  Reviewed problem list, medications and allergies. Physical Assessment:   Vitals:   04/07/18 0915  BP: 118/60  Pulse: (!) 104  Weight: 233 lb (105.7 kg)  Body mass index is 41.27 kg/m.        Physical Examination:   General appearance: Well appearing, and in no distress  Mental status: Alert, oriented to person, place, and time  Skin: Warm & dry  Cardiovascular: Normal heart rate noted  Respiratory: Normal respiratory effort, no distress  Abdomen: Soft, gravid, nontender  Pelvic: Cervical exam deferred         Extremities: Edema: None  Fetal Status: Fetal Heart Rate (bpm): 147 Fundal Height: 28 cm Movement: Present    Results for orders placed or performed in visit on 04/07/18 (from the past 24 hour(s))  POCT urinalysis dipstick   Collection Time: 04/07/18  9:16 AM  Result Value Ref Range   Color, UA     Clarity, UA     Glucose, UA neg    Bilirubin, UA     Ketones, UA neg    Spec Grav, UA  1.010 - 1.025   Blood, UA neg    pH, UA  5.0 - 8.0   Protein, UA trace    Urobilinogen, UA  0.2 or 1.0 E.U./dL   Nitrite, UA neg    Leukocytes, UA Negative Negative   Appearance     Odor      Assessment & Plan:  1) Low-risk pregnancy G2P1001 at [redacted]w[redacted]d with an Estimated Date of Delivery: 07/04/18   2) Occ mid upper back pain, use proper body mechanics, good posture   Meds: No orders of the defined types were placed in this encounter.  Labs/procedures today: PN2  Plan:  Continue routine obstetrical care   Reviewed: Preterm labor symptoms and general obstetric precautions including but not limited to vaginal bleeding, contractions, leaking of fluid and fetal movement were reviewed in detail with the patient.  Recommended Tdap at HD/PCP per CDC guidelines. All questions were answered  Follow-up: Return in about 1 month (around 05/05/2018) for LROB.  Orders Placed This Encounter  Procedures  . POCT urinalysis dipstick   Cheral Marker CNM, [facility] 04/07/2018 9:40 AM

## 2018-04-07 NOTE — Patient Instructions (Signed)
[name], I greatly value your feedback.  If you receive a survey following your visit with [place] today, we appreciate you taking the time to fill it out.  Thanks, Joellyn Haff, CNM, WHNP-BC   Call the office [phone]) or go to [facility] if:  You begin to have strong, frequent contractions  Your water breaks.  Sometimes it is a big gush of fluid, sometimes it is just a trickle that keeps getting your panties wet or running down your legs  You have vaginal bleeding.  It is normal to have a small amount of spotting if your cervix was checked.   You don't feel your baby moving like normal.  If you don't, get you something to eat and drink and lay down and focus on feeling your baby move.  You should feel at least 10 movements in 2 hours.  If you don't, you should call the office or go to [facility].    Tdap Vaccine  It is recommended that you get the Tdap vaccine during the third trimester of EACH pregnancy to help protect your baby from getting pertussis (whooping cough)  27-36 weeks is the BEST time to do this so that you can pass the protection on to your baby. During pregnancy is better than after pregnancy, but if you are unable to get it during pregnancy it will be offered at the hospital.   You can get this vaccine at the health department or your family doctor  Everyone who will be around your baby should also be up-to-date on their vaccines. Adults (who are not pregnant) only need 1 dose of Tdap during adulthood.   Third Trimester of Pregnancy The third trimester is from week 29 through week 42, months 7 through 9. The third trimester is a time when the fetus is growing rapidly. At the end of the ninth month, the fetus is about 20 inches in length and weighs 6-10 pounds.  BODY CHANGES Your body goes through many changes during pregnancy. The changes vary from woman to woman.   Your weight will continue to increase. You can expect to gain 25-35 pounds (11-16 kg) by  the end of the pregnancy.  You may begin to get stretch marks on your hips, abdomen, and breasts.  You may urinate more often because the fetus is moving lower into your pelvis and pressing on your bladder.  You may develop or continue to have heartburn as a result of your pregnancy.  You may develop constipation because certain hormones are causing the muscles that push waste through your intestines to slow down.  You may develop hemorrhoids or swollen, bulging veins (varicose veins).  You may have pelvic pain because of the weight gain and pregnancy hormones relaxing your joints between the bones in your pelvis. Backaches may result from overexertion of the muscles supporting your posture.  You may have changes in your hair. These can include thickening of your hair, rapid growth, and changes in texture. Some women also have hair loss during or after pregnancy, or hair that feels dry or thin. Your hair will most likely return to normal after your baby is born.  Your breasts will continue to grow and be tender. A yellow discharge may leak from your breasts called colostrum.  Your belly button may stick out.  You may feel short of breath because of your expanding uterus.  You may notice the fetus "dropping," or moving lower in your abdomen.  You may have a bloody  mucus discharge. This usually occurs a few days to a week before labor begins.  Your cervix becomes thin and soft (effaced) near your due date. WHAT TO EXPECT AT YOUR PRENATAL EXAMS  You will have prenatal exams every 2 weeks until week 36. Then, you will have weekly prenatal exams. During a routine prenatal visit:  You will be weighed to make sure you and the fetus are growing normally.  Your blood pressure is taken.  Your abdomen will be measured to track your baby's growth.  The fetal heartbeat will be listened to.  Any test results from the previous visit will be discussed.  You may have a cervical check near your  due date to see if you have effaced. At around 36 weeks, your caregiver will check your cervix. At the same time, your caregiver will also perform a test on the secretions of the vaginal tissue. This test is to determine if a type of bacteria, Group B streptococcus, is present. Your caregiver will explain this further. Your caregiver may ask you:  What your birth plan is.  How you are feeling.  If you are feeling the baby move.  If you have had any abnormal symptoms, such as leaking fluid, bleeding, severe headaches, or abdominal cramping.  If you have any questions. Other tests or screenings that may be performed during your third trimester include:  Blood tests that check for low iron levels (anemia).  Fetal testing to check the health, activity level, and growth of the fetus. Testing is done if you have certain medical conditions or if there are problems during the pregnancy. FALSE LABOR You may feel small, irregular contractions that eventually go away. These are called Braxton Hicks contractions, or false labor. Contractions may last for hours, days, or even weeks before true labor sets in. If contractions come at regular intervals, intensify, or become painful, it is best to be seen by your caregiver.  SIGNS OF LABOR   Menstrual-like cramps.  Contractions that are 5 minutes apart or less.  Contractions that start on the top of the uterus and spread down to the lower abdomen and back.  A sense of increased pelvic pressure or back pain.  A watery or bloody mucus discharge that comes from the vagina. If you have any of these signs before the 37th week of pregnancy, call your caregiver right away. You need to go to the hospital to get checked immediately. HOME CARE INSTRUCTIONS   Avoid all smoking, herbs, alcohol, and unprescribed drugs. These chemicals affect the formation and growth of the baby.  Follow your caregiver's instructions regarding medicine use. There are medicines  that are either safe or unsafe to take during pregnancy.  Exercise only as directed by your caregiver. Experiencing uterine cramps is a good sign to stop exercising.  Continue to eat regular, healthy meals.  Wear a good support bra for breast tenderness.  Do not use hot tubs, steam rooms, or saunas.  Wear your seat belt at all times when driving.  Avoid raw meat, uncooked cheese, cat litter boxes, and soil used by cats. These carry germs that can cause birth defects in the baby.  Take your prenatal vitamins.  Try taking a stool softener (if your caregiver approves) if you develop constipation. Eat more high-fiber foods, such as fresh vegetables or fruit and whole grains. Drink plenty of fluids to keep your urine clear or pale yellow.  Take warm sitz baths to soothe any pain or discomfort caused by hemorrhoids.  Use hemorrhoid cream if your caregiver approves.  If you develop varicose veins, wear support hose. Elevate your feet for 15 minutes, 3-4 times a day. Limit salt in your diet.  Avoid heavy lifting, wear low heal shoes, and practice good posture.  Rest a lot with your legs elevated if you have leg cramps or low back pain.  Visit your dentist if you have not gone during your pregnancy. Use a soft toothbrush to brush your teeth and be gentle when you floss.  A sexual relationship may be continued unless your caregiver directs you otherwise.  Do not travel far distances unless it is absolutely necessary and only with the approval of your caregiver.  Take prenatal classes to understand, practice, and ask questions about the labor and delivery.  Make a trial run to the hospital.  Pack your hospital bag.  Prepare the baby's nursery.  Continue to go to all your prenatal visits as directed by your caregiver. SEEK MEDICAL CARE IF:  You are unsure if you are in labor or if your water has broken.  You have dizziness.  You have mild pelvic cramps, pelvic pressure, or nagging  pain in your abdominal area.  You have persistent nausea, vomiting, or diarrhea.  You have a bad smelling vaginal discharge.  You have pain with urination. SEEK IMMEDIATE MEDICAL CARE IF:   You have a fever.  You are leaking fluid from your vagina.  You have spotting or bleeding from your vagina.  You have severe abdominal cramping or pain.  You have rapid weight loss or gain.  You have shortness of breath with chest pain.  You notice sudden or extreme swelling of your face, hands, ankles, feet, or legs.  You have not felt your baby move in over an hour.  You have severe headaches that do not go away with medicine.  You have vision changes. Document Released: 11/18/2001 Document Revised: 11/29/2013 Document Reviewed: 01/25/2013 [employer] Patient Information 2015 [employer]. This information is not intended to replace advice given to you by your health care provider. Make sure you discuss any questions you have with your health care provider.   

## 2018-04-08 LAB — CBC
HEMATOCRIT: 32.2 % — AB (ref 34.0–46.6)
Hemoglobin: 10.5 g/dL — ABNORMAL LOW (ref 11.1–15.9)
MCH: 25.9 pg — ABNORMAL LOW (ref 26.6–33.0)
MCHC: 32.6 g/dL (ref 31.5–35.7)
MCV: 80 fL (ref 79–97)
Platelets: 332 10*3/uL (ref 150–379)
RBC: 4.05 x10E6/uL (ref 3.77–5.28)
RDW: 13.8 % (ref 12.3–15.4)
WBC: 8.9 10*3/uL (ref 3.4–10.8)

## 2018-04-08 LAB — HIV ANTIBODY (ROUTINE TESTING W REFLEX): HIV SCREEN 4TH GENERATION: NONREACTIVE

## 2018-04-08 LAB — GLUCOSE TOLERANCE, 2 HOURS W/ 1HR
GLUCOSE, 2 HOUR: 122 mg/dL (ref 65–152)
Glucose, 1 hour: 149 mg/dL (ref 65–179)
Glucose, Fasting: 83 mg/dL (ref 65–91)

## 2018-04-08 LAB — RPR: RPR: NONREACTIVE

## 2018-04-08 LAB — ANTIBODY SCREEN: Antibody Screen: NEGATIVE

## 2018-04-09 ENCOUNTER — Other Ambulatory Visit: Payer: Medicaid Other

## 2018-04-09 ENCOUNTER — Encounter: Payer: Medicaid Other | Admitting: Women's Health

## 2018-04-20 ENCOUNTER — Telehealth: Payer: Self-pay | Admitting: *Deleted

## 2018-04-20 NOTE — Telephone Encounter (Signed)
Left message @ 4:49 pm. JSY 

## 2018-04-21 NOTE — Telephone Encounter (Signed)
Left message x 2. JSY 

## 2018-04-22 NOTE — Telephone Encounter (Signed)
Left message x 3. JSY 

## 2018-04-23 NOTE — Telephone Encounter (Signed)
Pt called and stated she is ok now. Encounter closed. JSY

## 2018-05-05 ENCOUNTER — Ambulatory Visit (INDEPENDENT_AMBULATORY_CARE_PROVIDER_SITE_OTHER): Payer: Medicaid Other | Admitting: Advanced Practice Midwife

## 2018-05-05 ENCOUNTER — Encounter: Payer: Self-pay | Admitting: Advanced Practice Midwife

## 2018-05-05 ENCOUNTER — Other Ambulatory Visit: Payer: Self-pay

## 2018-05-05 VITALS — BP 112/74 | HR 104 | Wt 236.0 lb

## 2018-05-05 DIAGNOSIS — Z23 Encounter for immunization: Secondary | ICD-10-CM | POA: Diagnosis not present

## 2018-05-05 DIAGNOSIS — Z1389 Encounter for screening for other disorder: Secondary | ICD-10-CM

## 2018-05-05 DIAGNOSIS — Z3483 Encounter for supervision of other normal pregnancy, third trimester: Secondary | ICD-10-CM

## 2018-05-05 DIAGNOSIS — Z331 Pregnant state, incidental: Secondary | ICD-10-CM

## 2018-05-05 DIAGNOSIS — Z3482 Encounter for supervision of other normal pregnancy, second trimester: Secondary | ICD-10-CM

## 2018-05-05 DIAGNOSIS — Z3A31 31 weeks gestation of pregnancy: Secondary | ICD-10-CM | POA: Diagnosis not present

## 2018-05-05 LAB — POCT URINALYSIS DIPSTICK
Blood, UA: NEGATIVE
Glucose, UA: NEGATIVE
Leukocytes, UA: NEGATIVE
Nitrite, UA: NEGATIVE
Protein, UA: POSITIVE — AB

## 2018-05-05 NOTE — Patient Instructions (Addendum)
Bailey Palmer, I greatly value your feedback.  If you receive a survey following your visit with [place] today, we appreciate you taking the time to fill it out.  Thanks, Cathie Beams, CNM   Call the office [phone]) or go to [facility] if:  You begin to have strong, frequent contractions  Your water breaks.  Sometimes it is a big gush of fluid, sometimes it is just a trickle that keeps getting your panties wet or running down your legs  You have vaginal bleeding.  It is normal to have a small amount of spotting if your cervix was checked.   You don't feel your baby moving like normal.  If you don't, get you something to eat and drink and lay down and focus on feeling your baby move.  You should feel at least 10 movements in 2 hours.  If you don't, you should call the office or go to [facility].     Third Trimester of Pregnancy The third trimester is from week 29 through week 42, months 7 through 9. The third trimester is a time when the fetus is growing rapidly. At the end of the ninth month, the fetus is about 20 inches in length and weighs 6-10 pounds.  BODY CHANGES Your body goes through many changes during pregnancy. The changes vary from woman to woman.   Your weight will continue to increase. You can expect to gain 25-35 pounds (11-16 kg) by the end of the pregnancy.  You may begin to get stretch marks on your hips, abdomen, and breasts.  You may urinate more often because the fetus is moving lower into your pelvis and pressing on your bladder.  You may develop or continue to have heartburn as a result of your pregnancy.  You may develop constipation because certain hormones are causing the muscles that push waste through your intestines to slow down.  You may develop hemorrhoids or swollen, bulging veins (varicose veins).  You may have pelvic pain because of the weight gain and pregnancy hormones relaxing your joints between the bones in your pelvis.  Backaches may result from overexertion of the muscles supporting your posture.  You may have changes in your hair. These can include thickening of your hair, rapid growth, and changes in texture. Some women also have hair loss during or after pregnancy, or hair that feels dry or thin. Your hair will most likely return to normal after your baby is born.  Your breasts will continue to grow and be tender. A yellow discharge may leak from your breasts called colostrum.  Your belly button may stick out.  You may feel short of breath because of your expanding uterus.  You may notice the fetus "dropping," or moving lower in your abdomen.  You may have a bloody mucus discharge. This usually occurs a few days to a week before labor begins.  Your cervix becomes thin and soft (effaced) near your due date. WHAT TO EXPECT AT YOUR PRENATAL EXAMS  You will have prenatal exams every 2 weeks until week 36. Then, you will have weekly prenatal exams. During a routine prenatal visit:  You will be weighed to make sure you and the fetus are growing normally.  Your blood pressure is taken.  Your abdomen will be measured to track your baby's growth.  The fetal heartbeat will be listened to.  Any test results from the previous visit will be discussed.  You may have a cervical check near your due date  to see if you have effaced. At around 36 weeks, your caregiver will check your cervix. At the same time, your caregiver will also perform a test on the secretions of the vaginal tissue. This test is to determine if a type of bacteria, Group B streptococcus, is present. Your caregiver will explain this further. Your caregiver may ask you:  What your birth plan is.  How you are feeling.  If you are feeling the baby move.  If you have had any abnormal symptoms, such as leaking fluid, bleeding, severe headaches, or abdominal cramping.  If you have any questions. Other tests or screenings that may be  performed during your third trimester include:  Blood tests that check for low iron levels (anemia).  Fetal testing to check the health, activity level, and growth of the fetus. Testing is done if you have certain medical conditions or if there are problems during the pregnancy. FALSE LABOR You may feel small, irregular contractions that eventually go away. These are called Braxton Hicks contractions, or false labor. Contractions may last for hours, days, or even weeks before true labor sets in. If contractions come at regular intervals, intensify, or become painful, it is best to be seen by your caregiver.  SIGNS OF LABOR   Menstrual-like cramps.  Contractions that are 5 minutes apart or less.  Contractions that start on the top of the uterus and spread down to the lower abdomen and back.  A sense of increased pelvic pressure or back pain.  A watery or bloody mucus discharge that comes from the vagina. If you have any of these signs before the 37th week of pregnancy, call your caregiver right away. You need to go to the hospital to get checked immediately. HOME CARE INSTRUCTIONS   Avoid all smoking, herbs, alcohol, and unprescribed drugs. These chemicals affect the formation and growth of the baby.  Follow your caregiver's instructions regarding medicine use. There are medicines that are either safe or unsafe to take during pregnancy.  Exercise only as directed by your caregiver. Experiencing uterine cramps is a good sign to stop exercising.  Continue to eat regular, healthy meals.  Wear a good support bra for breast tenderness.  Do not use hot tubs, steam rooms, or saunas.  Wear your seat belt at all times when driving.  Avoid raw meat, uncooked cheese, cat litter boxes, and soil used by cats. These carry germs that can cause birth defects in the baby.  Take your prenatal vitamins.  Try taking a stool softener (if your caregiver approves) if you develop constipation. Eat more  high-fiber foods, such as fresh vegetables or fruit and whole grains. Drink plenty of fluids to keep your urine clear or pale yellow.  Take warm sitz baths to soothe any pain or discomfort caused by hemorrhoids. Use hemorrhoid cream if your caregiver approves.  If you develop varicose veins, wear support hose. Elevate your feet for 15 minutes, 3-4 times a day. Limit salt in your diet.  Avoid heavy lifting, wear low heal shoes, and practice good posture.  Rest a lot with your legs elevated if you have leg cramps or low back pain.  Visit your dentist if you have not gone during your pregnancy. Use a soft toothbrush to brush your teeth and be gentle when you floss.  A sexual relationship may be continued unless your caregiver directs you otherwise.  Do not travel far distances unless it is absolutely necessary and only with the approval of your caregiver.  Take prenatal classes to understand, practice, and ask questions about the labor and delivery.  Make a trial run to the hospital.  Pack your hospital bag.  Prepare the baby's nursery.  Continue to go to all your prenatal visits as directed by your caregiver. SEEK MEDICAL CARE IF:  You are unsure if you are in labor or if your water has broken.  You have dizziness.  You have mild pelvic cramps, pelvic pressure, or nagging pain in your abdominal area.  You have persistent nausea, vomiting, or diarrhea.  You have a bad smelling vaginal discharge.  You have pain with urination. SEEK IMMEDIATE MEDICAL CARE IF:   You have a fever.  You are leaking fluid from your vagina.  You have spotting or bleeding from your vagina.  You have severe abdominal cramping or pain.  You have rapid weight loss or gain.  You have shortness of breath with chest pain.  You notice sudden or extreme swelling of your face, hands, ankles, feet, or legs.  You have not felt your baby move in over an hour.  You have severe headaches that do not go  away with medicine.  You have vision changes. Document Released: 11/18/2001 Document Revised: 11/29/2013 Document Reviewed: 01/25/2013 [facility] Patient Information 2015 [place], [place]. This information is not intended to replace advice given to you by your health care provider. Make sure you discuss any questions you have with your health care provider.

## 2018-05-05 NOTE — Progress Notes (Signed)
  G2P1001 [redacted]w[redacted]d Estimated Date of Delivery: 07/04/18  Blood pressure 112/74, pulse (!) 104, weight 236 lb (107 kg), last menstrual period 09/27/2017, not currently breastfeeding.   BP weight and urine results all reviewed and noted.  Please refer to the obstetrical flow sheet for the fundal height and fetal heart rate documentation:  Patient reports good fetal movement, denies any bleeding and no rupture of membranes symptoms or regular contractions. Patient is without complaints. All questions were answered.   Physical Assessment:   Vitals:   05/05/18 1600  BP: 112/74  Pulse: (!) 104  Weight: 236 lb (107 kg)  Body mass index is 41.81 kg/m.        Physical Examination:   General appearance: Well appearing, and in no distress  Mental status: Alert, oriented to person, place, and time  Skin: Warm & dry  Cardiovascular: Normal heart rate noted  Respiratory: Normal respiratory effort, no distress  Abdomen: Soft, gravid, nontender  Pelvic: Cervical exam deferred         Extremities: Edema: Trace  Fetal Status: Fetal Heart Rate (bpm): 145 Fundal Height: 32 cm Movement: Present    Results for orders placed or performed in visit on 05/05/18 (from the past 24 hour(s))  POCT urinalysis dipstick   Collection Time: 05/05/18  4:02 PM  Result Value Ref Range   Color, UA     Clarity, UA     Glucose, UA Negative Negative   Bilirubin, UA     Ketones, UA large    Spec Grav, UA  1.010 - 1.025   Blood, UA neg    pH, UA  5.0 - 8.0   Protein, UA Positive (A) Negative   Urobilinogen, UA  0.2 or 1.0 E.U./dL   Nitrite, UA neg    Leukocytes, UA Negative Negative   Appearance     Odor       Orders Placed This Encounter  Procedures  . Tdap vaccine greater than or equal to 7yo IM  . POCT urinalysis dipstick    Plan:  Continued routine obstetrical care,   Return in about 2 weeks (around 05/19/2018) for LROB.

## 2018-05-18 ENCOUNTER — Encounter: Payer: Self-pay | Admitting: Women's Health

## 2018-05-18 ENCOUNTER — Ambulatory Visit (INDEPENDENT_AMBULATORY_CARE_PROVIDER_SITE_OTHER): Payer: Medicaid Other | Admitting: Women's Health

## 2018-05-18 VITALS — BP 109/69 | HR 113 | Wt 237.0 lb

## 2018-05-18 DIAGNOSIS — Z331 Pregnant state, incidental: Secondary | ICD-10-CM

## 2018-05-18 DIAGNOSIS — O9989 Other specified diseases and conditions complicating pregnancy, childbirth and the puerperium: Secondary | ICD-10-CM

## 2018-05-18 DIAGNOSIS — Z029 Encounter for administrative examinations, unspecified: Secondary | ICD-10-CM

## 2018-05-18 DIAGNOSIS — M5432 Sciatica, left side: Secondary | ICD-10-CM

## 2018-05-18 DIAGNOSIS — Z3483 Encounter for supervision of other normal pregnancy, third trimester: Secondary | ICD-10-CM

## 2018-05-18 DIAGNOSIS — Z3A33 33 weeks gestation of pregnancy: Secondary | ICD-10-CM

## 2018-05-18 DIAGNOSIS — Z1389 Encounter for screening for other disorder: Secondary | ICD-10-CM

## 2018-05-18 LAB — POCT URINALYSIS DIPSTICK
Blood, UA: NEGATIVE
GLUCOSE UA: NEGATIVE
Leukocytes, UA: NEGATIVE
Nitrite, UA: NEGATIVE
Protein, UA: POSITIVE — AB

## 2018-05-18 NOTE — Progress Notes (Signed)
   LOW-RISK PREGNANCY VISIT Patient name: Bailey Palmer MRN [id number]  Date of birth: 1996-06-12 Chief Complaint:   low risk ob  History of Present Illness:   Bailey MinerDominique E Sek is a 22 y.o. 402P1001 female at 938w2d with an Estimated Date of Delivery: 07/04/18 being seen today for ongoing management of a low-risk pregnancy.  Today she reports Lt sciatica. Contractions: Not present.  .  Movement: Present. denies leaking of fluid. Review of Systems:   Pertinent items are noted in HPI Denies abnormal vaginal discharge w/ itching/odor/irritation, headaches, visual changes, shortness of breath, chest pain, abdominal pain, severe nausea/vomiting, or problems with urination or bowel movements unless otherwise stated above. Pertinent History Reviewed:  Reviewed past medical,surgical, social, obstetrical and family history.  Reviewed problem list, medications and allergies. Physical Assessment:   Vitals:   05/18/18 1558  BP: 109/69  Pulse: (!) 113  Weight: 237 lb (107.5 kg)  Body mass index is 41.98 kg/m.        Physical Examination:   General appearance: Well appearing, and in no distress  Mental status: Alert, oriented to person, place, and time  Skin: Warm & dry  Cardiovascular: Normal heart rate noted  Respiratory: Normal respiratory effort, no distress  Abdomen: Soft, gravid, nontender  Pelvic: Cervical exam deferred         Extremities: Edema: None  Fetal Status: Fetal Heart Rate (bpm): 150 Fundal Height: 33 cm Movement: Present    Results for orders placed or performed in visit on 05/18/18 (from the past 24 hour(s))  POCT urinalysis dipstick   Collection Time: 05/18/18  4:05 PM  Result Value Ref Range   Color, UA     Clarity, UA     Glucose, UA Negative Negative   Bilirubin, UA     Ketones, UA moderate    Spec Grav, UA  1.010 - 1.025   Blood, UA neg    pH, UA  5.0 - 8.0   Protein, UA Positive (A) Negative   Urobilinogen, UA  0.2 or 1.0 E.U./dL   Nitrite, UA neg    Leukocytes, UA Negative Negative   Appearance     Odor      Assessment & Plan:  1) Low-risk pregnancy G2P1001 at 8438w2d with an Estimated Date of Delivery: 07/04/18   2) Lt sciatica, gave printed exercises   Meds: No orders of the defined types were placed in this encounter.  Labs/procedures today: none  Plan:  Continue routine obstetrical care   Reviewed: Preterm labor symptoms and general obstetric precautions including but not limited to vaginal bleeding, contractions, leaking of fluid and fetal movement were reviewed in detail with the patient.  All questions were answered  Follow-up: Return in about 2 weeks (around 06/01/2018) for LROB.  Orders Placed This Encounter  Procedures  . POCT urinalysis dipstick   Cheral MarkerKimberly R Basim Bartnik CNM, [facility]NP-BC 05/18/2018 4:21 PM

## 2018-05-18 NOTE — Patient Instructions (Addendum)
Bailey Palmer, I greatly value your feedback.  If you receive a survey following your visit with [place] today, we appreciate you taking the time to fill it out.  Thanks, Joellyn Haff, CNM, WHNP-BC   Call the office [phone]) or go to [facility] if:  You begin to have strong, frequent contractions  Your water breaks.  Sometimes it is a big gush of fluid, sometimes it is just a trickle that keeps getting your panties wet or running down your legs  You have vaginal bleeding.  It is normal to have a small amount of spotting if your cervix was checked.   You don't feel your baby moving like normal.  If you don't, get you something to eat and drink and lay down and focus on feeling your baby move.  You should feel at least 10 movements in 2 hours.  If you don't, you should call the office or go to [facility].    Tdap Vaccine  It is recommended that you get the Tdap vaccine during the third trimester of EACH pregnancy to help protect your baby from getting pertussis (whooping cough)  27-36 weeks is the BEST time to do this so that you can pass the protection on to your baby. During pregnancy is better than after pregnancy, but if you are unable to get it during pregnancy it will be offered at the hospital.   You can get this vaccine at the health department or your family doctor  Everyone who will be around your baby should also be up-to-date on their vaccines. Adults (who are not pregnant) only need 1 dose of Tdap during adulthood.   Third Trimester of Pregnancy The third trimester is from week 29 through week 42, months 7 through 9. The third trimester is a time when the fetus is growing rapidly. At the end of the ninth month, the fetus is about 20 inches in length and weighs 6-10 pounds.  BODY CHANGES Your body goes through many changes during pregnancy. The changes vary from woman to woman.   Your weight will continue to increase. You can expect to gain 25-35 pounds (11-16 kg) by  the end of the pregnancy.  You may begin to get stretch marks on your hips, abdomen, and breasts.  You may urinate more often because the fetus is moving lower into your pelvis and pressing on your bladder.  You may develop or continue to have heartburn as a result of your pregnancy.  You may develop constipation because certain hormones are causing the muscles that push waste through your intestines to slow down.  You may develop hemorrhoids or swollen, bulging veins (varicose veins).  You may have pelvic pain because of the weight gain and pregnancy hormones relaxing your joints between the bones in your pelvis. Backaches may result from overexertion of the muscles supporting your posture.  You may have changes in your hair. These can include thickening of your hair, rapid growth, and changes in texture. Some women also have hair loss during or after pregnancy, or hair that feels dry or thin. Your hair will most likely return to normal after your baby is born.  Your breasts will continue to grow and be tender. A yellow discharge may leak from your breasts called colostrum.  Your belly button may stick out.  You may feel short of breath because of your expanding uterus.  You may notice the fetus "dropping," or moving lower in your abdomen.  You may have a bloody  mucus discharge. This usually occurs a few days to a week before labor begins.  Your cervix becomes thin and soft (effaced) near your due date. WHAT TO EXPECT AT YOUR PRENATAL EXAMS  You will have prenatal exams every 2 weeks until week 36. Then, you will have weekly prenatal exams. During a routine prenatal visit:  You will be weighed to make sure you and the fetus are growing normally.  Your blood pressure is taken.  Your abdomen will be measured to track your baby's growth.  The fetal heartbeat will be listened to.  Any test results from the previous visit will be discussed.  You may have a cervical check near your  due date to see if you have effaced. At around 36 weeks, your caregiver will check your cervix. At the same time, your caregiver will also perform a test on the secretions of the vaginal tissue. This test is to determine if a type of bacteria, Group B streptococcus, is present. Your caregiver will explain this further. Your caregiver may ask you:  What your birth plan is.  How you are feeling.  If you are feeling the baby move.  If you have had any abnormal symptoms, such as leaking fluid, bleeding, severe headaches, or abdominal cramping.  If you have any questions. Other tests or screenings that may be performed during your third trimester include:  Blood tests that check for low iron levels (anemia).  Fetal testing to check the health, activity level, and growth of the fetus. Testing is done if you have certain medical conditions or if there are problems during the pregnancy. FALSE LABOR You may feel small, irregular contractions that eventually go away. These are called Braxton Hicks contractions, or false labor. Contractions may last for hours, days, or even weeks before true labor sets in. If contractions come at regular intervals, intensify, or become painful, it is best to be seen by your caregiver.  SIGNS OF LABOR   Menstrual-like cramps.  Contractions that are 5 minutes apart or less.  Contractions that start on the top of the uterus and spread down to the lower abdomen and back.  A sense of increased pelvic pressure or back pain.  A watery or bloody mucus discharge that comes from the vagina. If you have any of these signs before the 37th week of pregnancy, call your caregiver right away. You need to go to the hospital to get checked immediately. HOME CARE INSTRUCTIONS   Avoid all smoking, herbs, alcohol, and unprescribed drugs. These chemicals affect the formation and growth of the baby.  Follow your caregiver's instructions regarding medicine use. There are medicines  that are either safe or unsafe to take during pregnancy.  Exercise only as directed by your caregiver. Experiencing uterine cramps is a good sign to stop exercising.  Continue to eat regular, healthy meals.  Wear a good support bra for breast tenderness.  Do not use hot tubs, steam rooms, or saunas.  Wear your seat belt at all times when driving.  Avoid raw meat, uncooked cheese, cat litter boxes, and soil used by cats. These carry germs that can cause birth defects in the baby.  Take your prenatal vitamins.  Try taking a stool softener (if your caregiver approves) if you develop constipation. Eat more high-fiber foods, such as fresh vegetables or fruit and whole grains. Drink plenty of fluids to keep your urine clear or pale yellow.  Take warm sitz baths to soothe any pain or discomfort caused by hemorrhoids.  Use hemorrhoid cream if your caregiver approves.  If you develop varicose veins, wear support hose. Elevate your feet for 15 minutes, 3-4 times a day. Limit salt in your diet.  Avoid heavy lifting, wear low heal shoes, and practice good posture.  Rest a lot with your legs elevated if you have leg cramps or low back pain.  Visit your dentist if you have not gone during your pregnancy. Use a soft toothbrush to brush your teeth and be gentle when you floss.  A sexual relationship may be continued unless your caregiver directs you otherwise.  Do not travel far distances unless it is absolutely necessary and only with the approval of your caregiver.  Take prenatal classes to understand, practice, and ask questions about the labor and delivery.  Make a trial run to the hospital.  Pack your hospital bag.  Prepare the baby's nursery.  Continue to go to all your prenatal visits as directed by your caregiver. SEEK MEDICAL CARE IF:  You are unsure if you are in labor or if your water has broken.  You have dizziness.  You have mild pelvic cramps, pelvic pressure, or nagging  pain in your abdominal area.  You have persistent nausea, vomiting, or diarrhea.  You have a bad smelling vaginal discharge.  You have pain with urination. SEEK IMMEDIATE MEDICAL CARE IF:   You have a fever.  You are leaking fluid from your vagina.  You have spotting or bleeding from your vagina.  You have severe abdominal cramping or pain.  You have rapid weight loss or gain.  You have shortness of breath with chest pain.  You notice sudden or extreme swelling of your face, hands, ankles, feet, or legs.  You have not felt your baby move in over an hour.  You have severe headaches that do not go away with medicine.  You have vision changes. Document Released: 11/18/2001 Document Revised: 11/29/2013 Document Reviewed: 01/25/2013 [facility] Patient Information 2015 [employer], [place]. This information is not intended to replace advice given to you by your health care provider. Make sure you discuss any questions you have with your health care provider.   Sciatica Rehab Ask your health care provider which exercises are safe for you. Do exercises exactly as told by your health care provider and adjust them as directed. It is normal to feel mild stretching, pulling, tightness, or discomfort as you do these exercises, but you should stop right away if you feel sudden pain or your pain gets worse.Do not begin these exercises until told by your health care provider. Stretching and range of motion exercises These exercises warm up your muscles and joints and improve the movement and flexibility of your hips and your back. These exercises also help to relieve pain, numbness, and tingling. Exercise A: Sciatic nerve glide 1. Sit in a chair with your head facing down toward your chest. Place your hands behind your back. Let your shoulders slump forward. 2. Slowly straighten one of your knees while you tilt your head back as if you are looking toward the ceiling. Only straighten your leg as far  as you can without making your symptoms worse. 3. Hold for __________ seconds. 4. Slowly return to the starting position. 5. Repeat with your other leg. Repeat __________ times. Complete this exercise __________ times a day. Exercise B: Knee to chest with hip adduction and internal rotation  1. Lie on your back on a firm surface with both legs straight. 2. Bend one of your knees  and move it up toward your chest until you feel a gentle stretch in your lower back and buttock. Then, move your knee toward the shoulder that is on the opposite side from your leg. ? Hold your leg in this position by holding onto the front of your knee. 3. Hold for __________ seconds. 4. Slowly return to the starting position. 5. Repeat with your other leg. Repeat __________ times. Complete this exercise __________ times a day. Exercise C: Prone extension on elbows  1. Lie on your abdomen on a firm surface. A bed may be too soft for this exercise. 2. Prop yourself up on your elbows. 3. Use your arms to help lift your chest up until you feel a gentle stretch in your abdomen and your lower back. ? This will place some of your body weight on your elbows. If this is uncomfortable, try stacking pillows under your chest. ? Your hips should stay down, against the surface that you are lying on. Keep your hip and back muscles relaxed. 4. Hold for __________ seconds. 5. Slowly relax your upper body and return to the starting position. Repeat __________ times. Complete this exercise __________ times a day. Strengthening exercises These exercises build strength and endurance in your back. Endurance is the ability to use your muscles for a long time, even after they get tired. Exercise D: Pelvic tilt 1. Lie on your back on a firm surface. Bend your knees and keep your feet flat. 2. Tense your abdominal muscles. Tip your pelvis up toward the ceiling and flatten your lower back into the floor. ? To help with this exercise, you  may place a small towel under your lower back and try to push your back into the towel. 3. Hold for __________ seconds. 4. Let your muscles relax completely before you repeat this exercise. Repeat __________ times. Complete this exercise __________ times a day. Exercise E: Alternating arm and leg raises  1. Get on your hands and knees on a firm surface. If you are on a hard floor, you may want to use padding to cushion your knees, such as an exercise mat. 2. Line up your arms and legs. Your hands should be below your shoulders, and your knees should be below your hips. 3. Lift your left leg behind you. At the same time, raise your right arm and straighten it in front of you. ? Do not lift your leg higher than your hip. ? Do not lift your arm higher than your shoulder. ? Keep your abdominal and back muscles tight. ? Keep your hips facing the ground. ? Do not arch your back. ? Keep your balance carefully, and do not hold your breath. 4. Hold for __________ seconds. 5. Slowly return to the starting position and repeat with your right leg and your left arm. Repeat __________ times. Complete this exercise __________ times a day. Posture and body mechanics  Body mechanics refers to the movements and positions of your body while you do your daily activities. Posture is part of body mechanics. Good posture and healthy body mechanics can help to relieve stress in your body's tissues and joints. Good posture means that your spine is in its natural S-curve position (your spine is neutral), your shoulders are pulled back slightly, and your head is not tipped forward. The following are general guidelines for applying improved posture and body mechanics to your everyday activities. Standing   When standing, keep your spine neutral and your feet about hip-width apart. Keep a  slight bend in your knees. Your ears, shoulders, and hips should line up.  When you do a task in which you stand in one place for a  long time, place one foot up on a stable object that is 2-4 inches (5-10 cm) high, such as a footstool. This helps keep your spine neutral. Sitting   When sitting, keep your spine neutral and keep your feet flat on the floor. Use a footrest, if necessary, and keep your thighs parallel to the floor. Avoid rounding your shoulders, and avoid tilting your head forward.  When working at a desk or a computer, keep your desk at a height where your hands are slightly lower than your elbows. Slide your chair under your desk so you are close enough to maintain good posture.  When working at a computer, place your monitor at a height where you are looking straight ahead and you do not have to tilt your head forward or downward to look at the screen. Resting   When lying down and resting, avoid positions that are most painful for you.  If you have pain with activities such as sitting, bending, stooping, or squatting (flexion-based activities), lie in a position in which your body does not bend very much. For example, avoid curling up on your side with your arms and knees near your chest (fetal position).  If you have pain with activities such as standing for a long time or reaching with your arms (extension-based activities), lie with your spine in a neutral position and bend your knees slightly. Try the following positions: ? Lying on your side with a pillow between your knees. ? Lying on your back with a pillow under your knees. Lifting   When lifting objects, keep your feet at least shoulder-width apart and tighten your abdominal muscles.  Bend your knees and hips and keep your spine neutral. It is important to lift using the strength of your legs, not your back. Do not lock your knees straight out.  Always ask for help to lift heavy or awkward objects. This information is not intended to replace advice given to you by your health care provider. Make sure you discuss any questions you have with  your health care provider. Document Released: 11/24/2005 Document Revised: 07/31/2016 Document Reviewed: 08/10/2015 Elsevier Interactive Patient Education  [employer].

## 2018-05-19 ENCOUNTER — Encounter: Payer: Medicaid Other | Admitting: Women's Health

## 2018-05-21 ENCOUNTER — Telehealth: Payer: Self-pay | Admitting: *Deleted

## 2018-05-21 NOTE — Telephone Encounter (Signed)
Called with low Hgb 9.4.  LMOVM for patient to make sure she was still taking PNV along with increasing diet in iron rich foods and to add iron tablets twice daily.

## 2018-06-02 ENCOUNTER — Ambulatory Visit (INDEPENDENT_AMBULATORY_CARE_PROVIDER_SITE_OTHER): Payer: Medicaid Other | Admitting: Obstetrics and Gynecology

## 2018-06-02 ENCOUNTER — Encounter: Payer: Self-pay | Admitting: Obstetrics and Gynecology

## 2018-06-02 VITALS — BP 123/72 | HR 97 | Wt 235.0 lb

## 2018-06-02 DIAGNOSIS — Z3A35 35 weeks gestation of pregnancy: Secondary | ICD-10-CM

## 2018-06-02 DIAGNOSIS — Z331 Pregnant state, incidental: Secondary | ICD-10-CM

## 2018-06-02 DIAGNOSIS — Z3483 Encounter for supervision of other normal pregnancy, third trimester: Secondary | ICD-10-CM

## 2018-06-02 DIAGNOSIS — Z309 Encounter for contraceptive management, unspecified: Secondary | ICD-10-CM | POA: Insufficient documentation

## 2018-06-02 DIAGNOSIS — Z3009 Encounter for other general counseling and advice on contraception: Secondary | ICD-10-CM

## 2018-06-02 DIAGNOSIS — Z1389 Encounter for screening for other disorder: Secondary | ICD-10-CM

## 2018-06-02 LAB — POCT URINALYSIS DIPSTICK
GLUCOSE UA: NEGATIVE
Nitrite, UA: NEGATIVE
PROTEIN UA: POSITIVE — AB
RBC UA: NEGATIVE

## 2018-06-02 NOTE — Progress Notes (Signed)
Patient ID: Bailey Palmer, female   DOB: 08/14/1996, 22 y.o.   MRN: [id number]   LOW-RISK PREGNANCY VISIT Patient name: Bailey Palmer MRN [id number]  Date of birth: 08/14/1996 Chief Complaint:   Routine Prenatal Visit  History of Present Illness:   Bailey Palmer is a 22 y.o. 762P1001 female at 2072w3d with an Estimated Date of Delivery: 07/04/18 being seen today for ongoing management of a low-risk pregnancy.  Today she reports no complaints   Not planning on breast feeding. She would like the copper IUD after giving birth. Contractions: Not present. Vag. Bleeding: None.  Movement: Present. denies leaking of fluid. Review of Systems:   Pertinent items are noted in HPI Denies abnormal vaginal discharge w/ itching/odor/irritation, headaches, visual changes, shortness of breath, chest pain, abdominal pain, severe nausea/vomiting, or problems with urination or bowel movements unless otherwise stated above. Pertinent History Reviewed:  Reviewed past medical,surgical, social, obstetrical and family history.  Reviewed problem list, medications and allergies. Physical Assessment:   Vitals:   06/02/18 1616  BP: 123/72  Pulse: 97  Weight: 235 lb (106.6 kg)  Body mass index is 41.63 kg/m.        Physical Examination:   General appearance: Well appearing, and in no distress  Mental status: Alert, oriented to person, place, and time  Skin: Warm & dry  Cardiovascular: Normal heart rate noted  Respiratory: Normal respiratory effort, no distress  Abdomen: Soft, gravid, nontender  Pelvic: Cervical exam deferred         Extremities: Edema: Trace  Fetal Status: Fetal Heart Rate (bpm): 147 Fundal Height: 36 cm Movement: Present    Results for orders placed or performed in visit on 06/02/18 (from the past 24 hour(s))  POCT urinalysis dipstick   Collection Time: 06/02/18  4:17 PM  Result Value Ref Range   Color, UA     Clarity, UA     Glucose, UA Negative Negative   Bilirubin, UA     Ketones, UA 4+    Spec Grav, UA  1.010 - 1.025   Blood, UA neg    pH, UA  5.0 - 8.0   Protein, UA Positive (A) Negative   Urobilinogen, UA  0.2 or 1.0 E.U./dL   Nitrite, UA neg    Leukocytes, UA Trace (A) Negative   Appearance     Odor      Assessment & Plan:  1) Low-risk pregnancy G2P1001 at 3372w3d with an Estimated Date of Delivery: 07/04/18     Meds: No orders of the defined types were placed in this encounter.  Labs/procedures today: none  Plan:  Continue routine obstetrical care   Follow-up: Return in about 2 weeks (around 06/16/2018) for LROB, GC/Chlamydia, GBS.  Orders Placed This Encounter  Procedures  . POCT urinalysis dipstick   By signing my name below, I, Diona BrownerJennifer Gorman, attest that this documentation has been prepared under the direction and in the presence of Tilda BurrowFerguson, Nikira Kushnir V, MD. Electronically Signed: Diona BrownerJennifer Gorman, Medical Scribe. 06/02/18. 4:52 PM.  I personally performed the services described in this documentation, which was SCRIBED in my presence. The recorded information has been reviewed and considered accurate. It has been edited as necessary during review. Tilda BurrowJohn V Alder Murri, MD

## 2018-06-07 ENCOUNTER — Telehealth: Payer: Self-pay | Admitting: Women's Health

## 2018-06-07 MED ORDER — PROMETHAZINE HCL 25 MG PO TABS: 12.5000 mg | ORAL_TABLET | Freq: Four times a day (QID) | ORAL | 0 refills | Status: DC | PRN

## 2018-06-07 NOTE — Telephone Encounter (Signed)
LMOVM that phenergan was sent.  Advised to drink fluids and monitor for contractions, leaking, bleeding.

## 2018-06-07 NOTE — Telephone Encounter (Signed)
Patient states she has been vomiting since 2am.  Unsure if it is viral or something she ate.  Wanted to be seen but advised patient we did not need to see her for vomiting and would just need to run its course.  Encouraged to sip fluids to avoid dehydration and practice good hand hygiene.  Requesting nausea medication as she has not been prescribed any in the past.  Please advise.

## 2018-06-07 NOTE — Telephone Encounter (Signed)
Pt is 36 weeks unable to keep anything down / and vomiting/ since 2:00 this am. Please advise

## 2018-06-16 ENCOUNTER — Ambulatory Visit (INDEPENDENT_AMBULATORY_CARE_PROVIDER_SITE_OTHER): Payer: Medicaid Other | Admitting: Advanced Practice Midwife

## 2018-06-16 VITALS — BP 118/78 | HR 94 | Wt 237.0 lb

## 2018-06-16 DIAGNOSIS — Z3A38 38 weeks gestation of pregnancy: Secondary | ICD-10-CM | POA: Diagnosis not present

## 2018-06-16 DIAGNOSIS — Z3483 Encounter for supervision of other normal pregnancy, third trimester: Secondary | ICD-10-CM | POA: Diagnosis not present

## 2018-06-16 DIAGNOSIS — Z331 Pregnant state, incidental: Secondary | ICD-10-CM

## 2018-06-16 DIAGNOSIS — O1213 Gestational proteinuria, third trimester: Secondary | ICD-10-CM

## 2018-06-16 DIAGNOSIS — Z3A37 37 weeks gestation of pregnancy: Secondary | ICD-10-CM

## 2018-06-16 DIAGNOSIS — Z1389 Encounter for screening for other disorder: Secondary | ICD-10-CM

## 2018-06-16 LAB — POCT URINALYSIS DIPSTICK
Blood, UA: NEGATIVE
Glucose, UA: NEGATIVE
Nitrite, UA: NEGATIVE
Protein, UA: POSITIVE — AB

## 2018-06-16 LAB — OB RESULTS CONSOLE GBS: GBS: NEGATIVE

## 2018-06-16 NOTE — Patient Instructions (Signed)

## 2018-06-16 NOTE — Progress Notes (Signed)
  G2P1001 758w3d Estimated Date of Delivery: 07/04/18  Blood pressure 118/78, pulse 94, weight 237 lb (107.5 kg), last menstrual period 09/27/2017, not currently breastfeeding.   BP weight and urine results all reviewed and noted.  Please refer to the obstetrical flow sheet for the fundal height and fetal heart rate documentation:  Patient reports good fetal movement, denies any bleeding and no rupture of membranes symptoms or regular contractions. Patient is without complaints. All questions were answered.   Physical Assessment:   Vitals:   06/16/18 1514  BP: 118/78  Pulse: 94  Weight: 237 lb (107.5 kg)  Body mass index is 41.98 kg/m.   Denies UTI sx        Physical Examination:   General appearance: Well appearing, and in no distress  Mental status: Alert, oriented to person, place, and time  Skin: Warm & dry  Cardiovascular: Normal heart rate noted  Respiratory: Normal respiratory effort, no distress  Abdomen: Soft, gravid, nontender  Pelvic: Cervical exam performed  Dilation: Closed Effacement (%): Thick Station: -3  Extremities: Edema: Trace  Fetal Status: Fetal Heart Rate (bpm): 132 Fundal Height: 37 cm Movement: Present Presentation: Vertex  Results for orders placed or performed in visit on 06/16/18 (from the past 24 hour(s))  POCT Urinalysis Dipstick   Collection Time: 06/16/18  3:17 PM  Result Value Ref Range   Color, UA     Clarity, UA     Glucose, UA Negative Negative   Bilirubin, UA     Ketones, UA large    Spec Grav, UA  1.010 - 1.025   Blood, UA neg    pH, UA  5.0 - 8.0   Protein, UA Positive (A) Negative   Urobilinogen, UA  0.2 or 1.0 E.U./dL   Nitrite, UA neg    Leukocytes, UA Small (1+) (A) Negative   Appearance     Odor       Orders Placed This Encounter  Procedures  . GC/Chlamydia Probe Amp([facility])  . Culture, beta strep (group b only)  . Urine Culture  . POCT Urinalysis Dipstick    Plan:  Continued routine obstetrical care,    Return in about 1 week (around 06/23/2018) for LROB; order Paragard.

## 2018-06-18 LAB — URINE CULTURE

## 2018-06-18 LAB — GC/CHLAMYDIA PROBE AMP
Chlamydia trachomatis, NAA: NEGATIVE
Neisseria gonorrhoeae by PCR: NEGATIVE

## 2018-06-20 LAB — CULTURE, BETA STREP (GROUP B ONLY): STREP GP B CULTURE: NEGATIVE

## 2018-06-21 ENCOUNTER — Telehealth: Payer: Self-pay | Admitting: Advanced Practice Midwife

## 2018-06-21 NOTE — Telephone Encounter (Signed)
Please call with DX icd [phone] for lab

## 2018-06-23 ENCOUNTER — Ambulatory Visit (INDEPENDENT_AMBULATORY_CARE_PROVIDER_SITE_OTHER): Payer: Medicaid Other | Admitting: Advanced Practice Midwife

## 2018-06-23 ENCOUNTER — Telehealth: Payer: Self-pay | Admitting: *Deleted

## 2018-06-23 ENCOUNTER — Encounter: Payer: Self-pay | Admitting: Advanced Practice Midwife

## 2018-06-23 ENCOUNTER — Encounter: Payer: Self-pay | Admitting: *Deleted

## 2018-06-23 VITALS — BP 122/74 | HR 93 | Wt 234.0 lb

## 2018-06-23 DIAGNOSIS — Z1389 Encounter for screening for other disorder: Secondary | ICD-10-CM

## 2018-06-23 DIAGNOSIS — Z3A38 38 weeks gestation of pregnancy: Secondary | ICD-10-CM

## 2018-06-23 DIAGNOSIS — Z3483 Encounter for supervision of other normal pregnancy, third trimester: Secondary | ICD-10-CM

## 2018-06-23 DIAGNOSIS — Z331 Pregnant state, incidental: Secondary | ICD-10-CM

## 2018-06-23 LAB — POCT URINALYSIS DIPSTICK
Glucose, UA: NEGATIVE
Ketones, UA: NEGATIVE
LEUKOCYTES UA: NEGATIVE
NITRITE UA: NEGATIVE
PROTEIN UA: POSITIVE — AB
RBC UA: NEGATIVE

## 2018-06-23 NOTE — Progress Notes (Signed)
  G2P1001 146w3d Estimated Date of Delivery: 07/04/18  Blood pressure 122/74, pulse 93, weight 234 lb (106.1 kg), last menstrual period 09/27/2017, not currently breastfeeding.   BP weight and urine results all reviewed and noted.  Please refer to the obstetrical flow sheet for the fundal height and fetal heart rate documentation:  Patient reports good fetal movement, denies any bleeding and no rupture of membranes symptoms or regular contractions. Patient is without complaints. All questions were answered.   Physical Assessment:   Vitals:   06/23/18 0937  BP: 122/74  Pulse: 93  Weight: 234 lb (106.1 kg)  Body mass index is 41.45 kg/m.        Physical Examination:   General appearance: Well appearing, and in no distress  Mental status: Alert, oriented to person, place, and time  Skin: Warm & dry  Cardiovascular: Normal heart rate noted  Respiratory: Normal respiratory effort, no distress  Abdomen: Soft, gravid, nontender  Pelvic: Cervical exam deferred         Extremities: Edema: Trace  Fetal Status: Fetal Heart Rate (bpm): 136 Fundal Height: 38 cm Movement: Present    Results for orders placed or performed in visit on 06/23/18 (from the past 24 hour(s))  POCT urinalysis dipstick   Collection Time: 06/23/18  9:47 AM  Result Value Ref Range   Color, UA     Clarity, UA     Glucose, UA Negative Negative   Bilirubin, UA     Ketones, UA neg    Spec Grav, UA  1.010 - 1.025   Blood, UA neg    pH, UA  5.0 - 8.0   Protein, UA Positive (A) Negative   Urobilinogen, UA  0.2 or 1.0 E.U./dL   Nitrite, UA neg    Leukocytes, UA Negative Negative   Appearance     Odor       Orders Placed This Encounter  Procedures  . POCT urinalysis dipstick    Plan:  Continued routine obstetrical care,   Return in about 1 week (around 06/30/2018) for LROB.

## 2018-06-30 ENCOUNTER — Ambulatory Visit (INDEPENDENT_AMBULATORY_CARE_PROVIDER_SITE_OTHER): Payer: Medicaid Other | Admitting: Women's Health

## 2018-06-30 ENCOUNTER — Encounter: Payer: Self-pay | Admitting: Women's Health

## 2018-06-30 VITALS — BP 107/65 | HR 85 | Wt 234.0 lb

## 2018-06-30 DIAGNOSIS — O480 Post-term pregnancy: Secondary | ICD-10-CM

## 2018-06-30 DIAGNOSIS — Z3483 Encounter for supervision of other normal pregnancy, third trimester: Secondary | ICD-10-CM

## 2018-06-30 DIAGNOSIS — Z331 Pregnant state, incidental: Secondary | ICD-10-CM

## 2018-06-30 DIAGNOSIS — Z3A39 39 weeks gestation of pregnancy: Secondary | ICD-10-CM | POA: Diagnosis not present

## 2018-06-30 DIAGNOSIS — Z1389 Encounter for screening for other disorder: Secondary | ICD-10-CM

## 2018-06-30 LAB — POCT URINALYSIS DIPSTICK OB
GLUCOSE, UA: NEGATIVE — AB
Nitrite, UA: NEGATIVE
POC,PROTEIN,UA: NEGATIVE

## 2018-06-30 NOTE — Patient Instructions (Signed)
Bailey Palmer, I greatly value your feedback.  If you receive a survey following your visit with us today, we apprecGriselda Mineriate you taking the time to fill it out.  Thanks, Bailey HaffKim Whittley Palmer, CNM, WHNP-BC   Call the office [phone]) or go to [facility] if:  You begin to have strong, frequent contractions  Your water breaks.  Sometimes it is a big gush of fluid, sometimes it is just a trickle that keeps getting your panties wet or running down your legs  You have vaginal bleeding.  It is normal to have a small amount of spotting if your cervix was checked.   You don't feel your baby moving like normal.  If you don't, get you something to eat and drink and lay down and focus on feeling your baby move.  You should feel at least 10 movements in 2 hours.  If you don't, you should call the office or go to [facility].   [facility] Contractions Contractions of the uterus can occur throughout pregnancy, but they are not always a sign that you are in labor. You may have practice contractions called Braxton Hicks contractions. These false labor contractions are sometimes confused with true labor. What are Bailey PeltonBraxton Hicks contractions? Braxton Hicks contractions are tightening movements that occur in the muscles of the uterus before labor. Unlike true labor contractions, these contractions do not result in opening (dilation) and thinning of the cervix. Toward the end of pregnancy (32-34 weeks), Braxton Hicks contractions can happen more often and may become stronger. These contractions are sometimes difficult to tell apart from true labor because they can be very uncomfortable. You should not feel embarrassed if you go to the hospital with false labor. Sometimes, the only way to tell if you are in true labor is for your health care provider to look for changes in the cervix. The health care provider will do a physical exam and may monitor your contractions. If you are not in true labor, the exam should show  that your cervix is not dilating and your water has not broken. If there are other health problems associated with your pregnancy, it is completely safe for you to be sent home with false labor. You may continue to have Braxton Hicks contractions until you go into true labor. How to tell the difference between true labor and false labor True labor  Contractions last 30-70 seconds.  Contractions become very regular.  Discomfort is usually felt in the top of the uterus, and it spreads to the lower abdomen and low back.  Contractions do not go away with walking.  Contractions usually become more intense and increase in frequency.  The cervix dilates and gets thinner. False labor  Contractions are usually shorter and not as strong as true labor contractions.  Contractions are usually irregular.  Contractions are often felt in the front of the lower abdomen and in the groin.  Contractions may go away when you walk around or change positions while lying down.  Contractions get weaker and are shorter-lasting as time goes on.  The cervix usually does not dilate or become thin. Follow these instructions at home:  Take over-the-counter and prescription medicines only as told by your health care provider.  Keep up with your usual exercises and follow other instructions from your health care provider.  Eat and drink lightly if you think you are going into labor.  If Braxton Hicks contractions are making you uncomfortable: ? Change your position from lying down or  resting to walking, or change from walking to resting. ? Sit and rest in a tub of warm water. ? Drink enough fluid to keep your urine pale yellow. Dehydration may cause these contractions. ? Do slow and deep breathing several times an hour.  Keep all follow-up prenatal visits as told by your health care provider. This is important. Contact a health care provider if:  You have a fever.  You have continuous pain in your  abdomen. Get help right away if:  Your contractions become stronger, more regular, and closer together.  You have fluid leaking or gushing from your vagina.  You pass blood-tinged mucus (bloody show).  You have bleeding from your vagina.  You have low back pain that you never had before.  You feel your baby's head pushing down and causing pelvic pressure.  Your baby is not moving inside you as much as it used to. Summary  Contractions that occur before labor are called Braxton Hicks contractions, false labor, or practice contractions.  Braxton Hicks contractions are usually shorter, weaker, farther apart, and less regular than true labor contractions. True labor contractions usually become progressively stronger and regular and they become more frequent.  Manage discomfort from [facility] contractions by changing position, resting in a warm bath, drinking plenty of water, or practicing deep breathing. This information is not intended to replace advice given to you by your health care provider. Make sure you discuss any questions you have with your health care provider. Document Released: 04/09/2017 Document Revised: 04/09/2017 Document Reviewed: 04/09/2017 Elsevier Interactive Patient Education  2018 [employer]

## 2018-06-30 NOTE — Progress Notes (Signed)
   LOW-RISK PREGNANCY VISIT Patient name: Bailey Palmer Boza MRN [id number]  Date of birth: 03-30-1996 Chief Complaint:   Routine Prenatal Visit  History of Present Illness:   Bailey Palmer Asper is a 22 y.o. 522P1001 female at 2237w3d with an Estimated Date of Delivery: 07/04/18 being seen today for ongoing management of a low-risk pregnancy.  Today she reports no complaints. Contractions: Irregular. Vag. Bleeding: None.  Movement: Present. denies leaking of fluid. Review of Systems:   Pertinent items are noted in HPI Denies abnormal vaginal discharge w/ itching/odor/irritation, headaches, visual changes, shortness of breath, chest pain, abdominal pain, severe nausea/vomiting, or problems with urination or bowel movements unless otherwise stated above. Pertinent History Reviewed:  Reviewed past medical,surgical, social, obstetrical and family history.  Reviewed problem list, medications and allergies. Physical Assessment:   Vitals:   06/30/18 1025  BP: 107/65  Pulse: 85  Weight: 234 lb (106.1 kg)  Body mass index is 41.45 kg/m.        Physical Examination:   General appearance: Well appearing, and in no distress  Mental status: Alert, oriented to person, place, and time  Skin: Warm & dry  Cardiovascular: Normal heart rate noted  Respiratory: Normal respiratory effort, no distress  Abdomen: Soft, gravid, nontender  Pelvic: Cervical exam deferred, pt declines, vtx by Leopold's         Extremities: Edema: Trace  Fetal Status: Fetal Heart Rate (bpm): 130 Fundal Height: 39 cm Movement: Present Presentation: Vertex  Results for orders placed or performed in visit on 06/30/18 (from the past 24 hour(s))  POC Urinalysis Dipstick OB   Collection Time: 06/30/18 10:27 AM  Result Value Ref Range   Color, UA     Clarity, UA     Glucose, UA Negative (A) (none)   Bilirubin, UA     Ketones, UA trace    Spec Grav, UA  1.010 - 1.025   Blood, UA trace    pH, UA  5.0 - 8.0   POC Protein UA Negative  Negative, Trace   Urobilinogen, UA  0.2 or 1.0 Palmer.U./dL   Nitrite, UA neg    Leukocytes, UA Moderate (2+) (A) Negative   Appearance     Odor      Assessment & Plan:  1) Low-risk pregnancy G2P1001 at 137w3d with an Estimated Date of Delivery: 07/04/18    Meds: No orders of the defined types were placed in this encounter.  Labs/procedures today: none  Plan:  Continue routine obstetrical care, bpp next week for postdates  Reviewed: Term labor symptoms and general obstetric precautions including but not limited to vaginal bleeding, contractions, leaking of fluid and fetal movement were reviewed in detail with the patient.  All questions were answered  Follow-up: Return in about 1 week (around 07/07/2018) for LROB, US:BPP.  Orders Placed This Encounter  Procedures  . US FETAL BPP WO NON STRESS  . POC Urinalysis Dipstick OB   Cheral MarkerKimberly R Jared Cahn CNM, [facility]NP-BC 06/30/2018 11:03 AM

## 2018-07-01 DIAGNOSIS — L259 Unspecified contact dermatitis, unspecified cause: Secondary | ICD-10-CM | POA: Diagnosis not present

## 2018-07-01 DIAGNOSIS — Z1389 Encounter for screening for other disorder: Secondary | ICD-10-CM | POA: Diagnosis not present

## 2018-07-01 DIAGNOSIS — Z6841 Body Mass Index (BMI) 40.0 and over, adult: Secondary | ICD-10-CM | POA: Diagnosis not present

## 2018-07-04 ENCOUNTER — Inpatient Hospital Stay (HOSPITAL_COMMUNITY): Payer: Medicaid Other | Admitting: Anesthesiology

## 2018-07-04 ENCOUNTER — Encounter (HOSPITAL_COMMUNITY): Payer: Self-pay

## 2018-07-04 ENCOUNTER — Inpatient Hospital Stay (HOSPITAL_COMMUNITY)
Admission: AD | Admit: 2018-07-04 | Discharge: 2018-07-06 | DRG: 807 | Disposition: A | Discharge status: Home / Self Care | Payer: Medicaid Other | Attending: Obstetrics & Gynecology | Admitting: Obstetrics & Gynecology

## 2018-07-04 ENCOUNTER — Other Ambulatory Visit: Payer: Self-pay

## 2018-07-04 DIAGNOSIS — O99214 Obesity complicating childbirth: Secondary | ICD-10-CM | POA: Diagnosis present

## 2018-07-04 DIAGNOSIS — O4292 Full-term premature rupture of membranes, unspecified as to length of time between rupture and onset of labor: Principal | ICD-10-CM | POA: Diagnosis present

## 2018-07-04 DIAGNOSIS — Z283 Underimmunization status: Secondary | ICD-10-CM

## 2018-07-04 DIAGNOSIS — O4290 Premature rupture of membranes, unspecified as to length of time between rupture and onset of labor, unspecified weeks of gestation: Secondary | ICD-10-CM | POA: Diagnosis present

## 2018-07-04 DIAGNOSIS — Z3A40 40 weeks gestation of pregnancy: Secondary | ICD-10-CM | POA: Diagnosis not present

## 2018-07-04 DIAGNOSIS — D509 Iron deficiency anemia, unspecified: Secondary | ICD-10-CM | POA: Diagnosis present

## 2018-07-04 DIAGNOSIS — Z2839 Other underimmunization status: Secondary | ICD-10-CM

## 2018-07-04 DIAGNOSIS — O9902 Anemia complicating childbirth: Secondary | ICD-10-CM | POA: Diagnosis present

## 2018-07-04 DIAGNOSIS — O9989 Other specified diseases and conditions complicating pregnancy, childbirth and the puerperium: Secondary | ICD-10-CM

## 2018-07-04 DIAGNOSIS — Z3483 Encounter for supervision of other normal pregnancy, third trimester: Secondary | ICD-10-CM

## 2018-07-04 LAB — POCT FERN TEST: POCT Fern Test: POSITIVE

## 2018-07-04 LAB — CBC
HCT: 32 % — ABNORMAL LOW (ref 36.0–46.0)
Hemoglobin: 10.2 g/dL — ABNORMAL LOW (ref 12.0–15.0)
MCH: 23.9 pg — ABNORMAL LOW (ref 26.0–34.0)
MCHC: 31.9 g/dL (ref 30.0–36.0)
MCV: 75.1 fL — ABNORMAL LOW (ref 78.0–100.0)
Platelets: 280 10*3/uL (ref 150–400)
RBC: 4.26 MIL/uL (ref 3.87–5.11)
RDW: 16.8 % — ABNORMAL HIGH (ref 11.5–15.5)
WBC: 10.1 10*3/uL (ref 4.0–10.5)

## 2018-07-04 LAB — TYPE AND SCREEN
ABO/RH(D): O POS
Antibody Screen: NEGATIVE

## 2018-07-04 LAB — RPR: RPR Ser Ql: NONREACTIVE

## 2018-07-04 MED ORDER — OXYCODONE-ACETAMINOPHEN 5-325 MG PO TABS
2.0000 | ORAL_TABLET | ORAL | Status: DC | PRN
Start: 2018-07-04 — End: 2018-07-04

## 2018-07-04 MED ORDER — DIBUCAINE 1 % RE OINT: 1.0000 "application " | TOPICAL_OINTMENT | RECTAL | Status: DC | PRN

## 2018-07-04 MED ORDER — COCONUT OIL OIL
1.0000 "application " | TOPICAL_OIL | Status: DC | PRN
  Administered 2018-07-05: 1 via TOPICAL
  Filled 2018-07-04: qty 120

## 2018-07-04 MED ORDER — FENTANYL CITRATE (PF) 100 MCG/2ML IJ SOLN: 100.0000 ug | INTRAMUSCULAR | Status: DC | PRN

## 2018-07-04 MED ORDER — PHENYLEPHRINE 40 MCG/ML (10ML) SYRINGE FOR IV PUSH (FOR BLOOD PRESSURE SUPPORT)
80.0000 ug | PREFILLED_SYRINGE | INTRAVENOUS | Status: DC | PRN
  Filled 2018-07-04: qty 5

## 2018-07-04 MED ORDER — WITCH HAZEL-GLYCERIN EX PADS
1.0000 "application " | MEDICATED_PAD | CUTANEOUS | Status: DC | PRN
  Administered 2018-07-04: 1 via TOPICAL

## 2018-07-04 MED ORDER — LACTATED RINGERS IV SOLN: 500.0000 mL | INTRAVENOUS | Status: DC | PRN

## 2018-07-04 MED ORDER — ONDANSETRON HCL 4 MG PO TABS: 4.0000 mg | ORAL_TABLET | ORAL | Status: DC | PRN

## 2018-07-04 MED ORDER — PHENYLEPHRINE 40 MCG/ML (10ML) SYRINGE FOR IV PUSH (FOR BLOOD PRESSURE SUPPORT)
80.0000 ug | PREFILLED_SYRINGE | INTRAVENOUS | Status: DC | PRN
  Filled 2018-07-04: qty 10
  Filled 2018-07-04: qty 5

## 2018-07-04 MED ORDER — BENZOCAINE-MENTHOL 20-0.5 % EX AERO
1.0000 "application " | INHALATION_SPRAY | CUTANEOUS | Status: DC | PRN
  Administered 2018-07-04: 1 via TOPICAL
  Filled 2018-07-04: qty 56

## 2018-07-04 MED ORDER — DIPHENHYDRAMINE HCL 25 MG PO CAPS: 25.0000 mg | ORAL_CAPSULE | Freq: Four times a day (QID) | ORAL | Status: DC | PRN

## 2018-07-04 MED ORDER — OXYTOCIN 40 UNITS IN LACTATED RINGERS INFUSION - SIMPLE MED: 2.5000 [IU]/h | INTRAVENOUS | Status: DC

## 2018-07-04 MED ORDER — FENTANYL 2.5 MCG/ML BUPIVACAINE 1/10 % EPIDURAL INFUSION (WH - ANES)
14.0000 mL/h | INTRAMUSCULAR | Status: DC | PRN
  Administered 2018-07-04: 14 mL/h via EPIDURAL
  Filled 2018-07-04: qty 100

## 2018-07-04 MED ORDER — PRENATAL MULTIVITAMIN CH
1.0000 | ORAL_TABLET | Freq: Every day | ORAL | Status: DC
  Administered 2018-07-05 – 2018-07-06 (×2): 1 via ORAL
  Filled 2018-07-04 (×2): qty 1

## 2018-07-04 MED ORDER — LACTATED RINGERS IV SOLN
INTRAVENOUS | Status: DC
  Administered 2018-07-04 (×2): via INTRAVENOUS

## 2018-07-04 MED ORDER — ONDANSETRON HCL 4 MG/2ML IJ SOLN: 4.0000 mg | Freq: Four times a day (QID) | INTRAMUSCULAR | Status: DC | PRN

## 2018-07-04 MED ORDER — SENNOSIDES-DOCUSATE SODIUM 8.6-50 MG PO TABS
2.0000 | ORAL_TABLET | ORAL | Status: DC
  Administered 2018-07-04 – 2018-07-05 (×2): 2 via ORAL
  Filled 2018-07-04 (×2): qty 2

## 2018-07-04 MED ORDER — LIDOCAINE HCL (PF) 1 % IJ SOLN
30.0000 mL | INTRAMUSCULAR | Status: DC | PRN
  Filled 2018-07-04: qty 30

## 2018-07-04 MED ORDER — SIMETHICONE 80 MG PO CHEW: 80.0000 mg | CHEWABLE_TABLET | ORAL | Status: DC | PRN

## 2018-07-04 MED ORDER — ACETAMINOPHEN 325 MG PO TABS
650.0000 mg | ORAL_TABLET | ORAL | Status: DC | PRN
  Administered 2018-07-04: 650 mg via ORAL
  Filled 2018-07-04: qty 2

## 2018-07-04 MED ORDER — LIDOCAINE HCL (PF) 1 % IJ SOLN
INTRAMUSCULAR | Status: DC | PRN
  Administered 2018-07-04 (×2): 6 mL via EPIDURAL

## 2018-07-04 MED ORDER — IBUPROFEN 600 MG PO TABS
600.0000 mg | ORAL_TABLET | Freq: Four times a day (QID) | ORAL | Status: DC
  Administered 2018-07-04 – 2018-07-06 (×8): 600 mg via ORAL
  Filled 2018-07-04 (×8): qty 1

## 2018-07-04 MED ORDER — ONDANSETRON HCL 4 MG/2ML IJ SOLN: 4.0000 mg | INTRAMUSCULAR | Status: DC | PRN

## 2018-07-04 MED ORDER — SOD CITRATE-CITRIC ACID 500-334 MG/5ML PO SOLN: 30.0000 mL | ORAL | Status: DC | PRN

## 2018-07-04 MED ORDER — EPHEDRINE 5 MG/ML INJ
10.0000 mg | INTRAVENOUS | Status: DC | PRN
  Filled 2018-07-04: qty 2

## 2018-07-04 MED ORDER — OXYTOCIN 40 UNITS IN LACTATED RINGERS INFUSION - SIMPLE MED
1.0000 m[IU]/min | INTRAVENOUS | Status: DC
  Administered 2018-07-04: 2 m[IU]/min via INTRAVENOUS
  Filled 2018-07-04: qty 1000

## 2018-07-04 MED ORDER — ACETAMINOPHEN 325 MG PO TABS: 650.0000 mg | ORAL_TABLET | ORAL | Status: DC | PRN

## 2018-07-04 MED ORDER — TETANUS-DIPHTH-ACELL PERTUSSIS 5-2.5-18.5 LF-MCG/0.5 IM SUSP: 0.5000 mL | Freq: Once | INTRAMUSCULAR | Status: DC

## 2018-07-04 MED ORDER — TERBUTALINE SULFATE 1 MG/ML IJ SOLN
0.2500 mg | Freq: Once | INTRAMUSCULAR | Status: DC | PRN
  Filled 2018-07-04: qty 1

## 2018-07-04 MED ORDER — ZOLPIDEM TARTRATE 5 MG PO TABS: 5.0000 mg | ORAL_TABLET | Freq: Every evening | ORAL | Status: DC | PRN

## 2018-07-04 MED ORDER — OXYTOCIN BOLUS FROM INFUSION
500.0000 mL | Freq: Once | INTRAVENOUS | Status: AC
  Administered 2018-07-04: 500 mL via INTRAVENOUS

## 2018-07-04 MED ORDER — OXYCODONE-ACETAMINOPHEN 5-325 MG PO TABS
1.0000 | ORAL_TABLET | ORAL | Status: DC | PRN
Start: 2018-07-04 — End: 2018-07-04

## 2018-07-04 MED ORDER — DIPHENHYDRAMINE HCL 50 MG/ML IJ SOLN: 12.5000 mg | INTRAMUSCULAR | Status: DC | PRN

## 2018-07-04 MED ORDER — LACTATED RINGERS IV SOLN
500.0000 mL | Freq: Once | INTRAVENOUS | Status: AC
  Administered 2018-07-04: 500 mL via INTRAVENOUS

## 2018-07-04 NOTE — Progress Notes (Signed)
Griselda MinerDominique E Rauh is a 22 y.o. G2P1001 at 4714w0d by ultrasound admitted for rupture of membranes  Subjective:   Objective: BP 112/76   Pulse 100   Temp 98.9 F (37.2 C) (Oral)   Resp 17   Ht 5\' 3"  (1.6 m)   Wt 232 lb (105.2 kg)   LMP 09/27/2017 (Approximate)   SpO2 100%   BMI 41.10 kg/m  No intake/output data recorded. No intake/output data recorded.  FHT:  FHR: 140's bpm, variability: moderate,  accelerations:  Present,  decelerations:  Present early UC:   regular, every 5 minutes and mild SVE:   Dilation: 5 Effacement (%): 70, 80 Station: -2 Exam by:: Zerita Boersarlene Fahmida Jurich, CNM  Labs: Lab Results  Component Value Date   WBC 10.1 07/04/2018   HGB 10.2 (L) 07/04/2018   HCT 32.0 (L) 07/04/2018   MCV 75.1 (L) 07/04/2018   PLT 280 07/04/2018    Assessment / Plan: Augmentation of labor, progressing well  Labor: Progressing normally Preeclampsia:  no signs or symptoms of toxicity Fetal Wellbeing:  Category I Pain Control:  Epidural I/D:  n/a Anticipated MOD:  NSVD  Wyvonnia DuskyMarie Amadou Katzenstein 07/04/2018, 9:33 AM

## 2018-07-04 NOTE — Progress Notes (Signed)
V. Rogers CNM notified of pt's admission and status. Aware of srom, ctx pattern, ok to ck cervix. [facility]

## 2018-07-04 NOTE — H&P (Signed)
LABOR AND DELIVERY ADMISSION HISTORY AND PHYSICAL NOTE  Bailey Palmer is a 22 y.o. female G2P1001 with IUP at 6837w0d by LMP c/w 1st trimester US presenting for PROM @0100 - clear fluid. She denies abdominal pain or contractions prior to rupture of membranes, reports contractions started once she presented to [facility]. She reports positive fetal movement. She denies vaginal bleeding.  Prenatal History/Complications: PNC at [facility] Pregnancy complications:  - Rubella non immune status, antepartum  - Ptyalism   Past Medical History: Past Medical History:  Diagnosis Date  . Anemia    IDA  . Chronic abdominal pain   . Cyst near tailbone   . DUB (dysfunctional uterine bleeding)   . Heart murmur     Past Surgical History: Past Surgical History:  Procedure Laterality Date  . APPENDECTOMY    . CHOLECYSTECTOMY    . MASS EXCISION N/A 04/24/2017   Procedure: EXCISION PILONDIAL CYST;  Surgeon: Franky MachoJenkins, Mark, MD;  Location: [facility];  Service: General;  Laterality: N/A;    Obstetrical History: OB History    Gravida  2   Para  1   Term  1   Preterm      AB      Living  1     SAB      TAB      Ectopic      Multiple  0   Live Births  1           Social History: Social History   Socioeconomic History  . Marital status: Single    Spouse name: Not on file  . Number of children: Not on file  . Years of education: Not on file  . Highest education level: Not on file  Occupational History  . Not on file  Social Needs  . Financial resource strain: Not on file  . Food insecurity:    Worry: Not on file    Inability: Not on file  . Transportation needs:    Medical: Not on file    Non-medical: Not on file  Tobacco Use  . Smoking status: Never Smoker  . Smokeless tobacco: Never Used  Substance and Sexual Activity  . Alcohol use: No    Alcohol/week: 0.0 oz  . Drug use: No  . Sexual activity: Yes    Birth control/protection: None  Lifestyle  . Physical activity:    Days  per week: Not on file    Minutes per session: Not on file  . Stress: Not on file  Relationships  . Social connections:    Talks on phone: Not on file    Gets together: Not on file    Attends religious service: Not on file    Active member of club or organization: Not on file    Attends meetings of clubs or organizations: Not on file    Relationship status: Not on file  Other Topics Concern  . Not on file  Social History Narrative  . Not on file    Family History: Family History  Problem Relation Age of Onset  . Asthma Mother   . Miscarriages / [place]birth[place] Mother   . Varicose Veins Mother   . Alpha-1 antitrypsin deficiency Mother        carrier  . ADD / ADHD Brother   . Diabetes Maternal Grandmother   . Hypertension Maternal Grandmother   . Heart disease Maternal Grandmother   . Asthma Maternal Aunt   . Alpha-1 antitrypsin deficiency Maternal Aunt   .  Depression Maternal Aunt     Allergies: No Known Allergies  Medications Prior to Admission  Medication Sig Dispense Refill Last Dose  . diphenhydrAMINE (BENADRYL) 25 mg capsule Take 25 mg by mouth every 6 (six) hours as needed for itching.   07/04/2018 at Unknown time  . ferrous sulfate 325 (65 FE) MG tablet Take 325 mg by mouth daily with breakfast.   07/01/2018  . Pediatric Multiple Vit-C-FA (FLINSTONES GUMMIES OMEGA-3 DHA PO) Take by mouth. Takes 2 daily   07/01/2018 at Unknown time     Review of Systems  All systems reviewed and negative except as stated in HPI  Physical Exam Blood pressure 121/72, pulse 93, temperature 98 F (36.7 C), temperature source Oral, resp. rate 17, height 5\' 3"  (1.6 m), weight 232 lb (105.2 kg), last menstrual period 09/27/2017, SpO2 100 %, not currently breastfeeding. General appearance: alert, cooperative and mild distress, having to breath through contractions- requesting epidural  Lungs: clear to auscultation bilaterally Heart: regular rate and rhythm Abdomen: soft, non-tender; bowel  sounds normal Extremities: No calf swelling or tenderness Presentation: cephalic by cervical examination  Fetal monitoring: 125/ moderate variability/ +accels/ no decels  Uterine activity: 1-4 minutes/ mild by palpation  Dilation: 5 Effacement (%): 70, 80 Station: -2 Exam by:: Steward Drone, CNM  Prenatal labs: ABO, Rh: --/--/O POS (07/28 0314) Antibody: NEG (07/28 0314) Rubella: 0.93 (01/09 1617) RPR: Non Reactive (05/01 0850)  HBsAg: Negative (01/09 1617)  HIV: Non Reactive (05/01 0850)  GC/Chlamydia: negative (7/10) GBS: Negative (07/10 0000)  2 hr Glucola: [phone] (5/1) Genetic screening:  Negative  Anatomy US: normal female  [facility] Labs Results  Initiated care at 11wk Pap 12/31/17: neg  Dating by LMP c/w 1st trimester U/s 8wk GC/CT Initial:  -/-                  36wks:     -/-  Support Person [facility] NT/IT:   neg                AFP:                 NIPS:   Flu vaccine 06/22/18   CF: 02/14/16 neg          SMA:                Sickle Cell:3/9/17neg  Tdap vaccine 06/22/18  Blood type     O+              Rhogam:     Antibody    neg  Anatomy US Normal female   HIV  neg  Circumcision Yes at [facility]                                          RPR   neg  Feeding Preference breast HBsAg   neg  Pediatrician [place] Rubella  Equivocal  Contraception Paragard IUD 2hr GTT    Early:                   Repeat:  83/149/122     Prenatal Classes declined GBS  (For PCN allergy, check sensitivities)neg   Prenatal Transfer Tool  Maternal Diabetes: No Genetic Screening: Normal Maternal Ultrasounds/Referrals: Normal Fetal Ultrasounds or other Referrals:  None Maternal Substance Abuse:  No Significant Maternal Medications:  None Significant Maternal Lab Results: Lab values  include: Group B Strep negative  Results for orders placed or performed during the hospital encounter of 07/04/18 (from the past 24 hour(s))  POCT fern test   Collection Time: 07/04/18  2:15 AM   Result Value Ref Range   POCT Fern Test Positive = ruptured amniotic membanes   CBC   Collection Time: 07/04/18  3:14 AM  Result Value Ref Range   WBC 10.1 4.0 - 10.5 K/uL   RBC 4.26 3.87 - 5.11 MIL/uL   Hemoglobin 10.2 (L) 12.0 - 15.0 g/dL   HCT 16.1 (L) 09.6 - 04.5 %   MCV 75.1 (L) 78.0 - 100.0 fL   MCH 23.9 (L) 26.0 - 34.0 pg   MCHC 31.9 30.0 - 36.0 g/dL   RDW 40.9 (H) 81.1 - 91.4 %   Platelets 280 150 - 400 K/uL  Type and screen [facility]   Collection Time: 07/04/18  3:14 AM  Result Value Ref Range   ABO/RH(D) O POS    Antibody Screen NEG    Sample Expiration      07/07/2018 Performed at [facility], [address], [place], [place] [address]     Patient Active Problem List   Diagnosis Date Noted  . PROM (premature rupture of membranes) 07/04/2018  . Contraceptive management IUD paraguard PP 06/02/2018  . Rubella non-immune status, antepartum 12/18/2017  . Supervision of normal pregnancy 12/16/2017  . Ptyalism 12/16/2017  . Pilonidal cyst   . Chlamydia 02/20/2016    Assessment: Bailey Palmer is a 22 y.o. G2P1001 at [redacted]w[redacted]d here for PROM @0100   #Labor: Cervical change from 3cm to 5cm over 4 hours, expectant management at this time. Possible pitocin augmentation if uterine contractions space or no cervical change. #Pain: Planning epidural  #FWB: Cat I #ID:  GBS neg #MOF: Formula  #MOC:IUD- Paragard  #Circ:  Yes, outpatient at [facility]   Sharyon Cable, [place] 07/04/2018, 8:37 AM

## 2018-07-04 NOTE — MAU Note (Signed)
Pt. States she felt a gush of fluid at 1 am. Pt. States it has continued to leak. + FM. Denies N/V and bleeding.

## 2018-07-04 NOTE — Anesthesia Procedure Notes (Signed)
Epidural Patient location during procedure: OB Start time: 07/04/2018 8:50 AM End time: 07/04/2018 8:55 AM  Staffing Anesthesiologist: Leilani AbleHatchett, Arcadio Cope, MD Performed: anesthesiologist   Preanesthetic Checklist Completed: patient identified, site marked, surgical consent, pre-op evaluation, timeout performed, IV checked, risks and benefits discussed and monitors and equipment checked  Epidural Patient position: sitting Prep: site prepped and draped and DuraPrep Patient monitoring: continuous pulse ox and blood pressure Approach: midline Location: L3-L4 Injection technique: LOR air  Needle:  Needle type: Tuohy  Needle gauge: 17 G Needle length: 9 cm and 9 Needle insertion depth: 7 cm Catheter type: closed end flexible Catheter size: 19 Gauge Catheter at skin depth: 12 cm Test dose: negative and Other  Assessment Sensory level: T9 Events: blood not aspirated, injection not painful, no injection resistance, negative IV test and no paresthesia  Additional Notes Reason for block:procedure for pain

## 2018-07-04 NOTE — Anesthesia Postprocedure Evaluation (Signed)
Anesthesia Post Note  Patient: Bailey Palmer  Procedure(s) Performed: AN AD HOC LABOR EPIDURAL     Patient location during evaluation: Mother Baby Anesthesia Type: Epidural Level of consciousness: awake and alert Pain management: pain level controlled Vital Signs Assessment: post-procedure vital signs reviewed and stable Respiratory status: spontaneous breathing, nonlabored ventilation and respiratory function stable Cardiovascular status: stable Postop Assessment: no headache, no backache, epidural receding and patient able to bend at knees Anesthetic complications: no    Last Vitals:  Vitals:   07/04/18 1505 07/04/18 1600  BP: 108/69 119/76  Pulse: 97 100  Resp: 18 18  Temp: 36.7 C 36.8 C  SpO2:      Last Pain:  Vitals:   07/04/18 1832  TempSrc:   PainSc: 0-No pain   Pain Goal: Patients Stated Pain Goal: 3 (07/04/18 1620)               Rica RecordsICKELTON,Rhys Lichty

## 2018-07-04 NOTE — Anesthesia Pain Management Evaluation Note (Signed)
  CRNA Pain Management Visit Note  Patient: Bailey MinerDominique E Langhorne, 22 y.o., female  "Hello I am a member of the anesthesia team at [facility]. We have an anesthesia team available at all times to provide care throughout the hospital, including epidural management and anesthesia for C-section. I don't know your plan for the delivery whether it a natural birth, water birth, IV sedation, nitrous supplementation, doula or epidural, but we want to meet your pain goals."   1.Was your pain managed to your expectations on prior hospitalizations?  "No, I got it late, still felt contractions and pressure."   2.What is your expectation for pain management during this hospitalization?     Epidural  3.How can we help you reach that goal? **The patient was instructed about labor pain relief via epidural anesthesia,  I walked her through the process, including the proper position for her to assume, what she would feel (pressure) and what it should relieve (cramping pain, sharp pain).  Record the patient's initial score and the patient's pain goal.   Pain: 3  Pain Goal: 7 [facility] wants you to be able to say your pain was always managed very well.  Tecia Cinnamon 07/04/2018

## 2018-07-04 NOTE — Anesthesia Preprocedure Evaluation (Signed)
Anesthesia Evaluation  Patient identified by MRN, date of birth, ID band Patient awake    Reviewed: Allergy & Precautions, H&P , Patient's Chart, lab work & pertinent test results  Airway Mallampati: II  TM Distance: >3 FB Neck ROM: full    Dental no notable dental hx. (+) Teeth Intact   Pulmonary neg pulmonary ROS,    Pulmonary exam normal breath sounds clear to auscultation       Cardiovascular negative cardio ROS Normal cardiovascular exam Rhythm:regular Rate:Normal     Neuro/Psych negative neurological ROS  negative psych ROS   GI/Hepatic negative GI ROS, Neg liver ROS,   Endo/Other  Morbid obesity  Renal/GU negative Renal ROS     Musculoskeletal   Abdominal (+) + obese,   Peds  Hematology  (+) anemia ,   Anesthesia Other Findings   Reproductive/Obstetrics (+) Pregnancy                             Anesthesia Physical Anesthesia Plan  ASA: III  Anesthesia Plan: Epidural   Post-op Pain Management:    Induction:   PONV Risk Score and Plan:   Airway Management Planned:   Additional Equipment:   Intra-op Plan:   Post-operative Plan:   Informed Consent: I have reviewed the patients History and Physical, chart, labs and discussed the procedure including the risks, benefits and alternatives for the proposed anesthesia with the patient or authorized representative who has indicated his/her understanding and acceptance.     Plan Discussed with:   Anesthesia Plan Comments:         Anesthesia Quick Evaluation

## 2018-07-04 NOTE — MAU Note (Signed)
Had gush of fld about 0100. Clear fld. Cont to leak fld at intervals. Some ctxs that feel like tightening. Closed at last sve at 37wks

## 2018-07-05 MED ORDER — MEASLES, MUMPS & RUBELLA VAC ~~LOC~~ INJ
0.5000 mL | INJECTION | Freq: Once | SUBCUTANEOUS | Status: AC
  Administered 2018-07-06: 0.5 mL via SUBCUTANEOUS
  Filled 2018-07-05 (×2): qty 0.5

## 2018-07-05 NOTE — Progress Notes (Signed)
POSTPARTUM PROGRESS NOTE  Post Partum Day 1 Subjective:  Bailey Palmer is a 22 y.o. [id number] 98w0ds/p svd ppd1.  No acute events overnight.  Pt denies problems with ambulating, voiding or po intake.  She denies nausea or vomiting.  Pain is well controlled. Lochia Minimal. Pt endorses that bleeding is lighter than her normal menstruation cycle and watery. Pt endorses that she is  breastfeeding and has not had any issues with baby latching.   Objective: Blood pressure 119/70, pulse 74, temperature 97.9 F (36.6 C), temperature source Oral, resp. rate 18, height 5' 3" (1.6 m), weight 105.2 kg (232 lb), last menstrual period 09/27/2017, SpO2 94 %, unknown if currently breastfeeding.  Physical Exam:  General: alert, cooperative and no distress Lochia:normal flow Chest: no respiratory distress Heart:regular rate, distal pulses intact Abdomen: soft, nontender,  Uterine Fundus: firm, appropriately tender DVT Evaluation: no physical exam signs of DVT  Recent Labs    07/04/18 0314  HGB 10.2*  HCT 32.0*    Assessment/Plan:  ASSESSMENT: Bailey FURRis a 22y.o. [id number]/p svd ppd1 and is recovering well.   Plan for pt to Receive Rubella Vaccination (MMR)  Plan for discharge tomorrow, Breastfeeding, and supplementing with formula Lactation consult (pt endorses that she has not had any trouble with baby latching, but wouldn't mind a lactation nurse stopping by to provide additional insight) and Contraception: IUD to be placed at 6WScioostpartum visit   LOS: 1 day   Chika I Chukwu MS3 07/05/2018, 9:16 AM

## 2018-07-05 NOTE — Lactation Note (Signed)
This note was copied from a baby's chart. Lactation Consultation Note  Patient Name: Bailey Palmer ZOXWR'UToday's Date: 07/05/2018 Reason for consult: Initial assessment;Term  P2 mother whose infant is now 3923 hours old.  Mother has a 8321 month old who breastfed for 1 month.  Visitors in room when I arrived.  Baby is sleeping and not showing feeding cues.  Mother stated she is not sure he is latching well.  Advised ;mother to call RN/LC to observe next latch.  Mother willing to do so.  Reviewed feeding cues, STS, breast massage and hand expression.  Colostrum container and spoon provided for any EBM she obtains from hand expression.  Encouraged to feed 8-12 times/24 hours or sooner if baby shows cues.  Mom made aware of O/P services, breastfeeding support groups, community resources, and our phone # for post-discharge questions.    Maternal Data Formula Feeding for Exclusion: No Has patient been taught Hand Expression?: Yes Does the patient have breastfeeding experience prior to this delivery?: Yes  Feeding    LATCH Score                   Interventions    Lactation Tools Discussed/Used WIC Program: Yes   Consult Status Consult Status: Follow-up Date: 07/06/18 Follow-up type: In-patient    Bailey Palmer [name] 07/05/2018, 12:20 PM

## 2018-07-06 MED ORDER — IBUPROFEN 600 MG PO TABS: 600.0000 mg | ORAL_TABLET | Freq: Four times a day (QID) | ORAL | 0 refills | Status: DC

## 2018-07-06 NOTE — Progress Notes (Signed)
Pt agrees to be interviewed by [facility]. 

## 2018-07-06 NOTE — Progress Notes (Signed)
Parent request formula to supplement breast feeding due to supplementation for cluster feeding. Parents have been informed of small tummy size of newborn, taught hand expression and understand the possible consequences of formula to the health of the infant. The possible consequences shared with patient include 1) Loss of confidence in breastfeeding 2) Engorgement 3) Allergic sensitization of baby(asthma/allergies) and 4) decreased milk supply for mother.After discussion of the above the mother decided to supplement. The tool used to give formula supplement will be given and educated.  Mother counseled to avoid artificial nipples because this practice may lead to latch difficulties,inadequate milk transfer and nipple soreness.

## 2018-07-06 NOTE — Lactation Note (Signed)
This note was copied from a baby's chart. Lactation Consultation Note:  Mother has bilateral positional strips. Comfort gels given. Mother has a harmony hand pump.  She is active with WIC. She doesn't have an electric pump .   Assist mother with latching infant on in football hold. Infant sustained latch for 15 mins.  Observed good milk transfer. Mother's milk spray's when hand expressed. Infant released the breast .  Observed slight pinching  Mother encouraged to rouse infant and feed from alternate breast.  Mother reports that left nipple is sore and she wants to leave comfort gel in place at this time.   Advised mother to feed infant at least 8-12 times in 24 hours and with feeding cues.  Discussed cluster feeding.  Mother advised to post pump with hand pump for 15 mins on each breast.   Infant is jaundice. Discussed using an electric pump to post pump and supplement infant with ebm.  Staff nurse to sat up DEBP if needed.  Discussed treatment and prevention of engorgement.  Mother is aware of available LC services, BFSG"S, outpatient dept. and 24/7 phone services as well as community support.    Patient Name: Bailey Palmer ZOXWR'UToday's Date: 07/06/2018 Reason for consult: Follow-up assessment   Maternal Data    Feeding Feeding Type: Breast Fed Length of feed: 15 min  LATCH Score Latch: Grasps breast easily, tongue down, lips flanged, rhythmical sucking.  Audible Swallowing: Spontaneous and intermittent  Type of Nipple: Everted at rest and after stimulation  Comfort (Breast/Nipple): Filling, red/small blisters or bruises, mild/mod discomfort  Hold (Positioning): Assistance needed to correctly position infant at breast and maintain latch.  LATCH Score: 8  Interventions Interventions: Assisted with latch;Skin to skin;Breast massage;Hand express;Breast compression;Adjust position;Support pillows;Position options;Expressed milk;Comfort gels;Hand pump  Lactation Tools  Discussed/Used     Consult Status Consult Status: Follow-up Date: 07/06/18 Follow-up type: In-patient    [name], [name] 07/06/2018, 12:27 PM

## 2018-07-06 NOTE — Discharge Summary (Signed)
OB Discharge Summary     Patient Name: Bailey Palmer DOB: [birth year] MRN: [id number]  Date of admission: 07/04/2018 Delivering MD: Koren Shiver D   Date of discharge: 07/06/2018  Admitting diagnosis: 77 wks water broke and having ctx Intrauterine pregnancy: [redacted]w[redacted]d    Secondary diagnosis:  Principal Problem:   PROM (premature rupture of membranes) Active Problems:   Rubella non-immune status, antepartum   SVD (spontaneous vaginal delivery)    Discharge diagnosis: Term Pregnancy Delivered                                                                                                Post partum procedures:MMR  Augmentation: Pitocin  Complications: None  Hospital course:  Induction of Labor With Vaginal Delivery   22y.o. yo [id number] admitted to the hospital 07/04/2018 for induction of labor.  Indication for induction: PROM.  Patient had an uncomplicated labor course as follows: Membrane Rupture Time/Date: 1:00 AM ,07/04/2018   Intrapartum Procedures: Episiotomy: None [1]                                         Lacerations:  None [1]  Patient had delivery of a Viable infant.  Information for the patient's newborn:  AbPeola, Joynt0[[id number]]Delivery Method: Vag-Spont   07/04/2018  Details of delivery can be found in separate delivery note.  Patient had a routine postpartum course. Patient is discharged home 07/06/18.  Physical exam  Vitals:   07/05/18 0543 07/05/18 1342 07/06/18 0041 07/06/18 0603  BP: 119/70 121/81 110/79 134/74  Pulse: 74 88 70 73  Resp: _0 Temp: 97.9 F (36.6 C) 98 F (36.7 C) 97.9 F (36.6 C) 98.3 F (36.8 C)  TempSrc: Oral Oral Oral Oral  SpO2:  100%  100%  Weight:      Height:       General: alert, cooperative and no distress Lochia: appropriate Uterine Fundus: firm Incision: N/A DVT Evaluation: No evidence of DVT seen on physical exam. Labs: Lab Results  Component Value Date   WBC 10.1 07/04/2018   HGB  10.2 (L) 07/04/2018   HCT 32.0 (L) 07/04/2018   MCV 75.1 (L) 07/04/2018   PLT 280 07/04/2018   CMP Latest Ref Rng & Units 04/21/2017  Glucose 65 - 99 mg/dL 98  BUN 6 - 20 mg/dL 11  Creatinine 0.44 - 1.00 mg/dL 0.41(L)  Sodium 135 - 145 mmol/L 137  Potassium 3.5 - 5.1 mmol/L 3.8  Chloride 101 - 111 mmol/L 106  CO2 22 - 32 mmol/L 26  Calcium 8.9 - 10.3 mg/dL 8.9  Total Protein 6.5 - 8.1 g/dL -  Total Bilirubin 0.3 - 1.2 mg/dL -  Alkaline Phos 38 - 126 U/L -  AST 15 - 41 U/L -  ALT 14 - 54 U/L -    Discharge instruction: per After Visit Summary and "Baby and Me Booklet".  After visit meds:  Allergies as of 07/06/2018  No Known Allergies     Medication List    TAKE these medications   diphenhydrAMINE 25 mg capsule Commonly known as:  BENADRYL Take 25 mg by mouth every 6 (six) hours as needed for itching.   ferrous sulfate 325 (65 FE) MG tablet Take 325 mg by mouth daily with breakfast.   FLINSTONES GUMMIES OMEGA-3 DHA PO Take by mouth. Takes 2 daily   ibuprofen 600 MG tablet Commonly known as:  ADVIL,MOTRIN Take 1 tablet (600 mg total) by mouth every 6 (six) hours.       Diet: routine diet  Activity: Advance as tolerated. Pelvic rest for 6 weeks.   Outpatient follow up:4 weeks Follow up Appt: Future Appointments  Date Time Provider [place]  08/10/2018 11:15 AM Roma Schanz, CNM [facility]    Postpartum contraception: IUD Paragard  Newborn Data: Live born female  Birth Weight: 9 lb 1.7 oz (4130 g) APGAR: 7, 9  Newborn Delivery   Birth date/time:  07/04/2018 12:59:00 Delivery type:  Vaginal, Spontaneous     Baby Feeding: Bottle Disposition:home with mother   07/06/2018 Gailen Shelter, MD

## 2018-07-09 ENCOUNTER — Encounter: Payer: Medicaid Other | Admitting: Obstetrics & Gynecology

## 2018-07-09 ENCOUNTER — Other Ambulatory Visit: Payer: Medicaid Other

## 2018-08-10 ENCOUNTER — Encounter: Payer: Self-pay | Admitting: Women's Health

## 2018-08-10 ENCOUNTER — Ambulatory Visit (INDEPENDENT_AMBULATORY_CARE_PROVIDER_SITE_OTHER): Payer: Medicaid Other | Admitting: Women's Health

## 2018-08-10 DIAGNOSIS — K602 Anal fissure, unspecified: Secondary | ICD-10-CM | POA: Diagnosis not present

## 2018-08-10 MED ORDER — LIDOCAINE HCL URETHRAL/MUCOSAL 2 % EX GEL: CUTANEOUS | 1 refills | Status: DC

## 2018-08-10 NOTE — Patient Instructions (Addendum)
NO SEX UNTIL AFTER YOU GET YOUR BIRTH CONTROL   Tips To Increase Milk Supply  Lots of water! Enough so that your urine is clear  Plenty of calories, if you're not getting enough calories, your milk supply can decrease  Breastfeed/pump often, every 2-3 hours x 20-30mins  Fenugreek 3 pills 3 times a day, this may make your urine smell like maple syrup  Mother's Milk Tea  Lactation cookies, google for the recipe  Real oatmeal    Constipation  Drink plenty of fluid, preferably water, throughout the day  Eat foods high in fiber such as fruits, vegetables, and grains  Exercise, such as walking, is a good way to keep your bowels regular  Drink warm fluids, especially warm prune juice, or decaf coffee  Eat a 1/2 cup of real oatmeal (not instant), 1/2 cup applesauce, and 1/2-1 cup warm prune juice every day  If needed, you may take Colace (docusate sodium) stool softener once or twice a day to help keep the stool soft. If you are pregnant, wait until you are out of your first trimester (12-14 weeks of pregnancy)  If you still are having problems with constipation, you may take Miralax once daily as needed to help keep your bowels regular.  If you are pregnant, wait until you are out of your first trimester (12-14 weeks of pregnancy)    

## 2018-08-10 NOTE — Progress Notes (Signed)
   POSTPARTUM VISIT Patient name: Bailey Palmer MRN [id number]  Date of birth: 06-Dec-1996 Chief Complaint:   Postpartum Care  History of Present Illness:   Bailey Palmer is a 22 y.o. G22P2002 Hispanic female being seen today for a postpartum visit. She is 4 weeks postpartum following a spontaneous vaginal delivery at 40.0 gestational weeks. Anesthesia: epidural. I have fully reviewed the prenatal and intrapartum course. Pregnancy uncomplicated. Postpartum course has been uncomplicated. Bleeding no bleeding. Bowel function is constipation, w/ pain and bleeding w/ bm's. Bladder function is normal.  Patient is not sexually active. Last sexual activity: prior to birth of baby.  Contraception method is wants Paragard.  [place] Postpartum Depression Screening: negative. Score 0.   Last pap 12/31/17.  Results were normal .  No LMP recorded. (Menstrual status: Lactating).  Baby's course has been uncomplicated. Baby is feeding by breast & bottle.  Review of Systems:   Pertinent items are noted in HPI Denies Abnormal vaginal discharge w/ itching/odor/irritation, headaches, visual changes, shortness of breath, chest pain, abdominal pain, severe nausea/vomiting, or problems with urination or bowel movements. Pertinent History Reviewed:  Reviewed past medical,surgical, obstetrical and family history.  Reviewed problem list, medications and allergies. OB History  Gravida Para Term Preterm AB Living  2 2 2     2   SAB TAB Ectopic Multiple Live Births        0 2    # Outcome Date GA Lbr Len/2nd Weight Sex Delivery Anes PTL Lv  2 Term 07/04/18 [redacted]w[redacted]d 11:36 / 00:23 9 lb 1.7 oz (4.13 kg) M Vag-Spont EPI  LIV  1 Term 09/17/16 [redacted]w[redacted]d 13:25 / 00:33 8 lb 2 oz (3.685 kg) F Vag-Spont EPI N LIV   Physical Assessment:   Vitals:   08/10/18 1119  BP: 116/75  Pulse: 70  Weight: 212 lb (96.2 kg)  Height: 5\' 3"  (1.6 m)  Body mass index is 37.55 kg/m.       Physical Examination:   General appearance:  alert, well appearing, and in no distress  Mental status: alert, oriented to person, place, and time  Skin: warm & dry   Cardiovascular: normal heart rate noted   Respiratory: normal respiratory effort, no distress   Breasts: deferred, no complaints   Abdomen: soft, non-tender   Pelvic: VULVA: normal appearing vulva with no masses, tenderness or lesions, UTERUS: uterus is normal size, shape, consistency and nontender  Rectal: small external non-thrombosed hemorrhoid, internal: pain on posterior portion of rectum w/ possible fissure  Extremities: no edema       No results found for this or any previous visit (from the past 24 hour(s)).  Assessment & Plan:  1) Postpartum exam 2) 4 wks s/p SVB 3) Breast & bottlefeeding 4) Depression screening 5) Contraception counseling, pt prefers abstinence until Paragard  6) Anal fissure w/ constipation> gave printed info, rx lidocaine jelly  Meds:  Meds ordered this encounter  Medications  . lidocaine (XYLOCAINE) 2 % jelly    Sig: Apply to/slightly inside rectum 10 minutes before having a bowel movement    Dispense:  5 mL    Refill:  1    Order Specific Question:   Supervising Provider    Answer:   Lazaro Arms [[id number]]    Follow-up: Return for 1st available for IUD insertion.   No orders of the defined types were placed in this encounter.   Cheral Marker CNM, WHNP-BC 08/10/2018 12:00 PM

## 2018-08-26 ENCOUNTER — Ambulatory Visit (INDEPENDENT_AMBULATORY_CARE_PROVIDER_SITE_OTHER): Payer: Medicaid Other | Admitting: Women's Health

## 2018-08-26 ENCOUNTER — Encounter: Payer: Self-pay | Admitting: Women's Health

## 2018-08-26 VITALS — BP 116/66 | HR 73 | Ht 63.0 in | Wt 214.0 lb

## 2018-08-26 DIAGNOSIS — Z3202 Encounter for pregnancy test, result negative: Secondary | ICD-10-CM

## 2018-08-26 DIAGNOSIS — Z30430 Encounter for insertion of intrauterine contraceptive device: Secondary | ICD-10-CM

## 2018-08-26 LAB — POCT URINE PREGNANCY: Preg Test, Ur: NEGATIVE

## 2018-08-26 MED ORDER — PARAGARD INTRAUTERINE COPPER IU IUD
INTRAUTERINE_SYSTEM | Freq: Once | INTRAUTERINE | Status: AC
  Administered 2018-08-26: 1 via INTRAUTERINE

## 2018-08-26 NOTE — Patient Instructions (Signed)
 Nothing in vagina for 3 days (no sex, douching, tampons, etc...)  Check your strings once a month to make sure you can feel them, if you are not able to please let us know  If you develop a fever of 100.4 or more in the next few weeks, or if you develop severe abdominal pain, please let us know  Use a backup method of birth control, such as condoms, for 2 weeks    Intrauterine Device Insertion, Care After This sheet gives you information about how to care for yourself after your procedure. Your health care provider may also give you more specific instructions. If you have problems or questions, contact your health care provider. What can I expect after the procedure? After the procedure, it is common to have:  Cramps and pain in the abdomen.  Light bleeding (spotting) or heavier bleeding that is like your menstrual period. This may last for up to a few days.  Lower back pain.  Dizziness.  Headaches.  Nausea.  Follow these instructions at home:  Before resuming sexual activity, check to make sure that you can feel the IUD string(s). You should be able to feel the end of the string(s) below the opening of your cervix. If your IUD string is in place, you may resume sexual activity. ? If you had a hormonal IUD inserted more than 7 days after your most recent period started, you will need to use a backup method of birth control for 7 days after IUD insertion. Ask your health care provider whether this applies to you.  Continue to check that the IUD is still in place by feeling for the string(s) after every menstrual period, or once a month.  Take over-the-counter and prescription medicines only as told by your health care provider.  Do not drive or use heavy machinery while taking prescription pain medicine.  Keep all follow-up visits as told by your health care provider. This is important. Contact a health care provider if:  You have bleeding that is heavier or lasts longer than  a normal menstrual cycle.  You have a fever.  You have cramps or abdominal pain that get worse or do not get better with medicine.  You develop abdominal pain that is new or is not in the same area of earlier cramping and pain.  You feel lightheaded or weak.  You have abnormal or bad-smelling discharge from your vagina.  You have pain during sexual activity.  You have any of the following problems with your IUD string(s): ? The string bothers or hurts you or your sexual partner. ? You cannot feel the string. ? The string has gotten longer.  You can feel the IUD in your vagina.  You think you may be pregnant, or you miss your menstrual period.  You think you may have an STI (sexually transmitted infection). Get help right away if:  You have flu-like symptoms.  You have a fever and chills.  You can feel that your IUD has slipped out of place. Summary  After the procedure, it is common to have cramps and pain in the abdomen. It is also common to have light bleeding (spotting) or heavier bleeding that is like your menstrual period.  Continue to check that the IUD is still in place by feeling for the string(s) after every menstrual period, or once a month.  Keep all follow-up visits as told by your health care provider. This is important.  Contact your health care provider if   you have problems with your IUD string(s), such as the string getting longer or bothering you or your sexual partner. This information is not intended to replace advice given to you by your health care provider. Make sure you discuss any questions you have with your health care provider. Document Released: 07/23/2011 Document Revised: 10/15/2016 Document Reviewed: 10/15/2016 Elsevier Interactive Patient Education  2017 [employer]  

## 2018-08-26 NOTE — Progress Notes (Signed)
   IUD INSERTION Patient name: Bailey Palmer MRN [id number]  Date of birth: July 16, 1996 Subjective Findings:   Bailey Palmer is a 22 y.o. [phone] female being seen today for insertion of a Paragard IUD.   Patient's last menstrual period was 08/25/2018. Last sexual intercourse was prior to birth of baby Last pap1/24/19. Results were:  normal  The risks and benefits of the method and placement have been thouroughly reviewed with the patient and all questions were answered.  Specifically the patient is aware of failure rate of 12/998, expulsion of the IUD and of possible perforation.  The patient is aware of irregular bleeding due to the method and understands the incidence of irregular bleeding diminishes with time.  Signed copy of informed consent in chart.  Pertinent History Reviewed:   Reviewed past medical,surgical, social, obstetrical and family history.  Reviewed problem list, medications and allergies. Objective Findings & Procedure:   Vitals:   08/26/18 1545  BP: 116/66  Pulse: 73  Weight: 214 lb (97.1 kg)  Height: 5\' 3"  (1.6 m)  Body mass index is 37.91 kg/m.  Results for orders placed or performed in visit on 08/26/18 (from the past 24 hour(s))  POCT urine pregnancy   Collection Time: 08/26/18  3:45 PM  Result Value Ref Range   Preg Test, Ur Negative Negative     Time out was performed.  A graves speculum was placed in the vagina.  The cervix was visualized, prepped using Betadine, and grasped with a single tooth tenaculum. The uterus was found to be neutral and it sounded to 8 cm.  Paragard IUD placed per manufacturer's recommendations. The strings were trimmed to approximately 3 cm. The patient tolerated the procedure well.   Informal transvaginal sonogram was performed and the proper placement of the IUD was verified. Assessment & Plan:   1) Paragard IUD insertion The patient was given post procedure instructions, including signs and symptoms of infection and to  check for the strings after each menses or each month, and refraining from intercourse or anything in the vagina for 3 days. She was given a Paragard care card with date IUD placed, and date IUD to be removed. She is scheduled for a f/u appointment in 4 weeks.  Orders Placed This Encounter  Procedures  . POCT urine pregnancy    Return in about 4 weeks (around 09/23/2018) for F/U.  Cheral MarkerKimberly R Elohim Brune CNM, [facility] 08/26/2018 4:13 PM

## 2018-09-17 ENCOUNTER — Ambulatory Visit: Payer: Self-pay | Admitting: Women's Health

## 2018-10-15 ENCOUNTER — Encounter: Payer: Self-pay | Admitting: Women's Health

## 2018-10-15 ENCOUNTER — Ambulatory Visit (INDEPENDENT_AMBULATORY_CARE_PROVIDER_SITE_OTHER): Payer: Medicaid Other | Admitting: Women's Health

## 2018-10-15 ENCOUNTER — Other Ambulatory Visit: Payer: Self-pay

## 2018-10-15 VITALS — BP 113/64 | HR 90 | Ht 63.0 in | Wt 217.0 lb

## 2018-10-15 DIAGNOSIS — N760 Acute vaginitis: Secondary | ICD-10-CM | POA: Diagnosis not present

## 2018-10-15 DIAGNOSIS — N898 Other specified noninflammatory disorders of vagina: Secondary | ICD-10-CM | POA: Diagnosis not present

## 2018-10-15 DIAGNOSIS — B9689 Other specified bacterial agents as the cause of diseases classified elsewhere: Secondary | ICD-10-CM | POA: Diagnosis not present

## 2018-10-15 DIAGNOSIS — Z30431 Encounter for routine checking of intrauterine contraceptive device: Secondary | ICD-10-CM

## 2018-10-15 DIAGNOSIS — Z113 Encounter for screening for infections with a predominantly sexual mode of transmission: Secondary | ICD-10-CM | POA: Diagnosis not present

## 2018-10-15 LAB — POCT WET PREP (WET MOUNT)
CLUE CELLS WET PREP WHIFF POC: POSITIVE
TRICHOMONAS WET PREP HPF POC: ABSENT

## 2018-10-15 MED ORDER — METRONIDAZOLE 500 MG PO TABS: 500.0000 mg | ORAL_TABLET | Freq: Two times a day (BID) | ORAL | 0 refills | Status: DC

## 2018-10-15 NOTE — Progress Notes (Addendum)
   GYN VISIT Patient name: Bailey Palmer MRN [id number]  Date of birth: 03-13-1996 Chief Complaint:   IUD check  History of Present Illness:   Bailey Palmer is a 22 y.o. [phone] female being seen today for IUD check. Paragard IUD placed 08/26/18.  Not sure what she's feeling when she tries to check for strings. Partner says he has felt them. No pain w/ sex. Some irregular bleeding. Some discharge w/ odor. No itching/irritation.  No LMP recorded. (Menstrual status: IUD). The current method of family planning is IUD. Last pap 12/31/17. Results were:  normal Review of Systems:   Pertinent items are noted in HPI Denies fever/chills, dizziness, headaches, visual disturbances, fatigue, shortness of breath, chest pain, abdominal pain, vomiting, abnormal vaginal discharge/itching/odor/irritation, problems with periods, bowel movements, urination, or intercourse unless otherwise stated above.  Pertinent History Reviewed:  Reviewed past medical,surgical, social, obstetrical and family history.  Reviewed problem list, medications and allergies. Physical Assessment:   Vitals:   10/15/18 1237  BP: 113/64  Pulse: 90  Weight: 217 lb (98.4 kg)  Height: 5\' 3"  (1.6 m)  Body mass index is 38.44 kg/m.       Physical Examination:   General appearance: alert, well appearing, and in no distress  Mental status: alert, oriented to person, place, and time  Skin: warm & dry   Cardiovascular: normal heart rate noted  Respiratory: normal respiratory effort, no distress  Abdomen: soft, non-tender   Pelvic: VULVA: normal appearing vulva with no masses, tenderness or lesions, VAGINA: normal appearing vagina with normal color and mod amt thin watery white malodorous discharge, no lesions, CERVIX: normal appearing cervix without discharge or lesions, IUD strings visible, appropriate length  Extremities: no edema   Results for orders placed or performed in visit on 10/15/18 (from the past 24 hour(s))  POCT  Wet Prep [facility])   Collection Time: 10/15/18  1:02 PM  Result Value Ref Range   Source Wet Prep POC vaginal    WBC, Wet Prep HPF POC mod    Bacteria Wet Prep HPF POC Few Few   BACTERIA WET PREP MORPHOLOGY POC     Clue Cells Wet Prep HPF POC Many (A) None   Clue Cells Wet Prep Whiff POC Positive Whiff    Yeast Wet Prep HPF POC None None   KOH Wet Prep POC     Trichomonas Wet Prep HPF POC Absent Absent    Assessment & Plan:  1) Paragard IUD check> in place  2) BV> Rx metronidazole 500mg  BID x 7d for BV, no sex or etoh while taking, send gc/ct  Meds:  Meds ordered this encounter  Medications  . metroNIDAZOLE (FLAGYL) 500 MG tablet    Sig: Take 1 tablet (500 mg total) by mouth 2 (two) times daily.    Dispense:  14 tablet    Refill:  0    Order Specific Question:   Supervising Provider    Answer:   Duane Lope H [[id number]]    Orders Placed This Encounter  Procedures  . GC/Chlamydia Probe Amp  . POCT Wet Prep [facility])    Return in about 1 year (around 10/16/2019) for Physical.  Cheral Marker CNM, [facility] 10/15/2018 1:03 PM

## 2018-10-17 LAB — GC/CHLAMYDIA PROBE AMP
CHLAMYDIA, DNA PROBE: NEGATIVE
NEISSERIA GONORRHOEAE BY PCR: NEGATIVE

## 2018-11-18 ENCOUNTER — Encounter (HOSPITAL_COMMUNITY): Payer: Self-pay | Admitting: Emergency Medicine

## 2018-11-18 ENCOUNTER — Other Ambulatory Visit: Payer: Self-pay

## 2018-11-18 ENCOUNTER — Emergency Department (HOSPITAL_COMMUNITY)
Admission: EM | Admit: 2018-11-18 | Discharge: 2018-11-18 | Disposition: A | Discharge status: Home / Self Care | Payer: Medicaid Other | Attending: Emergency Medicine | Admitting: Emergency Medicine

## 2018-11-18 DIAGNOSIS — J029 Acute pharyngitis, unspecified: Secondary | ICD-10-CM | POA: Insufficient documentation

## 2018-11-18 DIAGNOSIS — Z79899 Other long term (current) drug therapy: Secondary | ICD-10-CM | POA: Insufficient documentation

## 2018-11-18 MED ORDER — KETOROLAC TROMETHAMINE 60 MG/2ML IM SOLN
60.0000 mg | Freq: Once | INTRAMUSCULAR | Status: AC
  Administered 2018-11-18: 60 mg via INTRAMUSCULAR
  Filled 2018-11-18: qty 2

## 2018-11-18 MED ORDER — IBUPROFEN 400 MG PO TABS: 400.0000 mg | ORAL_TABLET | Freq: Four times a day (QID) | ORAL | 0 refills | Status: DC | PRN

## 2018-11-18 MED ORDER — DEXAMETHASONE 10 MG/ML FOR PEDIATRIC ORAL USE
16.0000 mg | Freq: Once | INTRAMUSCULAR | Status: AC
  Administered 2018-11-18: 16 mg via ORAL
  Filled 2018-11-18: qty 2

## 2018-11-18 MED ORDER — PENICILLIN G BENZATHINE 1200000 UNIT/2ML IM SUSP
1.2000 10*6.[IU] | Freq: Once | INTRAMUSCULAR | Status: AC
  Administered 2018-11-18: 1.2 10*6.[IU] via INTRAMUSCULAR
  Filled 2018-11-18: qty 2

## 2018-11-18 NOTE — ED Triage Notes (Signed)
Pt c/o sore throat since Saturday. Pt states she has white spots on her tonsils. Pt states shes been having fever for 2 days and taking tyenol.

## 2018-11-18 NOTE — ED Provider Notes (Signed)
Emergency Department Provider Note   I have reviewed the triage vital signs and the nursing notes.   HISTORY  Chief Complaint Sore Throat   HPI Bailey Palmer is a 22 y.o. female who presents the emergency department today with a couple days of worsening sore throat, exudates and fever.  No trouble swallowing.  No trouble breathing.  Has had this 1 time before.  Has not taken thing for the symptoms at home.  No wheezing.  No productive cough.No other associated or modifying symptoms.    Past Medical History:  Diagnosis Date  . Anemia    IDA  . Chronic abdominal pain   . Cyst near tailbone   . DUB (dysfunctional uterine bleeding)   . Heart murmur     Patient Active Problem List   Diagnosis Date Noted  . Encounter for insertion of ParaGard IUD 08/26/2018  . Ptyalism 12/16/2017  . Pilonidal cyst   . Chlamydia 02/20/2016    Past Surgical History:  Procedure Laterality Date  . APPENDECTOMY    . CHOLECYSTECTOMY    . MASS EXCISION N/A 04/24/2017   Procedure: EXCISION PILONDIAL CYST;  Surgeon: Franky Macho, MD;  Location: [facility];  Service: General;  Laterality: N/A;    Current Outpatient Rx  . Order #: [id number] Class: Historical Med  . Order #: [id number] Class: Historical Med  . Order #: [id number] Class: Normal  . Order #: [id number] Class: Normal  . Order #: [id number] Class: Historical Med    Allergies Patient has no known allergies.  Family History  Problem Relation Age of Onset  . Asthma Mother   . Miscarriages / [place] Mother   . Varicose Veins Mother   . Alpha-1 antitrypsin deficiency Mother        carrier  . ADD / ADHD Brother   . Diabetes Maternal Grandmother   . Hypertension Maternal Grandmother   . Heart disease Maternal Grandmother   . Asthma Maternal Aunt   . Alpha-1 antitrypsin deficiency Maternal Aunt   . Depression Maternal Aunt     Social History Social History   Tobacco Use  . Smoking status: Never Smoker  . Smokeless tobacco:  Never Used  Substance Use Topics  . Alcohol use: No    Alcohol/week: 0.0 standard drinks  . Drug use: No    Review of Systems  All other systems negative except as documented in the HPI. All pertinent positives and negatives as reviewed in the HPI. ____________________________________________   PHYSICAL EXAM:  VITAL SIGNS: ED Triage Vitals [11/18/18 0311]  Enc Vitals Group     BP 121/90     Pulse Rate (!) 113     Resp 19     Temp 100.2 F (37.9 C)     Temp Source Oral     SpO2 99 %     Weight 210 lb (95.3 kg)     Height 5\' 2"  (1.575 m)    Constitutional: Alert and oriented. Well appearing and in no acute distress. Eyes: Conjunctivae are normal. PERRL. EOMI. Head: Atraumatic. Nose: No congestion/rhinnorhea. Mouth/Throat: Mucous membranes are moist.  Oropharynx with eryrthema. Tonsillar exudates. No asymmetric swelling. Normal voice Neck: No stridor.  No meningeal signs.  lymphadenopathy Cardiovascular: tachycardic rate, regular rhythm. Good peripheral circulation. Grossly normal heart sounds.   Respiratory: Normal respiratory effort.  No retractions. Lungs CTAB. Gastrointestinal: Soft and nontender. No distention.  Musculoskeletal: No lower extremity tenderness nor edema. No gross deformities of extremities. Neurologic:  Normal speech and language. No gross focal neurologic deficits  are appreciated.  Skin:  Skin is warm, dry and intact. No rash noted.   ____________________________________________   INITIAL IMPRESSION / ASSESSMENT AND PLAN / ED COURSE  Centor score of 4. Likely strep without obvious complications. Will treat w/ PCN and for symptoms.  Pertinent labs & imaging results that were available during my care of the patient were reviewed by me and considered in my medical decision making (see chart for details).  ____________________________________________  FINAL CLINICAL IMPRESSION(S) / ED DIAGNOSES  Final diagnoses:  Pharyngitis, unspecified  etiology     MEDICATIONS GIVEN DURING THIS VISIT:  Medications  penicillin g benzathine (BICILLIN LA) 1200000 UNIT/2ML injection 1.2 Million Units (1.2 Million Units Intramuscular Given 11/18/18 0331)  dexamethasone (DECADRON) 10 MG/ML injection for Pediatric ORAL use 16 mg (16 mg Oral Given 11/18/18 0332)  ketorolac (TORADOL) injection 60 mg (60 mg Intramuscular Given 11/18/18 0332)     NEW OUTPATIENT MEDICATIONS STARTED DURING THIS VISIT:  New Prescriptions   IBUPROFEN (ADVIL,MOTRIN) 400 MG TABLET    Take 1 tablet (400 mg total) by mouth every 6 (six) hours as needed.    Note:  This note was prepared with assistance of Dragon voice recognition software. Occasional wrong-word or sound-a-like substitutions may have occurred due to the inherent limitations of voice recognition software.   Jalecia Leon, Barbara CowerJason, MD 11/18/18 [phone]

## 2018-11-24 ENCOUNTER — Ambulatory Visit (INDEPENDENT_AMBULATORY_CARE_PROVIDER_SITE_OTHER): Payer: Self-pay | Admitting: Obstetrics and Gynecology

## 2018-11-24 ENCOUNTER — Encounter: Payer: Self-pay | Admitting: Obstetrics and Gynecology

## 2018-11-24 VITALS — BP 122/81 | HR 88 | Ht 62.0 in | Wt 214.0 lb

## 2018-11-24 DIAGNOSIS — Z113 Encounter for screening for infections with a predominantly sexual mode of transmission: Secondary | ICD-10-CM

## 2018-11-24 DIAGNOSIS — N898 Other specified noninflammatory disorders of vagina: Secondary | ICD-10-CM

## 2018-11-24 LAB — POCT WET PREP WITH KOH
Clue Cells Wet Prep HPF POC: NEGATIVE
KOH PREP POC: POSITIVE — AB
Trichomonas, UA: NEGATIVE

## 2018-11-24 MED ORDER — METRONIDAZOLE 0.75 % VA GEL: 1.0000 | Freq: Every day | VAGINAL | 1 refills | Status: DC

## 2018-11-24 NOTE — Addendum Note (Signed)
Addended by: Colen DarlingYOUNG, Malakye Nolden S on: 11/24/2018 04:28 PM   Modules accepted: Orders

## 2018-11-24 NOTE — Progress Notes (Signed)
Patient ID: Bailey Palmer, female   DOB: 1996/05/16, 22 y.o.   MRN: [id number]    [facility] Clinic Visit  @DATE @            Patient name: Bailey Palmer MRN [id number]  Date of birth: 1996-07-23  CC & HPI:  Bailey Palmer is a 22 y.o. female presenting today for vaginal itching. Says it itches more in clitoral area. Has been going on for 3 weeks. Boyfriend was seeing someone else and wants to get tested to be sure. Per patient had BV a few weeks ago.   As copper IUD in place, last month period was normal, thought her period was beginning due to some bleeding this morning but has has not bled anymore since.  ROS:  ROS +vaginal itching +vaginal odor -fever -chills All systems are negative except as noted in the HPI and PMH.   Pertinent History Reviewed:   Reviewed:  Medical         Past Medical History:  Diagnosis Date  . Anemia    IDA  . Chronic abdominal pain   . Cyst near tailbone   . DUB (dysfunctional uterine bleeding)   . Heart murmur                               Surgical Hx:    Past Surgical History:  Procedure Laterality Date  . APPENDECTOMY    . CHOLECYSTECTOMY    . MASS EXCISION N/A 04/24/2017   Procedure: EXCISION PILONDIAL CYST;  Surgeon: Franky Macho, MD;  Location: [facility];  Service: General;  Laterality: N/A;   Medications: Reviewed & Updated - see associated section                       Current Outpatient Medications:  .  diphenhydrAMINE (BENADRYL) 25 mg capsule, Take 25 mg by mouth every 6 (six) hours as needed for itching., Disp: , Rfl:  .  ferrous sulfate 325 (65 FE) MG tablet, Take 325 mg by mouth daily with breakfast., Disp: , Rfl:  .  ibuprofen (ADVIL,MOTRIN) 400 MG tablet, Take 1 tablet (400 mg total) by mouth every 6 (six) hours as needed. (Patient not taking: Reported on 11/24/2018), Disp: 30 tablet, Rfl: 0 .  metroNIDAZOLE (FLAGYL) 500 MG tablet, Take 1 tablet (500 mg total) by mouth 2 (two) times daily. (Patient not taking: Reported on  11/24/2018), Disp: 14 tablet, Rfl: 0 .  Pediatric Multiple Vit-C-FA (FLINSTONES GUMMIES OMEGA-3 DHA PO), Take by mouth. Takes 2 daily, Disp: , Rfl:    Social History: Reviewed -  reports that she has never smoked. She has never used smokeless tobacco.  Objective Findings:  Vitals: Blood pressure 122/81, pulse 88, height 5\' 2"  (1.575 m), weight 214 lb (97.1 kg), last menstrual period 11/24/2018, currently breastfeeding.  PHYSICAL EXAMINATION General appearance - alert, well appearing, and in no distress and oriented to person, place, and time Mental status - alert, oriented to person, place, and time, normal mood, behavior, speech, dress, motor activity, and thought processes, affect appropriate to mood   PELVIC External genitalia - folliculitis on outer thigh Vagina -moderate secretion around clitoral area Cervix -  Normal in appearance Uterus - Copper IUD in place  [facility] - normal epithelial no clue, no trich,  KOH- Positive whiff GC/CHL collected  Assessment & Plan:   A:  1.  STI screening 2. Bacterial vaginosis  P:  1.  Metrogel Follow-up GC and Chlamydia PCR   By signing my name below, I, Arnette NorrisMari Johnson, attest that this documentation has been prepared under the direction and in the presence of Tilda BurrowFerguson, Aasim Restivo V, MD. Electronically Signed: Arnette NorrisMari Johnson Medical Scribe. 11/24/18. 3:57 PM.  I personally performed the services described in this documentation, which was SCRIBED in my presence. The recorded information has been reviewed and considered accurate. It has been edited as necessary during review. Tilda BurrowJohn V Daquon Greenleaf, MD

## 2018-12-02 ENCOUNTER — Telehealth: Payer: Self-pay | Admitting: Obstetrics and Gynecology

## 2018-12-02 ENCOUNTER — Other Ambulatory Visit: Payer: Self-pay | Admitting: Obstetrics and Gynecology

## 2018-12-02 LAB — GC/CHLAMYDIA PROBE AMP
Chlamydia trachomatis, NAA: POSITIVE — AB
NEISSERIA GONORRHOEAE BY PCR: NEGATIVE

## 2018-12-02 MED ORDER — AZITHROMYCIN 500 MG PO TABS: 1000.0000 mg | ORAL_TABLET | Freq: Once | ORAL | 1 refills | Status: AC

## 2018-12-02 NOTE — Progress Notes (Signed)
Azithromycin rx 1 gm with refil x1 (expedited partner tx) sent to pharmacy.

## 2018-12-02 NOTE — Telephone Encounter (Signed)
Pt informed of Rx for Azithromycin escribed to [facility].

## 2019-02-07 NOTE — Telephone Encounter (Signed)
Note was given °

## 2019-02-10 ENCOUNTER — Encounter: Payer: Self-pay | Admitting: Women's Health

## 2019-02-10 ENCOUNTER — Ambulatory Visit: Payer: Self-pay | Admitting: Women's Health

## 2019-02-10 VITALS — BP 119/67 | HR 87 | Ht 62.0 in | Wt 216.0 lb

## 2019-02-10 DIAGNOSIS — Z113 Encounter for screening for infections with a predominantly sexual mode of transmission: Secondary | ICD-10-CM

## 2019-02-10 DIAGNOSIS — L292 Pruritus vulvae: Secondary | ICD-10-CM

## 2019-02-10 DIAGNOSIS — B9689 Other specified bacterial agents as the cause of diseases classified elsewhere: Secondary | ICD-10-CM

## 2019-02-10 DIAGNOSIS — N760 Acute vaginitis: Secondary | ICD-10-CM

## 2019-02-10 LAB — POCT WET PREP (WET MOUNT)
Clue Cells Wet Prep Whiff POC: POSITIVE
Trichomonas Wet Prep HPF POC: ABSENT

## 2019-02-10 MED ORDER — METRONIDAZOLE 500 MG PO TABS: 500.0000 mg | ORAL_TABLET | Freq: Two times a day (BID) | ORAL | 0 refills | Status: DC

## 2019-02-10 NOTE — Progress Notes (Signed)
   GYN VISIT Patient name: Bailey Palmer MRN [id number]  Date of birth: 1996-03-11 Chief Complaint:   Vaginal Itching  History of Present Illness:   Bailey Palmer is a 23 y.o. G46P2002  female being seen today for vulvar itching, no odor/irritation.  Used otc vaginal itch cream and it helped.     No LMP recorded. (Menstrual status: IUD). The current method of family planning is IUD. Last pap 12/31/17. Results were:  normal Review of Systems:   Pertinent items are noted in HPI Denies fever/chills, dizziness, headaches, visual disturbances, fatigue, shortness of breath, chest pain, abdominal pain, vomiting, abnormal vaginal discharge/itching/odor/irritation, problems with periods, bowel movements, urination, or intercourse unless otherwise stated above.  Pertinent History Reviewed:  Reviewed past medical,surgical, social, obstetrical and family history.  Reviewed problem list, medications and allergies. Physical Assessment:   Vitals:   02/10/19 1500  BP: 119/67  Pulse: 87  Weight: 216 lb (98 kg)  Height: 5\' 2"  (1.575 m)  Body mass index is 39.51 kg/m.       Physical Examination:   General appearance: alert, well appearing, and in no distress  Mental status: alert, oriented to person, place, and time  Skin: warm & dry   Cardiovascular: normal heart rate noted  Respiratory: normal respiratory effort, no distress  Abdomen: soft, non-tender   Pelvic: VULVA: normal appearing vulva with no masses, tenderness or lesions, VAGINA: normal appearing vagina with normal color and malodorousdischarge, no lesions, CERVIX: normal appearing cervix without discharge or lesions, IUD strings visible  Extremities: no edema   Results for orders placed or performed in visit on 02/10/19 (from the past 24 hour(s))  POCT Wet Prep [facility] [place])   Collection Time: 02/10/19  3:18 PM  Result Value Ref Range   Source Wet Prep POC vaginal    WBC, Wet Prep HPF POC few    Bacteria Wet Prep HPF POC Few Few     BACTERIA WET PREP MORPHOLOGY POC     Clue Cells Wet Prep HPF POC Many (A) None   Clue Cells Wet Prep Whiff POC Positive Whiff    Yeast Wet Prep HPF POC None None   KOH Wet Prep POC     Trichomonas Wet Prep HPF POC Absent Absent    Assessment & Plan:  1) BV> Rx metronidazole 500mg  BID x 7d for BV, no sex or etoh while taking   2) STD screen> she also wants to check gc/ct  Meds:  Meds ordered this encounter  Medications  . metroNIDAZOLE (FLAGYL) 500 MG tablet    Sig: Take 1 tablet (500 mg total) by mouth 2 (two) times daily.    Dispense:  14 tablet    Refill:  0    Order Specific Question:   Supervising Provider    Answer:   Duane Lope H [[id number]]    Orders Placed This Encounter  Procedures  . GC/Chlamydia Probe Amp  . POCT Wet Prep [facility])    Return in about 1 year (around 02/10/2020) for Physical.  Cheral Marker CNM, [facility] 02/10/2019 3:20 PM

## 2019-02-11 LAB — GC/CHLAMYDIA PROBE AMP
Chlamydia trachomatis, NAA: NEGATIVE
NEISSERIA GONORRHOEAE BY PCR: NEGATIVE

## 2019-05-23 ENCOUNTER — Telehealth: Payer: Self-pay | Admitting: Women's Health

## 2019-05-23 NOTE — Telephone Encounter (Signed)
Patient states she has had some vaginal itching, no odor.  Has had BV before and it feels similar but slightly different.  Advised to try Monistat for a possible yeast infection and if that did not help, to let us know.  Verbalized understanding.

## 2019-05-23 NOTE — Telephone Encounter (Signed)
Pt states that she is having vaginal itching and requesting an appt or to see what can be done to help with the itching.

## 2019-05-30 DIAGNOSIS — Z3009 Encounter for other general counseling and advice on contraception: Secondary | ICD-10-CM | POA: Diagnosis not present

## 2019-05-30 DIAGNOSIS — Z0389 Encounter for observation for other suspected diseases and conditions ruled out: Secondary | ICD-10-CM | POA: Diagnosis not present

## 2019-05-30 DIAGNOSIS — Z1388 Encounter for screening for disorder due to exposure to contaminants: Secondary | ICD-10-CM | POA: Diagnosis not present

## 2019-07-01 ENCOUNTER — Other Ambulatory Visit: Payer: Medicaid Other

## 2019-07-01 DIAGNOSIS — R6889 Other general symptoms and signs: Secondary | ICD-10-CM | POA: Diagnosis not present

## 2019-07-01 DIAGNOSIS — Z20822 Contact with and (suspected) exposure to covid-19: Secondary | ICD-10-CM

## 2019-07-04 LAB — NOVEL CORONAVIRUS, NAA: SARS-CoV-2, NAA: NOT DETECTED

## 2019-07-18 ENCOUNTER — Encounter (HOSPITAL_COMMUNITY): Payer: Self-pay | Admitting: Emergency Medicine

## 2019-07-18 ENCOUNTER — Other Ambulatory Visit: Payer: Self-pay

## 2019-07-18 ENCOUNTER — Emergency Department (HOSPITAL_COMMUNITY)
Admission: EM | Admit: 2019-07-18 | Discharge: 2019-07-18 | Disposition: A | Discharge status: Home / Self Care | Payer: Medicaid Other | Attending: Emergency Medicine | Admitting: Emergency Medicine

## 2019-07-18 DIAGNOSIS — J029 Acute pharyngitis, unspecified: Secondary | ICD-10-CM | POA: Insufficient documentation

## 2019-07-18 LAB — GROUP A STREP BY PCR: Group A Strep by PCR: NOT DETECTED

## 2019-07-18 MED ORDER — PENICILLIN G BENZATHINE 1200000 UNIT/2ML IM SUSP
1.2000 10*6.[IU] | Freq: Once | INTRAMUSCULAR | Status: AC
  Administered 2019-07-18: 04:00:00 1.2 10*6.[IU] via INTRAMUSCULAR
  Filled 2019-07-18: qty 2

## 2019-07-18 NOTE — Discharge Instructions (Addendum)
Drink plenty of cold liquids or eat cold foods such as ice cream and popsicles for pain relief.  You can take children's liquid Motrin 600 mg plus children's liquid acetaminophen 1000 mg 4 times a day or every 6 hours for pain or fever.. You can gargle with 1 teaspoon of liquid Benadryl +1 teaspoon of Maalox especially right before eating.  If you swallow it it will just make you sleepy.  Recheck if you are unable to swallow, you have difficulty breathing, or you feel like is getting worse instead of better.

## 2019-07-18 NOTE — ED Provider Notes (Signed)
[facility] EMERGENCY DEPARTMENT Provider Note   CSN: [id number] Arrival date & time: 07/18/19  0212  Time seen 4:02 AM  History   Chief Complaint Chief Complaint  Patient presents with  . Sore Throat    HPI Bailey Palmer is a 23 y.o. female.     HPI patient states she started getting a sore throat on August 6.  She states she is able to eat but it hurts.  She is unsure but thinks she may have had a fever.  She has had some chills.  She denies nausea, vomiting, or rhinorrhea.  She states she has some pain on her right ear.  She states she was seen a few months ago and had a negative strep at that time when she had a sore throat.  She has had strep before however.  She has 2 small children at home age 76 and 69 but they are not sick.  She states she hurts more on the left side and states she sees some pus pockets on the left side.  PCP Sharilyn Sites, MD   Past Medical History:  Diagnosis Date  . Anemia    IDA  . Chronic abdominal pain   . Cyst near tailbone   . DUB (dysfunctional uterine bleeding)   . Heart murmur     Patient Active Problem List   Diagnosis Date Noted  . Encounter for insertion of ParaGard IUD 08/26/2018  . Ptyalism 12/16/2017  . Pilonidal cyst   . Chlamydia 02/20/2016    Past Surgical History:  Procedure Laterality Date  . APPENDECTOMY    . CHOLECYSTECTOMY    . MASS EXCISION N/A 04/24/2017   Procedure: EXCISION PILONDIAL CYST;  Surgeon: Aviva Signs, MD;  Location: [facility];  Service: General;  Laterality: N/A;     OB History    Gravida  2   Para  2   Term  2   Preterm      AB      Living  2     SAB      TAB      Ectopic      Multiple  0   Live Births  2            Home Medications    Prior to Admission medications   Medication Sig Start Date End Date Taking? Authorizing Provider  diphenhydrAMINE (BENADRYL) 25 mg capsule Take 25 mg by mouth every 6 (six) hours as needed for itching.    [provider]   metroNIDAZOLE (FLAGYL) 500 MG tablet Take 1 tablet (500 mg total) by mouth 2 (two) times daily. 02/10/19   Roma Schanz, CNM    Family History Family History  Problem Relation Age of Onset  . Asthma Mother   . Miscarriages / [place] Mother   . Varicose Veins Mother   . Alpha-1 antitrypsin deficiency Mother        carrier  . ADD / ADHD Brother   . Diabetes Maternal Grandmother   . Hypertension Maternal Grandmother   . Heart disease Maternal Grandmother   . Asthma Maternal Aunt   . Alpha-1 antitrypsin deficiency Maternal Aunt   . Depression Maternal Aunt     Social History Social History   Tobacco Use  . Smoking status: Never Smoker  . Smokeless tobacco: Never Used  Substance Use Topics  . Alcohol use: No    Alcohol/week: 0.0 standard drinks  . Drug use: No     Allergies  Patient has no known allergies.   Review of Systems Review of Systems  All other systems reviewed and are negative.    Physical Exam Updated Vital Signs BP 128/78 (BP Location: Right Arm)   Pulse (!) 105   Temp 98.3 F (36.8 C) (Tympanic)   Resp 20   Ht 5\' 3"  (1.6 m)   Wt 98.9 kg   LMP 07/04/2019   SpO2 100%   BMI 38.62 kg/m   Physical Exam Constitutional:      Appearance: Normal appearance.  HENT:     Head: Normocephalic and atraumatic.     Right Ear: External ear normal.     Left Ear: External ear normal.     Nose: Nose normal.     Mouth/Throat:     Mouth: Mucous membranes are moist.     Pharynx: Oropharyngeal exudate and posterior oropharyngeal erythema present.     Comments: Patient is noted to have diffuse redness around her tonsils and she has a large purulent area on her medial left tonsil.  There is no soft palate swelling or uvular deviation.  Her voice is normal. Eyes:     Extraocular Movements: Extraocular movements intact.     Conjunctiva/sclera: Conjunctivae normal.     Pupils: Pupils are equal, round, and reactive to light.  Neck:     Musculoskeletal:  Normal range of motion.  Cardiovascular:     Rate and Rhythm: Normal rate.  Pulmonary:     Effort: Pulmonary effort is normal. No respiratory distress.  Musculoskeletal: Normal range of motion.  Skin:    General: Skin is warm and dry.     Findings: No erythema.  Neurological:     General: No focal deficit present.     Mental Status: She is alert and oriented to person, place, and time.     Cranial Nerves: No cranial nerve deficit.  Psychiatric:        Mood and Affect: Mood normal.        Behavior: Behavior normal.        Thought Content: Thought content normal.      ED Treatments / Results  Labs (all labs ordered are listed, but only abnormal results are displayed) Labs Reviewed  GROUP A STREP BY PCR    EKG None  Radiology No results found.  Procedures Procedures (including critical care time)  Medications Ordered in ED Medications  penicillin g benzathine (BICILLIN LA) 1200000 UNIT/2ML injection 1.2 Million Units (1.2 Million Units Intramuscular Given 07/18/19 0426)     Initial Impression / Assessment and Plan / ED Course  I have reviewed the triage vital signs and the nursing notes.  Pertinent labs & imaging results that were available during my care of the patient were reviewed by me and considered in my medical decision making (see chart for details).    Patient's rapid strep was negative however she states she has difficulty having the swab done.  The way her tonsil looks I told her I be willing to give her a Bicillin injection because it looks like she has a strep infection.  She is agreeable.  We discussed symptomatic care at home.  Final Clinical Impressions(s) / ED Diagnoses   Final diagnoses:  Pharyngitis, unspecified etiology    ED Discharge Orders    None    OTC ibuprofen and acetaminophen  Plan discharge  Devoria AlbeIva Amontae Ng, MD, Concha PyoFACEP    Niang Mitcheltree, MD 07/18/19 [phone]

## 2019-07-18 NOTE — ED Triage Notes (Signed)
Patient has c/o of sore throat, ear fullness, headache, since Thursday, currently afebrile.

## 2019-07-19 DIAGNOSIS — J029 Acute pharyngitis, unspecified: Secondary | ICD-10-CM | POA: Diagnosis not present

## 2019-07-19 DIAGNOSIS — Z681 Body mass index (BMI) 19 or less, adult: Secondary | ICD-10-CM | POA: Diagnosis not present

## 2019-09-09 DIAGNOSIS — Z113 Encounter for screening for infections with a predominantly sexual mode of transmission: Secondary | ICD-10-CM | POA: Diagnosis not present

## 2019-09-09 DIAGNOSIS — Z1388 Encounter for screening for disorder due to exposure to contaminants: Secondary | ICD-10-CM | POA: Diagnosis not present

## 2019-09-09 DIAGNOSIS — Z3009 Encounter for other general counseling and advice on contraception: Secondary | ICD-10-CM | POA: Diagnosis not present

## 2019-09-09 DIAGNOSIS — Z114 Encounter for screening for human immunodeficiency virus [HIV]: Secondary | ICD-10-CM | POA: Diagnosis not present

## 2019-09-09 DIAGNOSIS — Z0389 Encounter for observation for other suspected diseases and conditions ruled out: Secondary | ICD-10-CM | POA: Diagnosis not present

## 2019-09-09 DIAGNOSIS — A5901 Trichomonal vulvovaginitis: Secondary | ICD-10-CM | POA: Diagnosis not present

## 2019-10-11 ENCOUNTER — Other Ambulatory Visit (HOSPITAL_COMMUNITY)
Admission: RE | Admit: 2019-10-11 | Discharge: 2019-10-11 | Disposition: A | Discharge status: Home / Self Care | Payer: Medicaid Other | Source: Ambulatory Visit | Attending: Adult Health | Admitting: Adult Health

## 2019-10-11 ENCOUNTER — Encounter: Payer: Self-pay | Admitting: Adult Health

## 2019-10-11 ENCOUNTER — Ambulatory Visit (INDEPENDENT_AMBULATORY_CARE_PROVIDER_SITE_OTHER): Payer: Medicaid Other | Admitting: Adult Health

## 2019-10-11 ENCOUNTER — Other Ambulatory Visit: Payer: Self-pay

## 2019-10-11 VITALS — BP 123/72 | HR 78 | Ht 63.0 in | Wt 215.0 lb

## 2019-10-11 DIAGNOSIS — N760 Acute vaginitis: Secondary | ICD-10-CM | POA: Diagnosis not present

## 2019-10-11 DIAGNOSIS — Z975 Presence of (intrauterine) contraceptive device: Secondary | ICD-10-CM | POA: Insufficient documentation

## 2019-10-11 DIAGNOSIS — N898 Other specified noninflammatory disorders of vagina: Secondary | ICD-10-CM | POA: Insufficient documentation

## 2019-10-11 DIAGNOSIS — B9689 Other specified bacterial agents as the cause of diseases classified elsewhere: Secondary | ICD-10-CM | POA: Insufficient documentation

## 2019-10-11 DIAGNOSIS — Z113 Encounter for screening for infections with a predominantly sexual mode of transmission: Secondary | ICD-10-CM | POA: Insufficient documentation

## 2019-10-11 LAB — POCT WET PREP (WET MOUNT)
Clue Cells Wet Prep Whiff POC: NEGATIVE
Trichomonas Wet Prep HPF POC: ABSENT
WBC, Wet Prep HPF POC: POSITIVE

## 2019-10-11 MED ORDER — METRONIDAZOLE 0.75 % VA GEL: 1.0000 | Freq: Every day | VAGINAL | 0 refills | Status: DC

## 2019-10-11 NOTE — Progress Notes (Signed)
  Subjective:     Patient ID: Bailey Palmer, female   DOB: [birth year], 23 y.o.   MRN: [id number]  HPI Bailey Palmer is a 23 year old white female, single, G2P2002, in complaining of vaginal itching for about a month and ?rash left labia.Has discharge, no odor was treated for trich and chlamydia in the past, and has history of BV. PCP is Dr Hilma Favors.   Review of Systems +vaginal itching for about a month ?rash left labia +discharge, no odor No sex lately  Reviewed past medical,surgical, social and family history. Reviewed medications and allergies.      Objective:   Physical Exam BP 123/72 (BP Location: Left Arm, Patient Position: Sitting, Cuff Size: Normal)   Pulse 78   Ht 5\' 3"  (1.6 m)   Wt 215 lb (97.5 kg)   LMP 09/09/2019 (Approximate)   BMI 38.09 kg/m   Skin warm and dry.Pelvic: external genitalia is normal in appearance no lesions, vagina: white,creamy discharge without odor, with red sidewalls,urethra has no lesions or masses noted, cervix:smooth and bulbous, +IUD strings at os, uterus: normal size, shape and contour, non tender, no masses felt, adnexa: no masses or tenderness noted. Bladder is non tender and no masses felt. Wet prep: + for clue cells and +WBCs. CV swab obtained. Examination chaperoned by Rolena Infante LPN  Fall risk is low PHQ 2 score 0.    Assessment:     1. Vaginal discharge   2. Vaginal itching   3. BV (bacterial vaginosis)   4. Screening examination for STD (sexually transmitted disease)   5. IUD (intrauterine device) in place       Plan:     CV swab sent Will rx metrogel Meds ordered this encounter  Medications  . metroNIDAZOLE (METROGEL) 0.75 % vaginal gel    Sig: Place 1 Applicatorful vaginally at bedtime.    Dispense:  70 g    Refill:  0    Order Specific Question:   Supervising Provider    Answer:   Tania Ade H [[id number]]  Follow up prn

## 2019-10-13 LAB — CERVICOVAGINAL ANCILLARY ONLY
Bacterial Vaginitis (gardnerella): POSITIVE — AB
Candida Glabrata: NEGATIVE
Candida Vaginitis: POSITIVE — AB
Chlamydia: NEGATIVE
Comment: NEGATIVE
Comment: NEGATIVE
Comment: NEGATIVE
Comment: NEGATIVE
Comment: NEGATIVE
Comment: NORMAL
Neisseria Gonorrhea: NEGATIVE
Trichomonas: NEGATIVE

## 2019-10-14 ENCOUNTER — Other Ambulatory Visit: Payer: Self-pay | Admitting: Adult Health

## 2019-10-14 MED ORDER — FLUCONAZOLE 150 MG PO TABS: ORAL_TABLET | ORAL | 1 refills | Status: DC

## 2019-10-14 NOTE — Progress Notes (Signed)
Will rx diflucan CV +BV was already treated with Metrogel and +yeast,

## 2019-12-27 ENCOUNTER — Telehealth: Payer: Self-pay | Admitting: *Deleted

## 2019-12-27 NOTE — Telephone Encounter (Signed)
Pt has visit with Britt Boozer tomorrow.

## 2019-12-27 NOTE — Telephone Encounter (Signed)
Patient left message that she has an abnormal discharge and is wanting to know if she needs to be seen.

## 2019-12-28 ENCOUNTER — Encounter: Payer: Self-pay | Admitting: Adult Health

## 2019-12-28 ENCOUNTER — Ambulatory Visit (INDEPENDENT_AMBULATORY_CARE_PROVIDER_SITE_OTHER): Payer: Medicaid Other | Admitting: Adult Health

## 2019-12-28 ENCOUNTER — Other Ambulatory Visit: Payer: Self-pay

## 2019-12-28 VITALS — BP 110/65 | HR 81 | Ht 63.0 in | Wt 216.0 lb

## 2019-12-28 DIAGNOSIS — Z975 Presence of (intrauterine) contraceptive device: Secondary | ICD-10-CM | POA: Diagnosis not present

## 2019-12-28 DIAGNOSIS — N898 Other specified noninflammatory disorders of vagina: Secondary | ICD-10-CM | POA: Diagnosis not present

## 2019-12-28 DIAGNOSIS — N760 Acute vaginitis: Secondary | ICD-10-CM

## 2019-12-28 DIAGNOSIS — Z113 Encounter for screening for infections with a predominantly sexual mode of transmission: Secondary | ICD-10-CM | POA: Diagnosis not present

## 2019-12-28 DIAGNOSIS — B9689 Other specified bacterial agents as the cause of diseases classified elsewhere: Secondary | ICD-10-CM

## 2019-12-28 LAB — POCT WET PREP (WET MOUNT)
Clue Cells Wet Prep Whiff POC: POSITIVE
WBC, Wet Prep HPF POC: POSITIVE

## 2019-12-28 MED ORDER — METRONIDAZOLE 0.75 % VA GEL: 1.0000 | Freq: Every day | VAGINAL | 1 refills | Status: DC

## 2019-12-28 NOTE — Progress Notes (Addendum)
  Subjective:     Patient ID: Bailey Palmer, female   DOB: May 24, 1996, 24 y.o.   MRN: [id number]  HPI Bailey Palmer is a 24 year old white female, single,G2P2, has paragard IUD in complaining of vaginal discharge and some irritation. PCP is Dr Phillips Odor.   Review of Systems +vaginal discharge with some irritation, no burning or odor  Reviewed past medical,surgical, social and family history. Reviewed medications and allergies.     Objective:   Physical Exam BP 110/65 (BP Location: Right Arm, Patient Position: Sitting, Cuff Size: Normal)   Pulse 81   Ht 5\' 3"  (1.6 m)   Wt 216 lb (98 kg)   BMI 38.26 kg/m   Skin warm and dry.Pelvic: external genitalia is normal in appearance no lesions, vagina: white discharge with odor,urethra has no lesions or masses noted, cervix: bulbous,+IUD strings at os, uterus: normal size, shape and contour, non tender, no masses felt, adnexa: no masses or tenderness noted. Bladder is non tender and no masses felt. Wet prep: + for clue cells and +WBCs. CV swab obtained  Examination chaperoned by Rash LPN.    Assessment:     1. Vaginal discharge  2. BV (bacterial vaginosis) Will rx metrogel Meds ordered this encounter  Medications  . metroNIDAZOLE (METROGEL VAGINAL) 0.75 % vaginal gel    Sig: Place 1 Applicatorful vaginally at bedtime.    Dispense:  70 g    Refill:  1    Order Specific Question:   Supervising Provider    Answer:   EURE, LUTHER H [[id number]]    3. IUD (intrauterine device) in place  4. Screening examination for STD (sexually transmitted disease) CV swab sent for GC/CHL, trich and BV    Plan:     Physical  Scheduled for 02/13/20

## 2019-12-29 LAB — CERVICOVAGINAL ANCILLARY ONLY
Bacterial Vaginitis (gardnerella): POSITIVE — AB
Chlamydia: POSITIVE — AB
Comment: NEGATIVE
Comment: NEGATIVE
Comment: NEGATIVE
Comment: NORMAL
Neisseria Gonorrhea: NEGATIVE
Trichomonas: NEGATIVE

## 2019-12-30 ENCOUNTER — Telehealth: Payer: Self-pay | Admitting: Adult Health

## 2019-12-30 DIAGNOSIS — A749 Chlamydial infection, unspecified: Secondary | ICD-10-CM

## 2019-12-30 MED ORDER — AZITHROMYCIN 500 MG PO TABS: ORAL_TABLET | ORAL | 0 refills | Status: DC

## 2019-12-30 NOTE — Telephone Encounter (Signed)
Pt aware that CV swab +BV which she has been treated for with flagyl and +chlamydia, will rx azithromycin 500 mg #2 2 po now, no sex and POC 2/18 at 4:10 pm with me Partner to clinic per pt, [facility] sent

## 2020-01-06 ENCOUNTER — Ambulatory Visit: Payer: Medicaid Other | Admitting: Adult Health

## 2020-01-26 ENCOUNTER — Ambulatory Visit: Payer: Medicaid Other | Admitting: Adult Health

## 2020-01-31 DIAGNOSIS — Z1388 Encounter for screening for disorder due to exposure to contaminants: Secondary | ICD-10-CM | POA: Diagnosis not present

## 2020-01-31 DIAGNOSIS — Z0389 Encounter for observation for other suspected diseases and conditions ruled out: Secondary | ICD-10-CM | POA: Diagnosis not present

## 2020-01-31 DIAGNOSIS — Z3009 Encounter for other general counseling and advice on contraception: Secondary | ICD-10-CM | POA: Diagnosis not present

## 2020-02-13 ENCOUNTER — Other Ambulatory Visit: Payer: Medicaid Other | Admitting: Adult Health

## 2020-02-16 ENCOUNTER — Other Ambulatory Visit: Payer: Self-pay

## 2020-02-16 ENCOUNTER — Encounter: Payer: Self-pay | Admitting: Adult Health

## 2020-02-16 ENCOUNTER — Other Ambulatory Visit (HOSPITAL_COMMUNITY)
Admission: RE | Admit: 2020-02-16 | Discharge: 2020-02-16 | Disposition: A | Discharge status: Home / Self Care | Payer: Medicaid Other | Source: Ambulatory Visit | Attending: Adult Health | Admitting: Adult Health

## 2020-02-16 ENCOUNTER — Ambulatory Visit (INDEPENDENT_AMBULATORY_CARE_PROVIDER_SITE_OTHER): Payer: Medicaid Other | Admitting: Adult Health

## 2020-02-16 VITALS — BP 116/65 | HR 91 | Ht 63.0 in | Wt 217.8 lb

## 2020-02-16 DIAGNOSIS — Z0000 Encounter for general adult medical examination without abnormal findings: Secondary | ICD-10-CM

## 2020-02-16 DIAGNOSIS — Z113 Encounter for screening for infections with a predominantly sexual mode of transmission: Secondary | ICD-10-CM | POA: Diagnosis not present

## 2020-02-16 DIAGNOSIS — Z975 Presence of (intrauterine) contraceptive device: Secondary | ICD-10-CM

## 2020-02-16 DIAGNOSIS — Z01419 Encounter for gynecological examination (general) (routine) without abnormal findings: Secondary | ICD-10-CM | POA: Insufficient documentation

## 2020-02-16 NOTE — Progress Notes (Signed)
Patient ID: Bailey Palmer, female   DOB: 1996-03-02, 24 y.o.   MRN: [id number] History of Present Illness: Bailey Palmer is a 24 year old white female, single, G2P2 in for a well woman gyn exam. She had a normal pap 12/31/17. PC is Dr Phillips Odor.   Current Medications, Allergies, Past Medical History, Past Surgical History, Family History and Social History were reviewed in [employer] record.     Review of Systems: Patient denies any headaches, hearing loss, fatigue, blurred vision, shortness of breath, chest pain, abdominal pain, problems with bowel movements, urination, or intercourse. No joint pain or mood swings. She has paragard IUD and is happy with it.    Physical Exam:BP 116/65 (BP Location: Left Arm, Patient Position: Sitting, Cuff Size: Normal)   Pulse 91   Ht 5\' 3"  (1.6 m)   Wt 217 lb 12.8 oz (98.8 kg)   BMI 38.58 kg/m LMP 02/10/20 General:  Well developed, well nourished, no acute distress Skin:  Warm and dry Neck:  Midline trachea, normal thyroid, good ROM, no lymphadenopathy Lungs; Clear to auscultation bilaterally Breast:  No dominant palpable mass, retraction, or nipple discharge Cardiovascular: Regular rate and rhythm Abdomen:  Soft, non tender, no hepatosplenomegaly Pelvic:  External genitalia is normal in appearance, no lesions.  The vagina is normal in appearance. Urethra has no lesions or masses. The cervix is bulbous,+IUD strings. CV swab obtained.  Uterus is felt to be normal size, shape, and contour.  No adnexal masses or tenderness noted.Bladder is non tender, no masses felt. Extremities/musculoskeletal:  No swelling or varicosities noted, no clubbing or cyanosis Psych:  No mood changes, alert and cooperative,seems happy Fall risk is low PHQ 2 score is 0. Examination chaperoned by 04/11/20 Rash LPN.   Impression and Plan: 1. Encounter for well woman exam with routine gynecological exam Pap and physical in 1 year   2. IUD (intrauterine  device) in place   3. Screening examination for STD (sexually transmitted disease) CV sent

## 2020-02-17 LAB — CERVICOVAGINAL ANCILLARY ONLY
Chlamydia: NEGATIVE
Comment: NEGATIVE
Comment: NEGATIVE
Comment: NORMAL
Neisseria Gonorrhea: NEGATIVE
Trichomonas: NEGATIVE

## 2020-05-03 ENCOUNTER — Encounter: Payer: Self-pay | Admitting: Adult Health

## 2020-05-03 ENCOUNTER — Ambulatory Visit: Payer: Medicaid Other | Admitting: Adult Health

## 2020-05-03 VITALS — BP 127/76 | HR 96 | Ht 63.0 in | Wt 218.0 lb

## 2020-05-03 DIAGNOSIS — B9689 Other specified bacterial agents as the cause of diseases classified elsewhere: Secondary | ICD-10-CM | POA: Diagnosis not present

## 2020-05-03 DIAGNOSIS — N760 Acute vaginitis: Secondary | ICD-10-CM | POA: Diagnosis not present

## 2020-05-03 DIAGNOSIS — N898 Other specified noninflammatory disorders of vagina: Secondary | ICD-10-CM

## 2020-05-03 DIAGNOSIS — Z975 Presence of (intrauterine) contraceptive device: Secondary | ICD-10-CM | POA: Diagnosis not present

## 2020-05-03 LAB — POCT WET PREP (WET MOUNT): WBC, Wet Prep HPF POC: POSITIVE

## 2020-05-03 MED ORDER — FLUCONAZOLE 150 MG PO TABS
ORAL_TABLET | ORAL | 1 refills | Status: DC
Start: 2020-05-03 — End: 2020-08-06

## 2020-05-03 MED ORDER — NYSTATIN-TRIAMCINOLONE 100000-0.1 UNIT/GM-% EX OINT
1.0000 | TOPICAL_OINTMENT | Freq: Two times a day (BID) | CUTANEOUS | 0 refills | Status: DC
Start: 2020-05-03 — End: 2021-01-10

## 2020-05-03 MED ORDER — METRONIDAZOLE 500 MG PO TABS
500.0000 mg | ORAL_TABLET | Freq: Two times a day (BID) | ORAL | 0 refills | Status: DC
Start: 2020-05-03 — End: 2020-08-06

## 2020-05-03 NOTE — Progress Notes (Signed)
  Subjective:     Patient ID: Bailey Palmer, female   DOB: [birth year], 24 y.o.   MRN: [id number]  HPI Bailey Palmer is a 24 year white female, G2P2 in complaining of vaginal discharge for 3 days and vaginal itching.She tried OTC meds without relief.  PCP is Dr Phillips Odor.  Review of Systems +vaginal discharge for 3 days +vaginal itching, tried OTC meds with out relief No new sex partners Reviewed past medical,surgical, social and family history. Reviewed medications and allergies.      Objective:   Physical Exam BP 127/76 (BP Location: Right Arm, Patient Position: Sitting, Cuff Size: Normal)   Pulse 96   Ht 5\' 3"  (1.6 m)   Wt 218 lb (98.9 kg)   BMI 38.62 kg/m    Skin warm and dry.Pelvic: external genitalia is normal in appearance no lesions, vagina: white discharge without odor,urethra has no lesions or masses noted, cervix:smooth and bulbous,+IUD strings uterus: normal size, shape and contour, non tender, no masses felt, adnexa: no masses or tenderness noted. Bladder is non tender and no masses felt. Wet prep: + for clue cells and +WBCs. Nuswab sent  Examination chaperoned by LPN Assessment:     1. Vaginal discharge Nuswab sent  2. Vaginal itching Will rx diflucan  3. BV (bacterial vaginosis) Will rx flagyl Meds ordered this encounter  Medications  . metroNIDAZOLE (FLAGYL) 500 MG tablet    Sig: Take 1 tablet (500 mg total) by mouth 2 (two) times daily.    Dispense:  14 tablet    Refill:  0    Order Specific Question:   Supervising Provider    Answer:   Nance Pear, LUTHER H [[id number]]  . fluconazole (DIFLUCAN) 150 MG tablet    Sig: Take 1 now and repeat 1 in days    Dispense:  2 tablet    Refill:  1    Order Specific Question:   Supervising Provider    Answer:   EURE, LUTHER H [[id number]]  . nystatin-triamcinolone ointment (MYCOLOG)    Sig: Apply 1 application topically 2 (two) times daily.    Dispense:  30 g    Refill:  0    Order Specific Question:   Supervising  Provider    Answer:   Despina Hidden, LUTHER H [[id number]]    4. IUD (intrauterine device) in place     Plan:     Follow up prn

## 2020-05-06 LAB — NUSWAB VAGINITIS PLUS (VG+)
Candida albicans, NAA: NEGATIVE
Candida glabrata, NAA: NEGATIVE
Chlamydia trachomatis, NAA: NEGATIVE
Neisseria gonorrhoeae, NAA: NEGATIVE
Trich vag by NAA: NEGATIVE

## 2020-05-06 LAB — SPECIMEN STATUS REPORT

## 2020-06-27 DIAGNOSIS — Z1331 Encounter for screening for depression: Secondary | ICD-10-CM | POA: Diagnosis not present

## 2020-06-27 DIAGNOSIS — Z6838 Body mass index (BMI) 38.0-38.9, adult: Secondary | ICD-10-CM | POA: Diagnosis not present

## 2020-06-27 DIAGNOSIS — B350 Tinea barbae and tinea capitis: Secondary | ICD-10-CM | POA: Diagnosis not present

## 2020-08-06 ENCOUNTER — Ambulatory Visit (INDEPENDENT_AMBULATORY_CARE_PROVIDER_SITE_OTHER): Payer: Medicaid Other | Admitting: Adult Health

## 2020-08-06 ENCOUNTER — Other Ambulatory Visit (HOSPITAL_COMMUNITY)
Admission: RE | Admit: 2020-08-06 | Discharge: 2020-08-06 | Disposition: A | Discharge status: Home / Self Care | Payer: Medicaid Other | Source: Ambulatory Visit | Attending: Adult Health | Admitting: Adult Health

## 2020-08-06 ENCOUNTER — Encounter: Payer: Self-pay | Admitting: Adult Health

## 2020-08-06 VITALS — BP 123/79 | HR 109 | Ht 63.0 in | Wt 218.5 lb

## 2020-08-06 DIAGNOSIS — N898 Other specified noninflammatory disorders of vagina: Secondary | ICD-10-CM | POA: Diagnosis not present

## 2020-08-06 DIAGNOSIS — Z113 Encounter for screening for infections with a predominantly sexual mode of transmission: Secondary | ICD-10-CM

## 2020-08-06 DIAGNOSIS — Z975 Presence of (intrauterine) contraceptive device: Secondary | ICD-10-CM | POA: Diagnosis not present

## 2020-08-06 MED ORDER — METRONIDAZOLE 500 MG PO TABS
500.0000 mg | ORAL_TABLET | Freq: Two times a day (BID) | ORAL | 0 refills | Status: DC
Start: 2020-08-06 — End: 2020-08-20

## 2020-08-06 NOTE — Progress Notes (Addendum)
  Subjective:     Patient ID: Bailey Palmer, female   DOB: August 08, 1996, 24 y.o.   MRN: [id number]  HPI Saddie is a 24 year old white female, single, G2P2 in complaining of vaginal itching for 1 1/2 weeks and has noticed discharge and odor, just finished her period. Has IUD but if this is BV, may want to change birth control. PCP is Dr Phillips Odor.   Review of Systems Has vaginal itching x 1 1/2 weeks  Has discharge and has noticed odor Denies any new sex partners Reviewed past medical,surgical, social and family history. Reviewed medications and allergies.     Objective:   Physical Exam BP 123/79 (BP Location: Left Arm, Patient Position: Sitting, Cuff Size: Large)   Pulse (!) 109   Ht 5\' 3"  (1.6 m)   Wt 218 lb 8 oz (99.1 kg)   LMP 08/02/2020   Breastfeeding No   BMI 38.71 kg/m  Skin warm and dry.Pelvic: external genitalia is normal in appearance no lesions, vagina: watery discharge with odor,urethra has no lesions or masses noted, cervix:smooth and bulbous, +IUD strings, uterus: normal size, shape and contour, non tender, no masses felt, adnexa: no masses or tenderness noted. Bladder is non tender and no masses felt. CV swab obtained. Examination chaperoned by 08/04/2020 LPN Fall risk is low.   Upstream - 08/06/20 1356      Pregnancy Intention Screening   Does the patient want to become pregnant in the next year? No    Does the patient's partner want to become pregnant in the next year? No    Would the patient like to discuss contraceptive options today? Yes      Contraception Wrap Up   Current Method IUD or IUS    End Method IUD or IUS    Contraception Counseling Provided Yes             Assessment:     1. Vaginal discharge CV swab sent   2. Vaginal itching CV swab sent  3. Vaginal odor CV swab sent Will rx flagyl for odor and talk when CV resulted. Meds ordered this encounter  Medications  . metroNIDAZOLE (FLAGYL) 500 MG tablet    Sig: Take 1 tablet (500  mg total) by mouth 2 (two) times daily.    Dispense:  14 tablet    Refill:  0    Order Specific Question:   Supervising Provider    Answer:   08/08/20, LUTHER H [[id number]]    4. IUD (intrauterine device) in place She is aware that IUDs can increase risk for BV   5. Screening examination for STD (sexually transmitted disease) CV swab sent Declines HIV     Plan:    Will talk when CV swab results back If +BV she wants IUD removed and try something else,she gained weight with nexplanon, and weighs too much for the patch, not sure will remember the pill, did mention the ring  Return in 5 months for pap and physical

## 2020-08-08 ENCOUNTER — Telehealth: Payer: Self-pay | Admitting: Adult Health

## 2020-08-08 ENCOUNTER — Encounter: Payer: Self-pay | Admitting: Adult Health

## 2020-08-08 DIAGNOSIS — A599 Trichomoniasis, unspecified: Secondary | ICD-10-CM

## 2020-08-08 HISTORY — DX: Trichomoniasis, unspecified: A59.9

## 2020-08-08 LAB — CERVICOVAGINAL ANCILLARY ONLY
Bacterial Vaginitis (gardnerella): POSITIVE — AB
Candida Glabrata: NEGATIVE
Candida Vaginitis: NEGATIVE
Chlamydia: NEGATIVE
Comment: NEGATIVE
Comment: NEGATIVE
Comment: NEGATIVE
Comment: NEGATIVE
Comment: NEGATIVE
Comment: NORMAL
Neisseria Gonorrhea: NEGATIVE
Trichomonas: POSITIVE — AB

## 2020-08-08 NOTE — Telephone Encounter (Signed)
Left message that CV swab was +BV and +trich, already treated with flagyl which will cover BV and trich, but partner needs to be treated too, no sex and then PCO in about 10 days to 2 weeks I treat him or he can go to his Primary doctor or the health dept, I will need his name,Dob, and drug store with any allergies.

## 2020-08-20 ENCOUNTER — Ambulatory Visit (INDEPENDENT_AMBULATORY_CARE_PROVIDER_SITE_OTHER): Payer: Medicaid Other | Admitting: Adult Health

## 2020-08-20 ENCOUNTER — Encounter: Payer: Self-pay | Admitting: Adult Health

## 2020-08-20 ENCOUNTER — Other Ambulatory Visit (HOSPITAL_COMMUNITY)
Admission: RE | Admit: 2020-08-20 | Discharge: 2020-08-20 | Disposition: A | Discharge status: Home / Self Care | Payer: Medicaid Other | Source: Ambulatory Visit | Attending: Adult Health | Admitting: Adult Health

## 2020-08-20 ENCOUNTER — Other Ambulatory Visit: Payer: Self-pay

## 2020-08-20 VITALS — BP 115/66 | HR 76 | Ht 63.0 in | Wt 219.0 lb

## 2020-08-20 DIAGNOSIS — Z975 Presence of (intrauterine) contraceptive device: Secondary | ICD-10-CM | POA: Diagnosis not present

## 2020-08-20 DIAGNOSIS — B9689 Other specified bacterial agents as the cause of diseases classified elsewhere: Secondary | ICD-10-CM | POA: Diagnosis not present

## 2020-08-20 DIAGNOSIS — Z8619 Personal history of other infectious and parasitic diseases: Secondary | ICD-10-CM | POA: Diagnosis not present

## 2020-08-20 DIAGNOSIS — N760 Acute vaginitis: Secondary | ICD-10-CM | POA: Diagnosis not present

## 2020-08-20 NOTE — Progress Notes (Signed)
  Subjective:     Patient ID: Bailey Palmer, female   DOB: 19-Oct-1996, 24 y.o.   MRN: [id number]  HPI Bailey Palmer is a 24 year old white female,single, G2P2 in for proof of cure for recent trich and BV. PCP is Dr Phillips Odor.   Review of Systems Denies any vaginal discharge, itching or odor,  She has not had  sex since treatment Reviewed past medical,surgical, social and family history. Reviewed medications and allergies.     Objective:   Physical Exam BP 115/66 (BP Location: Left Arm, Patient Position: Sitting, Cuff Size: Normal)   Pulse 76   Ht 5\' 3"  (1.6 m)   Wt 219 lb (99.3 kg)   LMP 08/02/2020   BMI 38.79 kg/m  Skin warm and dry.Pelvic: external genitalia is normal in appearance no lesions, vagina: white discharge without odor,urethra has no lesions or masses noted, cervix:smooth and bulbous,+IUD strings, uterus: normal size, shape and contour, non tender, no masses felt, adnexa: no masses or tenderness noted. Bladder is non tender and no masses felt. CV swab obtained    Assessment:     1. History of trichomoniasis For proof or cure CV swab sent   2. IUD (intrauterine device) in place  3. BV (bacterial vaginosis),was treated Has history of recurrent BV, CV swab sent    Plan:     Follow up prn

## 2020-08-21 LAB — CERVICOVAGINAL ANCILLARY ONLY
Bacterial Vaginitis (gardnerella): POSITIVE — AB
Comment: NEGATIVE
Comment: NEGATIVE
Trichomonas: NEGATIVE

## 2020-08-22 ENCOUNTER — Telehealth: Payer: Self-pay

## 2020-08-22 MED ORDER — METRONIDAZOLE 500 MG PO TABS
500.0000 mg | ORAL_TABLET | Freq: Two times a day (BID) | ORAL | 0 refills | Status: DC
Start: 2020-08-22 — End: 2021-01-10

## 2020-08-22 NOTE — Telephone Encounter (Signed)
New message    The patient asking for the nurse to call her back to - will discuss further .

## 2020-08-22 NOTE — Telephone Encounter (Signed)
+  BV on CV swab will rx flagyl  

## 2020-10-01 ENCOUNTER — Ambulatory Visit (INDEPENDENT_AMBULATORY_CARE_PROVIDER_SITE_OTHER): Payer: Medicaid Other | Admitting: Women's Health

## 2020-10-01 ENCOUNTER — Other Ambulatory Visit: Payer: Self-pay

## 2020-10-01 ENCOUNTER — Encounter: Payer: Self-pay | Admitting: Women's Health

## 2020-10-01 VITALS — BP 123/74 | HR 95 | Ht 63.0 in | Wt 220.0 lb

## 2020-10-01 DIAGNOSIS — N760 Acute vaginitis: Secondary | ICD-10-CM | POA: Diagnosis not present

## 2020-10-01 NOTE — Progress Notes (Signed)
   GYN VISIT Patient name: Bailey Palmer MRN [id number]  Date of birth: 1995/12/11 Chief Complaint:   Contraception (IUD Removal)  History of Present Illness:   Bailey Palmer is a 24 y.o. [phone] female being seen today initially for Paragard IUD removal d/t recurrent BV.  Pt has also had multiple +CT and trichomonas infections. Both neg as of 8/30 & 9/13. No current sx. States she only has 1 partner. Otherwise likes IUD. Discussed trying options to help try to prevent recurrent BV and keep IUD, pt would like to do this.  Depression screen [facility] 2/9 02/16/2020 10/11/2019 12/16/2017  Decreased Interest 0 0 0  Down, Depressed, Hopeless 0 0 0  PHQ - 2 Score 0 0 0  Altered sleeping - - 0  Tired, decreased energy - - 0  Change in appetite - - 0  Feeling bad or failure about yourself  - - 0  Trouble concentrating - - 0  Moving slowly or fidgety/restless - - 0  Suicidal thoughts - - 0  PHQ-9 Score - - 0  Difficult doing work/chores - - Not difficult at all    No LMP recorded. (Menstrual status: IUD). The current method of family planning is IUD.  Last pap 12/31/17. Results were:  normal Review of Systems:   Pertinent items are noted in HPI Denies fever/chills, dizziness, headaches, visual disturbances, fatigue, shortness of breath, chest pain, abdominal pain, vomiting, abnormal vaginal discharge/itching/odor/irritation, problems with periods, bowel movements, urination, or intercourse unless otherwise stated above.  Pertinent History Reviewed:  Reviewed past medical,surgical, social, obstetrical and family history.  Reviewed problem list, medications and allergies. Physical Assessment:   Vitals:   10/01/20 1533  BP: 123/74  Pulse: 95  Weight: 220 lb (99.8 kg)  Height: 5\' 3"  (1.6 m)  Body mass index is 38.97 kg/m.       Physical Examination:   General appearance: alert, well appearing, and in no distress  Mental status: alert, oriented to person, place, and time  Skin: warm & dry     Cardiovascular: normal heart rate noted  Respiratory: normal respiratory effort, no distress  Abdomen: soft, non-tender   Pelvic: examination not indicated  Extremities: no edema   Chaperone: N/A    No results found for this or any previous visit (from the past 24 hour(s)).  Assessment & Plan:  1) Recurrent BV> gave printed prevention/relief measures, try for at least 4wks if possible, if not helping can reschedule IUD removal. Condoms always for STD prevention.   Meds: No orders of the defined types were placed in this encounter.   No orders of the defined types were placed in this encounter.   Return for after 3/11 for , Pap & physical.  5/11 CNM, [facility] 10/01/2020 4:02 PM

## 2020-10-01 NOTE — Patient Instructions (Signed)
Recurrent Vaginitis Alternative Therapies  Happy V probiotics (online)  Both options are to be done after sexual intercourse, your period ends, and when you think you may have bacterial vaginosis (BV) or a yeast infection.  If symptoms persist you will need to schedule an appointment to be seen  1) Soak in tub of waist- high warm water with 1/2 cup of baking soda for at least 20 mins.  2) Soak 3 tampons in 1 tablespoon of fractionated (liquid form) coconut oil with 10 drops of Melaleuca (Tea Tree) essential oil, insert 1 saturated tampon vaginally at bedtime x 3 days.    You can purchase Melaleuca/Tea Tree oil online at [email or web address], with a [employer] or [employer] representative, or locally at:  [employer] [address] [place], [place] [address] [phone]  [facility] [address] [place], [place] [address] [phone]  You may also want to consider making the changes below:   Soap: Unscented Dove (white box light green writing)   Wash cloth: use a separate white washcloth for your genital area  Laundry detergent: Dreft or unscented Arm n[employer] Hammer   Underwear: White 100% cotton panties (NOT just cotton crouch)  Sanitary pads/tampons: Unscented only-If it doesn't SAY unscented it can have a scent/perfume     NO PERFUMES OR LOTIONS OR POTIONS in the genital area (may use regular KY)  Condoms: hypoallergenic only, non-dyed (no color)  Toilet paper: White unscented only

## 2020-10-17 DIAGNOSIS — Z6838 Body mass index (BMI) 38.0-38.9, adult: Secondary | ICD-10-CM | POA: Diagnosis not present

## 2020-10-17 DIAGNOSIS — N390 Urinary tract infection, site not specified: Secondary | ICD-10-CM | POA: Diagnosis not present

## 2020-10-23 DIAGNOSIS — R8290 Unspecified abnormal findings in urine: Secondary | ICD-10-CM | POA: Diagnosis not present

## 2020-10-23 DIAGNOSIS — Z6837 Body mass index (BMI) 37.0-37.9, adult: Secondary | ICD-10-CM | POA: Diagnosis not present

## 2020-10-23 DIAGNOSIS — M545 Low back pain, unspecified: Secondary | ICD-10-CM | POA: Diagnosis not present

## 2020-12-14 DIAGNOSIS — Z0001 Encounter for general adult medical examination with abnormal findings: Secondary | ICD-10-CM | POA: Diagnosis not present

## 2020-12-14 DIAGNOSIS — Z118 Encounter for screening for other infectious and parasitic diseases: Secondary | ICD-10-CM | POA: Diagnosis not present

## 2020-12-14 DIAGNOSIS — Z6838 Body mass index (BMI) 38.0-38.9, adult: Secondary | ICD-10-CM | POA: Diagnosis not present

## 2020-12-14 DIAGNOSIS — M5410 Radiculopathy, site unspecified: Secondary | ICD-10-CM | POA: Diagnosis not present

## 2021-01-10 ENCOUNTER — Other Ambulatory Visit: Payer: Self-pay

## 2021-01-10 ENCOUNTER — Encounter: Payer: Self-pay | Admitting: Women's Health

## 2021-01-10 ENCOUNTER — Other Ambulatory Visit (HOSPITAL_COMMUNITY)
Admission: RE | Admit: 2021-01-10 | Discharge: 2021-01-10 | Disposition: A | Discharge status: Home / Self Care | Payer: Medicaid Other | Source: Ambulatory Visit | Attending: Obstetrics & Gynecology | Admitting: Obstetrics & Gynecology

## 2021-01-10 ENCOUNTER — Ambulatory Visit (INDEPENDENT_AMBULATORY_CARE_PROVIDER_SITE_OTHER): Payer: Medicaid Other | Admitting: Women's Health

## 2021-01-10 VITALS — BP 123/69 | HR 89 | Ht 63.0 in | Wt 224.0 lb

## 2021-01-10 DIAGNOSIS — Z30432 Encounter for removal of intrauterine contraceptive device: Secondary | ICD-10-CM | POA: Diagnosis not present

## 2021-01-10 DIAGNOSIS — N898 Other specified noninflammatory disorders of vagina: Secondary | ICD-10-CM

## 2021-01-10 NOTE — Progress Notes (Signed)
   IUD REMOVAL  Patient name: Bailey Palmer MRN [id number]  Date of birth: Mar 08, 1996 Subjective Findings:   Bailey Palmer is a 25 y.o. [phone]  female being seen today for removal of a Paragard  IUD. Her IUD was placed 08/26/18.  She desires removal because of recurrent BV, says she's tried everything. Not sexually active, so doesn't want any other birth control right now. Wants STD/BV screen, having d/c w/ odor.. Signed copy of informed consent in chart.  Depression screen [facility] 2/9 02/16/2020 10/11/2019 12/16/2017  Decreased Interest 0 0 0  Down, Depressed, Hopeless 0 0 0  PHQ - 2 Score 0 0 0  Altered sleeping - - 0  Tired, decreased energy - - 0  Change in appetite - - 0  Feeling bad or failure about yourself  - - 0  Trouble concentrating - - 0  Moving slowly or fidgety/restless - - 0  Suicidal thoughts - - 0  PHQ-9 Score - - 0  Difficult doing work/chores - - Not difficult at all    No LMP recorded. (Menstrual status: IUD). Last pap1/24/19. Results were: NILM w/ HRHPV not done The planned method of family planning is abstinence Pertinent History Reviewed:   Reviewed past medical,surgical, social, obstetrical and family history.  Reviewed problem list, medications and allergies. Objective Findings & Procedure:    Vitals:   01/10/21 1513  BP: 123/69  Pulse: 89  Weight: 224 lb (101.6 kg)  Height: 5\' 3"  (1.6 m)  Body mass index is 39.68 kg/m.  No results found for this or any previous visit (from the past 24 hour(s)).   Time out was performed.  A graves speculum was placed in the vagina.  The cervix was visualized, and the strings were visible. They were grasped and the Paragard  IUD was easily removed intact without complications. The patient tolerated the procedure well.   Chaperone:   Assessment & Plan:   1) Paragard  IUD removal Follow-up prn problems  2) Vag d/c w/ odor> CV swab sent  No orders of the defined types were placed in this  encounter.   Follow-up: Return for 1st available, Pap & physical.  Faith Rogue CNM, [facility] 01/10/2021 3:52 PM

## 2021-01-13 LAB — CERVICOVAGINAL ANCILLARY ONLY
Bacterial Vaginitis (gardnerella): POSITIVE — AB
Candida Glabrata: NEGATIVE
Candida Vaginitis: NEGATIVE
Chlamydia: NEGATIVE
Comment: NEGATIVE
Comment: NEGATIVE
Comment: NEGATIVE
Comment: NEGATIVE
Comment: NEGATIVE
Comment: NORMAL
Neisseria Gonorrhea: NEGATIVE
Trichomonas: NEGATIVE

## 2021-01-14 ENCOUNTER — Other Ambulatory Visit: Payer: Self-pay | Admitting: Women's Health

## 2021-01-14 MED ORDER — METRONIDAZOLE 500 MG PO TABS: 500.0000 mg | ORAL_TABLET | Freq: Two times a day (BID) | ORAL | 0 refills | Status: DC

## 2021-01-15 ENCOUNTER — Telehealth: Payer: Self-pay

## 2021-01-15 NOTE — Telephone Encounter (Signed)
Pt needs medication sent to Pharmacy for the results of the labs on 01/10/21

## 2021-01-15 NOTE — Telephone Encounter (Signed)
LMOVM Medication was sent to pharmacy on 2/7.

## 2021-01-28 ENCOUNTER — Other Ambulatory Visit: Payer: Medicaid Other | Admitting: Women's Health

## 2021-03-19 ENCOUNTER — Other Ambulatory Visit: Payer: Medicaid Other | Admitting: Women's Health

## 2021-04-01 ENCOUNTER — Other Ambulatory Visit (HOSPITAL_COMMUNITY)
Admission: RE | Admit: 2021-04-01 | Discharge: 2021-04-01 | Disposition: A | Discharge status: Home / Self Care | Payer: Medicaid Other | Source: Ambulatory Visit | Attending: Obstetrics & Gynecology | Admitting: Obstetrics & Gynecology

## 2021-04-01 ENCOUNTER — Other Ambulatory Visit: Payer: Self-pay

## 2021-04-01 ENCOUNTER — Other Ambulatory Visit (INDEPENDENT_AMBULATORY_CARE_PROVIDER_SITE_OTHER): Payer: Medicaid Other | Admitting: *Deleted

## 2021-04-01 DIAGNOSIS — N898 Other specified noninflammatory disorders of vagina: Secondary | ICD-10-CM | POA: Insufficient documentation

## 2021-04-01 NOTE — Progress Notes (Signed)
   NURSE VISIT- VAGINITIS/STD/POC  SUBJECTIVE:  Bailey Palmer is a 25 y.o. [id number] GYN patientfemale here for a vaginal swab for vaginitis screening, STD screen.  She reports the following symptoms: local irritation for 5 days. Denies abnormal vaginal bleeding, significant pelvic pain, fever, or UTI symptoms.  OBJECTIVE:  There were no vitals taken for this visit.  Appears well, in no apparent distress  ASSESSMENT: Vaginal swab for STD screen and vaginitis  PLAN: Self-collected vaginal probe for Gonorrhea, Chlamydia, Trichomonas, Bacterial Vaginosis, Yeast sent to lab Treatment: to be determined once results are received Follow-up as needed if symptoms persist/worsen, or new symptoms develop  Annamarie Dawley  04/01/2021 2:49 PM

## 2021-04-01 NOTE — Progress Notes (Signed)
Chart reviewed for nurse visit. Agree with plan of care.  Adline Potter, NP 04/01/2021 4:12 PM

## 2021-04-03 LAB — CERVICOVAGINAL ANCILLARY ONLY
Bacterial Vaginitis (gardnerella): POSITIVE — AB
Candida Glabrata: NEGATIVE
Candida Vaginitis: POSITIVE — AB
Chlamydia: NEGATIVE
Comment: NEGATIVE
Comment: NEGATIVE
Comment: NEGATIVE
Comment: NEGATIVE
Comment: NEGATIVE
Comment: NORMAL
Neisseria Gonorrhea: NEGATIVE
Trichomonas: POSITIVE — AB

## 2021-04-04 ENCOUNTER — Telehealth: Payer: Self-pay | Admitting: Adult Health

## 2021-04-04 DIAGNOSIS — A599 Trichomoniasis, unspecified: Secondary | ICD-10-CM

## 2021-04-04 MED ORDER — FLUCONAZOLE 150 MG PO TABS: ORAL_TABLET | ORAL | 1 refills | Status: DC

## 2021-04-04 MED ORDER — METRONIDAZOLE 500 MG PO TABS: 500.0000 mg | ORAL_TABLET | Freq: Two times a day (BID) | ORAL | 0 refills | Status: DC
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
## 2021-04-17 ENCOUNTER — Other Ambulatory Visit: Payer: Self-pay

## 2021-04-17 ENCOUNTER — Other Ambulatory Visit (HOSPITAL_COMMUNITY)
Admission: RE | Admit: 2021-04-17 | Discharge: 2021-04-17 | Disposition: A | Discharge status: Home / Self Care | Payer: Medicaid Other | Source: Ambulatory Visit | Attending: Advanced Practice Midwife | Admitting: Advanced Practice Midwife

## 2021-04-17 ENCOUNTER — Ambulatory Visit (INDEPENDENT_AMBULATORY_CARE_PROVIDER_SITE_OTHER): Payer: Medicaid Other | Admitting: Advanced Practice Midwife

## 2021-04-17 ENCOUNTER — Encounter: Payer: Self-pay | Admitting: Advanced Practice Midwife

## 2021-04-17 VITALS — BP 109/70 | HR 84 | Ht 64.0 in | Wt 222.5 lb

## 2021-04-17 DIAGNOSIS — Z0000 Encounter for general adult medical examination without abnormal findings: Secondary | ICD-10-CM | POA: Insufficient documentation

## 2021-04-17 DIAGNOSIS — Z01419 Encounter for gynecological examination (general) (routine) without abnormal findings: Secondary | ICD-10-CM

## 2021-04-17 NOTE — Progress Notes (Signed)
WELL-WOMAN EXAMINATION Patient name: Bailey Palmer MRN [id number]  Date of birth: 02-11-1996 Chief Complaint:   Gynecologic Exam  History of Present Illness:   Bailey Palmer is a 25 y.o. [phone]  female being seen today for a routine well-woman exam.  Current complaints: doing well; had Paragard out Feb 2022 and has had reg cycles since- they are much lighter!; has sex occasionally and isn't interested in contraception currently; +trich 4/25- completed meds  Depression screen [facility] 2/9 04/17/2021 02/16/2020 10/11/2019 12/16/2017  Decreased Interest 0 0 0 0  Down, Depressed, Hopeless 0 0 0 0  PHQ - 2 Score 0 0 0 0  Altered sleeping 1 - - 0  Tired, decreased energy 0 - - 0  Change in appetite 0 - - 0  Feeling bad or failure about yourself  0 - - 0  Trouble concentrating 0 - - 0  Moving slowly or fidgety/restless 0 - - 0  Suicidal thoughts 0 - - 0  PHQ-9 Score 1 - - 0  Difficult doing work/chores - - - Not difficult at all     PCP: Dr Phillips Odor      does not desire labs Patient's last menstrual period was 04/14/2021. The current method of family planning is none.  Last pap Jan 2019. Results were: NILM w/ HRHPV not done. H/O abnormal pap: no Last mammogram: never. Results were: N/A. Family h/o breast cancer: no Last colonoscopy: never. Results were: N/A. Family h/o colorectal cancer: no Review of Systems:   Pertinent items are noted in HPI Denies any headaches, blurred vision, fatigue, shortness of breath, chest pain, abdominal pain, abnormal vaginal discharge/itching/odor/irritation, problems with periods, bowel movements, urination, or intercourse unless otherwise stated above. Pertinent History Reviewed:  Reviewed past medical,surgical, social and family history.  Reviewed problem list, medications and allergies. Physical Assessment:   Vitals:   04/17/21 0842  BP: 109/70  Pulse: 84  Weight: 222 lb 8 oz (100.9 kg)  Height: 5\' 4"  (1.626 m)  Body mass index is 38.19 kg/m.         Physical Examination:   General appearance - well appearing, and in no distress  Mental status - alert, oriented to person, place, and time  Psych:  She has a normal mood and affect  Skin - warm and dry, normal color, no suspicious lesions noted  Chest - effort normal, all lung fields clear to auscultation bilaterally  Heart - normal rate and regular rhythm  Neck:  midline trachea, no thyromegaly or nodules  Breasts - breasts appear normal, no suspicious masses, no skin or nipple changes or  axillary nodes  Abdomen - soft, nontender, nondistended, no masses or organomegaly  Pelvic - VULVA: normal appearing vulva with no masses, tenderness or lesions  VAGINA: normal appearing vagina with normal color and discharge, no lesions  CERVIX: normal appearing cervix without discharge or lesions, no CMT  Thin prep pap is done without HR HPV cotesting  UTERUS: uterus is felt to be normal size, shape, consistency and nontender   ADNEXA: No adnexal masses or tenderness noted.  Extremities:  No swelling or varicosities noted  Chaperone:    No results found for this or any previous visit (from the past 24 hour(s)).  Assessment & Plan:  1) Well-Woman Exam  2) Recent trich/BV- will check TOC today on Pap   Labs/procedures today: Pap/GC/chlam  Mammogram: @ 24yo, or sooner if problems Colonoscopy: @ 24yo, or sooner if problems  No orders of the  defined types were placed in this encounter.   Meds: No orders of the defined types were placed in this encounter.   Follow-up: Return in about 1 year (around 04/17/2022) for Physical.  [name] [facility] 04/17/2021 9:02 AM

## 2021-04-19 LAB — CYTOLOGY - PAP
Chlamydia: NEGATIVE
Comment: NEGATIVE
Comment: NORMAL
Diagnosis: NEGATIVE
Neisseria Gonorrhea: NEGATIVE

## 2021-07-08 DIAGNOSIS — Z0000 Encounter for general adult medical examination without abnormal findings: Secondary | ICD-10-CM | POA: Diagnosis not present

## 2021-07-08 DIAGNOSIS — Z6837 Body mass index (BMI) 37.0-37.9, adult: Secondary | ICD-10-CM | POA: Diagnosis not present

## 2021-07-08 DIAGNOSIS — Z111 Encounter for screening for respiratory tuberculosis: Secondary | ICD-10-CM | POA: Diagnosis not present

## 2021-07-10 DIAGNOSIS — Z6837 Body mass index (BMI) 37.0-37.9, adult: Secondary | ICD-10-CM | POA: Diagnosis not present

## 2021-07-10 DIAGNOSIS — Z0000 Encounter for general adult medical examination without abnormal findings: Secondary | ICD-10-CM | POA: Diagnosis not present

## 2021-07-10 DIAGNOSIS — D649 Anemia, unspecified: Secondary | ICD-10-CM | POA: Diagnosis not present

## 2021-08-08 ENCOUNTER — Other Ambulatory Visit: Payer: Medicaid Other

## 2021-10-18 DIAGNOSIS — J029 Acute pharyngitis, unspecified: Secondary | ICD-10-CM | POA: Diagnosis not present

## 2021-10-18 DIAGNOSIS — J069 Acute upper respiratory infection, unspecified: Secondary | ICD-10-CM | POA: Diagnosis not present

## 2021-10-18 DIAGNOSIS — J209 Acute bronchitis, unspecified: Secondary | ICD-10-CM | POA: Diagnosis not present

## 2021-11-21 ENCOUNTER — Other Ambulatory Visit (INDEPENDENT_AMBULATORY_CARE_PROVIDER_SITE_OTHER): Payer: Medicaid Other | Admitting: *Deleted

## 2021-11-21 ENCOUNTER — Other Ambulatory Visit (HOSPITAL_COMMUNITY)
Admission: RE | Admit: 2021-11-21 | Discharge: 2021-11-21 | Disposition: A | Discharge status: Home / Self Care | Payer: Medicaid Other | Source: Ambulatory Visit | Attending: Obstetrics & Gynecology | Admitting: Obstetrics & Gynecology

## 2021-11-21 ENCOUNTER — Other Ambulatory Visit: Payer: Self-pay

## 2021-11-21 DIAGNOSIS — Z113 Encounter for screening for infections with a predominantly sexual mode of transmission: Secondary | ICD-10-CM | POA: Insufficient documentation

## 2021-11-21 NOTE — Progress Notes (Signed)
Chart reviewed for nurse visit. Agree with plan of care.  Adline Potter, NP 11/21/2021 12:19 PM

## 2021-11-21 NOTE — Progress Notes (Signed)
° °  NURSE VISIT- VAGINITIS/STD/POC  SUBJECTIVE:  Bailey Palmer is a 25 y.o. [id number] GYN patientfemale here for a vaginal swab for STD screen.  She reports the following symptoms: odor and vulvar itching for unknown days. Denies abnormal vaginal bleeding, significant pelvic pain, fever, or UTI symptoms.  OBJECTIVE:  There were no vitals taken for this visit.  Appears well, in no apparent distress  ASSESSMENT: Vaginal swab for STD screen  PLAN: Self-collected vaginal probe for Gonorrhea, Chlamydia, Trichomonas, Bacterial Vaginosis, Yeast sent to lab Treatment: to be determined once results are received Follow-up as needed if symptoms persist/worsen, or new symptoms develop  Annamarie Dawley  11/21/2021 11:21 AM

## 2021-11-25 LAB — CERVICOVAGINAL ANCILLARY ONLY
Bacterial Vaginitis (gardnerella): POSITIVE — AB
Candida Glabrata: NEGATIVE
Candida Vaginitis: POSITIVE — AB
Chlamydia: NEGATIVE
Comment: NEGATIVE
Comment: NEGATIVE
Comment: NEGATIVE
Comment: NEGATIVE
Comment: NEGATIVE
Comment: NORMAL
Neisseria Gonorrhea: NEGATIVE
Trichomonas: NEGATIVE

## 2021-11-26 ENCOUNTER — Other Ambulatory Visit: Payer: Self-pay | Admitting: Adult Health

## 2021-11-26 MED ORDER — METRONIDAZOLE 500 MG PO TABS: 500.0000 mg | ORAL_TABLET | Freq: Two times a day (BID) | ORAL | 0 refills | Status: DC

## 2021-11-26 MED ORDER — FLUCONAZOLE 150 MG PO TABS: ORAL_TABLET | ORAL | 1 refills | Status: DC

## 2021-11-26 NOTE — Progress Notes (Signed)
+  BV and yeast on vaginal swab, will rx flagyl and diflucan 

## 2022-04-22 ENCOUNTER — Encounter: Payer: Self-pay | Admitting: *Deleted

## 2022-04-24 ENCOUNTER — Other Ambulatory Visit (HOSPITAL_COMMUNITY)
Admission: RE | Admit: 2022-04-24 | Discharge: 2022-04-24 | Disposition: A | Discharge status: Home / Self Care | Payer: Medicaid Other | Source: Ambulatory Visit | Attending: Obstetrics & Gynecology | Admitting: Obstetrics & Gynecology

## 2022-04-24 ENCOUNTER — Other Ambulatory Visit (INDEPENDENT_AMBULATORY_CARE_PROVIDER_SITE_OTHER): Payer: Medicaid Other | Admitting: *Deleted

## 2022-04-24 DIAGNOSIS — N898 Other specified noninflammatory disorders of vagina: Secondary | ICD-10-CM | POA: Insufficient documentation

## 2022-04-24 NOTE — Progress Notes (Signed)
   NURSE VISIT- VAGINITIS/STD/POC  SUBJECTIVE:  Bailey Palmer is a 26 y.o. [phone] GYN patientfemale here for a vaginal swab for vaginitis screening, STD screen.  She reports the following symptoms:  vaginal itching & odor  for 4 days. Denies abnormal vaginal bleeding, significant pelvic pain, fever, or UTI symptoms.  OBJECTIVE:  There were no vitals taken for this visit.  Appears well, in no apparent distress  ASSESSMENT: Vaginal swab for vaginitis screening & STD screening  PLAN: Self-collected vaginal probe for Gonorrhea, Chlamydia, Trichomonas, Bacterial Vaginosis, Yeast sent to lab Treatment: to be determined once results are received Follow-up as needed if symptoms persist/worsen, or new symptoms develop  Levy Pupa  04/24/2022 9:37 AM

## 2022-04-25 ENCOUNTER — Other Ambulatory Visit: Payer: Self-pay | Admitting: Adult Health

## 2022-04-25 LAB — CERVICOVAGINAL ANCILLARY ONLY
Bacterial Vaginitis (gardnerella): POSITIVE — AB
Candida Glabrata: NEGATIVE
Candida Vaginitis: NEGATIVE
Chlamydia: NEGATIVE
Comment: NEGATIVE
Comment: NEGATIVE
Comment: NEGATIVE
Comment: NEGATIVE
Comment: NEGATIVE
Comment: NORMAL
Neisseria Gonorrhea: NEGATIVE
Trichomonas: NEGATIVE

## 2022-04-25 MED ORDER — METRONIDAZOLE 500 MG PO TABS: 500.0000 mg | ORAL_TABLET | Freq: Two times a day (BID) | ORAL | 0 refills | Status: DC

## 2022-04-25 NOTE — Progress Notes (Signed)
+  BV on vaginal swab will  rx flagyl,no sex or alcohol during treatment  °

## 2022-05-09 ENCOUNTER — Encounter: Payer: Self-pay | Admitting: Emergency Medicine

## 2022-05-09 ENCOUNTER — Ambulatory Visit
Admission: EM | Admit: 2022-05-09 | Discharge: 2022-05-09 | Disposition: A | Discharge status: Home / Self Care | Payer: Medicaid Other | Attending: Nurse Practitioner | Admitting: Nurse Practitioner

## 2022-05-09 DIAGNOSIS — Z23 Encounter for immunization: Secondary | ICD-10-CM | POA: Diagnosis not present

## 2022-05-09 DIAGNOSIS — S0101XA Laceration without foreign body of scalp, initial encounter: Secondary | ICD-10-CM | POA: Diagnosis not present

## 2022-05-09 MED ORDER — TETANUS-DIPHTH-ACELL PERTUSSIS 5-2.5-18.5 LF-MCG/0.5 IM SUSY
0.5000 mL | PREFILLED_SYRINGE | Freq: Once | INTRAMUSCULAR | Status: AC
  Administered 2022-05-09: 0.5 mL via INTRAMUSCULAR

## 2022-05-09 MED ORDER — LIDOCAINE-EPINEPHRINE-TETRACAINE (LET) TOPICAL GEL
3.0000 mL | Freq: Once | TOPICAL | Status: AC
  Administered 2022-05-09: 3 mL via TOPICAL

## 2022-05-09 MED ORDER — ACETAMINOPHEN 325 MG PO TABS
975.0000 mg | ORAL_TABLET | Freq: Once | ORAL | Status: AC
  Administered 2022-05-09: 975 mg via ORAL
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
## 2022-05-09 NOTE — Discharge Instructions (Addendum)
-   We put 3 staples in your scalp today to close the wound - You can shower and bathe normally - You can use Tylenol and ibuprofen to help with pain - Please return if you develop redness or drainage from the area - Return to urgent care in 7-10 days to have staples removed
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
## 2022-05-20 ENCOUNTER — Other Ambulatory Visit: Payer: Self-pay

## 2022-05-20 ENCOUNTER — Ambulatory Visit: Payer: Medicaid Other

## 2022-05-20 ENCOUNTER — Encounter: Payer: Self-pay | Admitting: Emergency Medicine

## 2022-05-20 ENCOUNTER — Ambulatory Visit
Admission: EM | Admit: 2022-05-20 | Discharge: 2022-05-20 | Disposition: A | Discharge status: Home / Self Care | Payer: Medicaid Other

## 2022-05-20 DIAGNOSIS — S0101XD Laceration without foreign body of scalp, subsequent encounter: Secondary | ICD-10-CM

## 2022-05-20 DIAGNOSIS — Z4802 Encounter for removal of sutures: Secondary | ICD-10-CM

## 2022-05-20 NOTE — ED Triage Notes (Signed)
Pt reports had staples placed approximately 11 days ago. Pt presents to have them removed.

## 2022-06-18 ENCOUNTER — Other Ambulatory Visit: Payer: Medicaid Other

## 2022-06-23 ENCOUNTER — Telehealth: Payer: Self-pay

## 2022-06-23 NOTE — Telephone Encounter (Signed)
Pt wants to come in for a self swab

## 2022-07-01 ENCOUNTER — Other Ambulatory Visit (HOSPITAL_COMMUNITY)
Admission: RE | Admit: 2022-07-01 | Discharge: 2022-07-01 | Disposition: A | Discharge status: Home / Self Care | Payer: Medicaid Other | Source: Ambulatory Visit | Attending: Obstetrics & Gynecology | Admitting: Obstetrics & Gynecology

## 2022-07-01 ENCOUNTER — Other Ambulatory Visit (INDEPENDENT_AMBULATORY_CARE_PROVIDER_SITE_OTHER): Payer: Medicaid Other | Admitting: *Deleted

## 2022-07-01 DIAGNOSIS — Z113 Encounter for screening for infections with a predominantly sexual mode of transmission: Secondary | ICD-10-CM

## 2022-07-01 DIAGNOSIS — N898 Other specified noninflammatory disorders of vagina: Secondary | ICD-10-CM | POA: Diagnosis not present

## 2022-07-01 NOTE — Progress Notes (Signed)
   NURSE VISIT- VAGINITIS/STD  SUBJECTIVE:  Bailey Palmer is a 26 y.o. [id number] GYN patientfemale here for a vaginal swab for vaginitis screening, STD screen.  She reports the following symptoms:  vaginal odor  for several days. Partner has tested + for GC.  Denies abnormal vaginal bleeding, significant pelvic pain, fever, or UTI symptoms.  OBJECTIVE:  There were no vitals taken for this visit.  Appears well, in no apparent distress  ASSESSMENT: Vaginal swab for vaginitis screening & STD screening.   PLAN: Self-collected vaginal probe for Gonorrhea, Chlamydia, Trichomonas, Bacterial Vaginosis, Yeast sent to lab Treatment: to be determined once results are received Follow-up as needed if symptoms persist/worsen, or new symptoms develop  Malachy Mood  07/01/2022 2:26 PM

## 2022-07-03 LAB — CERVICOVAGINAL ANCILLARY ONLY
Bacterial Vaginitis (gardnerella): POSITIVE — AB
Candida Glabrata: NEGATIVE
Candida Vaginitis: NEGATIVE
Chlamydia: NEGATIVE
Comment: NEGATIVE
Comment: NEGATIVE
Comment: NEGATIVE
Comment: NEGATIVE
Comment: NEGATIVE
Comment: NORMAL
Neisseria Gonorrhea: POSITIVE — AB
Trichomonas: NEGATIVE

## 2022-07-04 ENCOUNTER — Ambulatory Visit (INDEPENDENT_AMBULATORY_CARE_PROVIDER_SITE_OTHER): Payer: Medicaid Other | Admitting: *Deleted

## 2022-07-04 ENCOUNTER — Encounter: Payer: Self-pay | Admitting: *Deleted

## 2022-07-04 ENCOUNTER — Telehealth: Payer: Self-pay | Admitting: Adult Health

## 2022-07-04 ENCOUNTER — Encounter: Payer: Self-pay | Admitting: Adult Health

## 2022-07-04 DIAGNOSIS — A549 Gonococcal infection, unspecified: Secondary | ICD-10-CM | POA: Insufficient documentation

## 2022-07-04 MED ORDER — METRONIDAZOLE 500 MG PO TABS: 500.0000 mg | ORAL_TABLET | Freq: Two times a day (BID) | ORAL | 0 refills | Status: DC

## 2022-07-04 MED ORDER — CEFTRIAXONE SODIUM 500 MG IJ SOLR
500.0000 mg | Freq: Once | INTRAMUSCULAR | Status: AC
  Administered 2022-07-04: 500 mg via INTRAMUSCULAR

## 2022-07-04 NOTE — Progress Notes (Signed)
   NURSE VISIT- INJECTION  SUBJECTIVE:  Bailey Palmer is a 26 y.o. G43P2002 female here for a Rocephin for treatment for gonorrhea. She is a GYN patient.   OBJECTIVE:  LMP 06/17/2022   Appears well, in no apparent distress  Injection administered in: Right upper quad. gluteus  Meds ordered this encounter  Medications   cefTRIAXone (ROCEPHIN) injection 500 mg    ASSESSMENT: GYN patient Rocephin for treatment for gonorrhea PLAN: Follow-up: in 4 weeks for proof of cure   Malachy Mood  07/04/2022 1:37 PM

## 2022-07-04 NOTE — Telephone Encounter (Signed)
Pt aware that Vaginal swab negative for BV and GC, will rx flagyl, and to come in today at 1 pm for rocephin, partner has been treated she says

## 2022-07-08 ENCOUNTER — Ambulatory Visit
Admission: RE | Admit: 2022-07-08 | Discharge: 2022-07-08 | Disposition: A | Discharge status: Home / Self Care | Payer: Medicaid Other | Source: Ambulatory Visit | Attending: Nurse Practitioner | Admitting: Nurse Practitioner

## 2022-07-08 VITALS — BP 114/74 | HR 72 | Temp 98.8°F | Resp 18

## 2022-07-08 DIAGNOSIS — M5441 Lumbago with sciatica, right side: Secondary | ICD-10-CM | POA: Diagnosis not present

## 2022-07-08 MED ORDER — DEXAMETHASONE SODIUM PHOSPHATE 10 MG/ML IJ SOLN
10.0000 mg | INTRAMUSCULAR | Status: AC
  Administered 2022-07-08: 10 mg via INTRAMUSCULAR

## 2022-07-08 MED ORDER — IBUPROFEN 800 MG PO TABS
800.0000 mg | ORAL_TABLET | Freq: Three times a day (TID) | ORAL | 0 refills | Status: DC | PRN
Start: 2022-07-08 — End: 2022-12-04

## 2022-07-08 NOTE — ED Triage Notes (Signed)
Lower back pain on right side since Sunday.  States the pain feel like the pain she had when she had sciatica last year.  Hurts worse when she bends

## 2022-07-08 NOTE — ED Provider Notes (Signed)
[facility]    CSN: [id number] Arrival date & time: 07/08/22  1249      History   Chief Complaint Chief Complaint  Patient presents with   Joint Pain    Entered by patient    HPI Bailey Palmer is a 26 y.o. female.   The history is provided by the patient.   Patient presents for right-sided low back pain that started approximately 2 days ago.  Patient states pains feel similar to when she had sciatica previously.  Pain is present in the right buttock without radiation.  Patient denies back stiffness, spasm, numbness, tingling, lower extremity weakness, or loss of bowel or bladder function.  She states that her pain worsens with bending, twisting, and turning.  States that she took ibuprofen at home for her symptoms.  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  No previous history of cancer, immunosuppression, or other spinal etiology.  Past Medical History:  Diagnosis Date   Anemia    IDA   Chronic abdominal pain    Cyst near tailbone    DUB (dysfunctional uterine bleeding)    Gonorrhea 07/04/2022   07/04/22 to get rocephin today in office, POC________   Heart murmur    Trichimoniasis 08/08/2020   Treated with flagyl for BV on 8/31 which will cover trich too.     Patient Active Problem List   Diagnosis Date Noted   Gonorrhea 07/04/2022   Pilonidal cyst    Chlamydia 02/20/2016    Past Surgical History:  Procedure Laterality Date   APPENDECTOMY     CHOLECYSTECTOMY     MASS EXCISION N/A 04/24/2017   Procedure: EXCISION PILONDIAL CYST;  Surgeon: Franky Macho, MD;  Location: [facility];  Service: General;  Laterality: N/A;    OB History     Gravida  2   Para  2   Term  2   Preterm      AB      Living  2      SAB      IAB      Ectopic      Multiple  0   Live Births  2            Home Medications    Prior to Admission medications   Medication Sig Start Date End Date Taking?  Authorizing Provider  ibuprofen (ADVIL) 800 MG tablet Take 1 tablet (800 mg total) by mouth every 8 (eight) hours as needed. 07/08/22  Yes Nygel Prokop-Warren, Sadie Haber, NP  metroNIDAZOLE (FLAGYL) 500 MG tablet Take 1 tablet (500 mg total) by mouth 2 (two) times daily. 07/04/22   Adline Potter, NP    Family History Family History  Problem Relation Age of Onset   Asthma Mother    Miscarriages / [place] Mother    Varicose Veins Mother    Alpha-1 antitrypsin deficiency Mother        carrier   ADD / ADHD Brother    Diabetes Maternal Grandmother    Hypertension Maternal Grandmother    Heart disease Maternal Grandmother    Asthma Maternal Aunt    Alpha-1 antitrypsin deficiency Maternal Aunt    Depression Maternal Aunt     Social History Social History   Tobacco Use   Smoking status: Never   Smokeless tobacco: Never  Vaping Use   Vaping Use: Never used  Substance Use Topics   Alcohol use: No  Alcohol/week: 0.0 standard drinks of alcohol   Drug use: No     Allergies   Patient has no known allergies.   Review of Systems Review of Systems Per HPI  Physical Exam Triage Vital Signs ED Triage Vitals  Enc Vitals Group     BP 07/08/22 1308 114/74     Pulse Rate 07/08/22 1308 72     Resp 07/08/22 1308 18     Temp 07/08/22 1308 98.8 F (37.1 C)     Temp Source 07/08/22 1308 Oral     SpO2 07/08/22 1308 98 %     Weight --      Height --      Head Circumference --      Peak Flow --      Pain Score 07/08/22 1310 8     Pain Loc --      Pain Edu? --      Excl. in GC? --    No data found.  Updated Vital Signs BP 114/74 (BP Location: Right Arm)   Pulse 72   Temp 98.8 F (37.1 C) (Oral)   Resp 18   LMP 06/17/2022   SpO2 98%   Visual Acuity Right Eye Distance:   Left Eye Distance:   Bilateral Distance:    Right Eye Near:   Left Eye Near:    Bilateral Near:     Physical Exam Vitals and nursing note reviewed.  Constitutional:      Appearance: Normal  appearance.  HENT:     Head: Normocephalic.  Cardiovascular:     Rate and Rhythm: Normal rate and regular rhythm.     Pulses: Normal pulses.     Heart sounds: Normal heart sounds.  Pulmonary:     Effort: Pulmonary effort is normal.     Breath sounds: Normal breath sounds.  Abdominal:     General: Bowel sounds are normal.     Palpations: Abdomen is soft.  Musculoskeletal:     Cervical back: Normal range of motion.     Lumbar back: Tenderness (right buttock/sciatic nerve) present. No swelling or spasms. Decreased range of motion. Negative right straight leg raise test and negative left straight leg raise test.  Lymphadenopathy:     Cervical: No cervical adenopathy.  Skin:    General: Skin is warm and dry.  Neurological:     General: No focal deficit present.     Mental Status: She is alert and oriented to person, place, and time.  Psychiatric:        Mood and Affect: Mood normal.        Behavior: Behavior normal.      UC Treatments / Results  Labs (all labs ordered are listed, but only abnormal results are displayed) Labs Reviewed - No data to display  EKG   Radiology No results found.  Procedures Procedures (including critical care time)  Medications Ordered in UC Medications  dexamethasone (DECADRON) injection 10 mg (10 mg Intramuscular Given 07/08/22 1336)    Initial Impression / Assessment and Plan / UC Course  I have reviewed the triage vital signs and the nursing notes.  Pertinent labs & imaging results that were available during my care of the patient were reviewed by me and considered in my medical decision making (see chart for details).  Patient presents for complaints of right-sided low back pain has been present for the past 2 days.  On exam, patient has no red flag symptoms.  She does have tenderness to the  right buttock/sciatic nerve region.  Symptoms are consistent with sciatica at this time.  Decadron 10 mg IM injection given in the clinic.  Will  start patient on Ibuprofen 800 mg every 8 hours as needed for pain.  Patient was also provided stretching exercises to help with her symptoms.  Supportive care recommendations were provided to the patient.  Patient was given strict indications of when to go to the emergency department.  Patient advised to follow-up with her primary care physician if symptoms do not improve.  Final Clinical Impressions(s) / UC Diagnoses   Final diagnoses:  Right-sided low back pain with right-sided sciatica, unspecified chronicity     Discharge Instructions      Take medication as prescribed.  Take ibuprofen with food and water. Perform the stretching exercises provided. As discussed, recommend the use of ice or heat.  Ice will help with pain and swelling, heat for spasm or stiffness.  Apply for 20 minutes, remove for 1 hour, then repeat. Go to the emergency department immediately if you develop numbness, tingling, inability to walk, or loss of bowel or bladder function. Follow-up with your primary care physician if symptoms do not improve.     ED Prescriptions     Medication Sig Dispense Auth. Provider   ibuprofen (ADVIL) 800 MG tablet Take 1 tablet (800 mg total) by mouth every 8 (eight) hours as needed. 30 tablet Sulayman Manning-Warren, Sadie Haber, NP      PDMP not reviewed this encounter.   Abran Cantor, NP 07/08/22 1336

## 2022-07-08 NOTE — Discharge Instructions (Addendum)
Take medication as prescribed.  Take ibuprofen with food and water. Perform the stretching exercises provided. As discussed, recommend the use of ice or heat.  Ice will help with pain and swelling, heat for spasm or stiffness.  Apply for 20 minutes, remove for 1 hour, then repeat. Go to the emergency department immediately if you develop numbness, tingling, inability to walk, or loss of bowel or bladder function. Follow-up with your primary care physician if symptoms do not improve.

## 2022-07-31 ENCOUNTER — Ambulatory Visit (INDEPENDENT_AMBULATORY_CARE_PROVIDER_SITE_OTHER): Payer: Medicaid Other | Admitting: *Deleted

## 2022-07-31 ENCOUNTER — Other Ambulatory Visit (HOSPITAL_COMMUNITY)
Admission: RE | Admit: 2022-07-31 | Discharge: 2022-07-31 | Disposition: A | Discharge status: Home / Self Care | Payer: Medicaid Other | Source: Ambulatory Visit | Attending: Obstetrics & Gynecology | Admitting: Obstetrics & Gynecology

## 2022-07-31 DIAGNOSIS — Z8619 Personal history of other infectious and parasitic diseases: Secondary | ICD-10-CM | POA: Insufficient documentation

## 2022-07-31 DIAGNOSIS — Z09 Encounter for follow-up examination after completed treatment for conditions other than malignant neoplasm: Secondary | ICD-10-CM | POA: Insufficient documentation

## 2022-07-31 DIAGNOSIS — A749 Chlamydial infection, unspecified: Secondary | ICD-10-CM

## 2022-07-31 NOTE — Progress Notes (Signed)
   NURSE VISIT- POC  SUBJECTIVE:  Bailey Palmer is a 26 y.o. [id number] GYN patientfemale here for a vaginal swab for proof of cure after treatment for Chlamydia.  She reports the following symptoms: local irritation. Denies abnormal vaginal bleeding, significant pelvic pain, fever, or UTI symptoms.  OBJECTIVE:  LMP 06/17/2022   Appears well, in no apparent distress  ASSESSMENT: Vaginal swab for proof of cure after treatment for chlamydia  PLAN: Self-collected vaginal probe for Gonorrhea, Chlamydia, Trichomonas, Bacterial Vaginosis, Yeast sent to lab Treatment: to be determined once results are received Follow-up as needed if symptoms persist/worsen, or new symptoms develop  Jobe Marker  07/31/2022 2:37 PM

## 2022-08-04 ENCOUNTER — Other Ambulatory Visit: Payer: Self-pay | Admitting: Obstetrics & Gynecology

## 2022-08-04 DIAGNOSIS — B9689 Other specified bacterial agents as the cause of diseases classified elsewhere: Secondary | ICD-10-CM

## 2022-08-04 LAB — CERVICOVAGINAL ANCILLARY ONLY
Bacterial Vaginitis (gardnerella): POSITIVE — AB
Candida Glabrata: NEGATIVE
Candida Vaginitis: NEGATIVE
Chlamydia: NEGATIVE
Comment: NEGATIVE
Comment: NEGATIVE
Comment: NEGATIVE
Comment: NEGATIVE
Comment: NEGATIVE
Comment: NORMAL
Neisseria Gonorrhea: NEGATIVE
Trichomonas: NEGATIVE

## 2022-08-04 MED ORDER — METRONIDAZOLE 500 MG PO TABS: 500.0000 mg | ORAL_TABLET | Freq: Two times a day (BID) | ORAL | 1 refills | Status: DC

## 2022-08-04 NOTE — Progress Notes (Signed)
Treatment for BV

## 2022-08-20 DIAGNOSIS — Z111 Encounter for screening for respiratory tuberculosis: Secondary | ICD-10-CM | POA: Diagnosis not present

## 2022-08-20 DIAGNOSIS — Z23 Encounter for immunization: Secondary | ICD-10-CM | POA: Diagnosis not present

## 2022-08-27 DIAGNOSIS — Z111 Encounter for screening for respiratory tuberculosis: Secondary | ICD-10-CM | POA: Diagnosis not present

## 2022-09-01 DIAGNOSIS — E669 Obesity, unspecified: Secondary | ICD-10-CM | POA: Diagnosis not present

## 2022-09-01 DIAGNOSIS — Z6828 Body mass index (BMI) 28.0-28.9, adult: Secondary | ICD-10-CM | POA: Diagnosis not present

## 2022-09-01 DIAGNOSIS — D582 Other hemoglobinopathies: Secondary | ICD-10-CM | POA: Diagnosis not present

## 2022-09-01 DIAGNOSIS — E663 Overweight: Secondary | ICD-10-CM | POA: Diagnosis not present

## 2022-09-01 DIAGNOSIS — D509 Iron deficiency anemia, unspecified: Secondary | ICD-10-CM | POA: Diagnosis not present

## 2022-09-01 DIAGNOSIS — L0231 Cutaneous abscess of buttock: Secondary | ICD-10-CM | POA: Diagnosis not present

## 2022-10-13 ENCOUNTER — Other Ambulatory Visit (HOSPITAL_COMMUNITY)
Admission: RE | Admit: 2022-10-13 | Discharge: 2022-10-13 | Disposition: A | Discharge status: Home / Self Care | Payer: Medicaid Other | Source: Ambulatory Visit | Attending: Obstetrics & Gynecology | Admitting: Obstetrics & Gynecology

## 2022-10-13 ENCOUNTER — Other Ambulatory Visit (INDEPENDENT_AMBULATORY_CARE_PROVIDER_SITE_OTHER): Payer: Medicaid Other | Admitting: *Deleted

## 2022-10-13 DIAGNOSIS — Z113 Encounter for screening for infections with a predominantly sexual mode of transmission: Secondary | ICD-10-CM | POA: Diagnosis not present

## 2022-10-13 NOTE — Progress Notes (Signed)
   NURSE VISIT- VAGINITIS/STD/POC  SUBJECTIVE:  Bailey Palmer is a 26 y.o. [id number] GYN patientfemale here for a vaginal swab for STD screen.  She reports the following symptoms: none for 0 days. Denies abnormal vaginal bleeding, significant pelvic pain, fever, or UTI symptoms.  OBJECTIVE:  There were no vitals taken for this visit.  Appears well, in no apparent distress  ASSESSMENT: Vaginal swab for STD screen  PLAN: Self-collected vaginal probe for Gonorrhea, Chlamydia, Trichomonas, Bacterial Vaginosis, Yeast sent to lab Treatment: to be determined once results are received Follow-up as needed if symptoms persist/worsen, or new symptoms develop  Janece Canterbury  10/13/2022 4:04 PM

## 2022-10-15 ENCOUNTER — Encounter: Payer: Self-pay | Admitting: *Deleted

## 2022-10-15 ENCOUNTER — Telehealth: Payer: Self-pay | Admitting: Adult Health

## 2022-10-15 ENCOUNTER — Other Ambulatory Visit: Payer: Self-pay | Admitting: Adult Health

## 2022-10-15 DIAGNOSIS — A749 Chlamydial infection, unspecified: Secondary | ICD-10-CM

## 2022-10-15 LAB — CERVICOVAGINAL ANCILLARY ONLY
Bacterial Vaginitis (gardnerella): NEGATIVE
Candida Glabrata: NEGATIVE
Candida Vaginitis: NEGATIVE
Chlamydia: POSITIVE — AB
Comment: NEGATIVE
Comment: NEGATIVE
Comment: NEGATIVE
Comment: NEGATIVE
Comment: NEGATIVE
Comment: NORMAL
Neisseria Gonorrhea: NEGATIVE
Trichomonas: NEGATIVE

## 2022-10-15 MED ORDER — DOXYCYCLINE HYCLATE 100 MG PO TABS: 100.0000 mg | ORAL_TABLET | Freq: Two times a day (BID) | ORAL | 0 refills | Status: DC

## 2022-10-15 NOTE — Progress Notes (Signed)
+  chlamydia on vaginal swab, rx doxycycline, no sex, POC in 4 weeks and partner needs to be treated. [facility] sent

## 2022-10-15 NOTE — Telephone Encounter (Signed)
Patient has seen lab result and said you could call the prescription in to her pharmacy. Please advise.

## 2022-10-23 ENCOUNTER — Other Ambulatory Visit: Payer: Medicaid Other

## 2022-11-10 ENCOUNTER — Other Ambulatory Visit: Payer: Medicaid Other

## 2022-12-04 ENCOUNTER — Ambulatory Visit (INDEPENDENT_AMBULATORY_CARE_PROVIDER_SITE_OTHER): Payer: Medicaid Other | Admitting: *Deleted

## 2022-12-04 ENCOUNTER — Other Ambulatory Visit: Payer: Self-pay | Admitting: Adult Health

## 2022-12-04 VITALS — BP 134/85 | HR 87 | Ht 63.0 in | Wt 191.0 lb

## 2022-12-04 DIAGNOSIS — N926 Irregular menstruation, unspecified: Secondary | ICD-10-CM

## 2022-12-04 DIAGNOSIS — Z3201 Encounter for pregnancy test, result positive: Secondary | ICD-10-CM | POA: Diagnosis not present

## 2022-12-04 LAB — POCT URINE PREGNANCY: Preg Test, Ur: POSITIVE — AB

## 2022-12-04 MED ORDER — CITRANATAL MEDLEY 27-1-200 MG PO CAPS
1.0000 | ORAL_CAPSULE | Freq: Every day | ORAL | 12 refills | Status: DC
Start: 2022-12-04 — End: 2024-02-11

## 2022-12-04 NOTE — Progress Notes (Signed)
   NURSE VISIT- PREGNANCY CONFIRMATION   SUBJECTIVE:  Bailey Palmer is a 26 y.o. G12P2002 female at [redacted]w[redacted]d by certain LMP of Patient's last menstrual period was 11/02/2022. Here for pregnancy confirmation.  Home pregnancy test: positive x 1   She reports no complaints.  She is not taking prenatal vitamins.    OBJECTIVE:  BP 134/85 (BP Location: Right Arm, Patient Position: Sitting, Cuff Size: Normal)   Pulse 87   Ht 5\' 3"  (1.6 m)   Wt 191 lb (86.6 kg)   LMP 11/02/2022   BMI 33.83 kg/m   Appears well, in no apparent distress  Results for orders placed or performed in visit on 12/04/22 (from the past 24 hour(s))  POCT urine pregnancy   Collection Time: 12/04/22 10:18 AM  Result Value Ref Range   Preg Test, Ur Positive (A) Negative    ASSESSMENT: Positive pregnancy test, [redacted]w[redacted]d by LMP    PLAN: Schedule for dating ultrasound in 3 weeks Prenatal vitamins: note routed to [redacted]w[redacted]d, NP to send prescription, pt requesting small capsules if possible Nausea medicines: not currently needed   OB packet given: Yes  Cyril Mourning  12/04/2022 10:18 AM

## 2022-12-04 NOTE — Progress Notes (Signed)
Rx PNV ?

## 2022-12-08 NOTE — L&D Delivery Note (Addendum)
Delivery Note Bailey Palmer is a 27 y.o. G3P2002 at [redacted]w[redacted]d admitted for SROM.   GBS Status: Negative/-- (08/06 1100)  Labor course: Initial SVE: 3/50/-2. Augmentation with: Pitocin. She then progressed to complete.  ROM: 13h 59m with clear fluid  Birth: At 0604 a viable female was delivered via spontaneous vaginal delivery ROA. Nuchal cord present: No. Shoulders and body delivered in usual fashion. Infant placed directly on mom's abdomen for bonding/skin-to-skin, baby dried and stimulated. Cord clamped x 2 after 1 minute and cut by FOB.  Cord blood collected. The cord evulsed and pitocin was temporarily stopped. The cervix closed and I was unable to retain the placenta. Dr Debroah Loop was  called to the bedside. The placenta was manually removed by Dr Debroah Loop due to cord evulsion. Pitocin infused rapidly IV per protocol post manual removal of placenta. Cytotec 800 mcg given PR. Bladder emptied via I&O with an estimate of . Fundus firm with massage.  Placenta inspected and and appears all placenta was retrieved manually by Dr Debroah Loop from the uterus with a 3 VC.  TXA was given prior to delivery due to starting Hgb of 7.8.  Sponge and instrument count were correct x2. Will give 3 doses of Ancef per Dr. Debroah Loop  Intrapartum complications:  None  Anesthesia:  epidural Episiotomy: none Lacerations:  n/a Suture Repair:  no repair  EBL (mL):    Infant: APGAR (1 MIN):  9 APGAR (5 MINS):  9 APGAR (10 MINS):    Infant weight: pending  Delivery Report: Review the Delivery Report for details.    Mom to postpartum.  Baby to Couplet care / Skin to Skin. Placenta to L&D   Plans to Breast and bottlefeed Contraception: undecided  Note sent to [facility]: FT for pp visit.  Cleda Mccreedy [facility] 07/31/2023 6:40 AM  The above was performed under my direct supervision and guidance.

## 2022-12-10 ENCOUNTER — Telehealth: Payer: Self-pay | Admitting: Advanced Practice Midwife

## 2022-12-10 NOTE — Telephone Encounter (Signed)
Patient is pregnant, hasn't came yet for dating.She is experiencing some spotting, sometimes brownish, sometimes pink. Mild cramping at times. Please advise.

## 2022-12-12 ENCOUNTER — Other Ambulatory Visit (HOSPITAL_COMMUNITY)
Admission: RE | Admit: 2022-12-12 | Discharge: 2022-12-12 | Disposition: A | Discharge status: Home / Self Care | Payer: Medicaid Other | Source: Ambulatory Visit | Attending: Obstetrics & Gynecology | Admitting: Obstetrics & Gynecology

## 2022-12-12 ENCOUNTER — Telehealth: Payer: Self-pay | Admitting: *Deleted

## 2022-12-12 ENCOUNTER — Ambulatory Visit (INDEPENDENT_AMBULATORY_CARE_PROVIDER_SITE_OTHER): Payer: Medicaid Other | Admitting: *Deleted

## 2022-12-12 VITALS — BP 133/89 | HR 97

## 2022-12-12 DIAGNOSIS — N898 Other specified noninflammatory disorders of vagina: Secondary | ICD-10-CM | POA: Insufficient documentation

## 2022-12-12 DIAGNOSIS — Z3A00 Weeks of gestation of pregnancy not specified: Secondary | ICD-10-CM

## 2022-12-12 DIAGNOSIS — O209 Hemorrhage in early pregnancy, unspecified: Secondary | ICD-10-CM | POA: Diagnosis not present

## 2022-12-12 NOTE — Telephone Encounter (Addendum)
Patient called with complaints of continued spotting.  States it is now like a brown discharge along with some vaginal irritation.  Advised to come in for swab and HCG.  Pt verbalized understanding and agreeable to come.

## 2022-12-12 NOTE — Progress Notes (Signed)
   NURSE VISIT- VAGINITIS/STD/POC  SUBJECTIVE:  Bailey Palmer is a 27 y.o. [id number] [redacted]w[redacted]d pregnantfemale here for a vaginal swab for vaginitis screening and HCG.  She reports the following symptoms:  lower abdominal cramping, brown discharge turned to red blood  for 1 week. Denies abnormal vaginal bleeding, significant pelvic pain, fever, or UTI symptoms.  OBJECTIVE:  BP 133/89 (BP Location: Right Arm, Patient Position: Sitting, Cuff Size: Normal)   Pulse 97   LMP 11/02/2022   Appears well, in no apparent distress  ASSESSMENT: Vaginal swab for vaginitis screening HCG  PLAN: Self-collected vaginal probe for Gonorrhea, Chlamydia, Trichomonas, Bacterial Vaginosis, Yeast sent to lab HCG Treatment: to be determined once results are received Follow-up as needed if symptoms persist/worsen, or new symptoms develop  Bailey Palmer  12/12/2022 12:46 PM

## 2022-12-13 LAB — BETA HCG QUANT (REF LAB): hCG Quant: 6415 m[IU]/mL

## 2022-12-15 LAB — CERVICOVAGINAL ANCILLARY ONLY
Bacterial Vaginitis (gardnerella): NEGATIVE
Candida Glabrata: NEGATIVE
Candida Vaginitis: NEGATIVE
Chlamydia: NEGATIVE
Comment: NEGATIVE
Comment: NEGATIVE
Comment: NEGATIVE
Comment: NEGATIVE
Comment: NEGATIVE
Comment: NORMAL
Neisseria Gonorrhea: NEGATIVE
Trichomonas: NEGATIVE

## 2022-12-25 ENCOUNTER — Telehealth: Payer: Self-pay | Admitting: Obstetrics & Gynecology

## 2022-12-25 NOTE — Telephone Encounter (Signed)
Pt is requesting pain medicine for nerve pain. Please contact pt

## 2022-12-29 ENCOUNTER — Other Ambulatory Visit: Payer: Self-pay | Admitting: Obstetrics & Gynecology

## 2022-12-29 DIAGNOSIS — O3680X0 Pregnancy with inconclusive fetal viability, not applicable or unspecified: Secondary | ICD-10-CM

## 2022-12-30 ENCOUNTER — Encounter: Payer: Medicaid Other | Admitting: *Deleted

## 2022-12-30 ENCOUNTER — Ambulatory Visit (INDEPENDENT_AMBULATORY_CARE_PROVIDER_SITE_OTHER): Payer: Medicaid Other

## 2022-12-30 DIAGNOSIS — Z3A08 8 weeks gestation of pregnancy: Secondary | ICD-10-CM

## 2022-12-30 DIAGNOSIS — O3680X0 Pregnancy with inconclusive fetal viability, not applicable or unspecified: Secondary | ICD-10-CM | POA: Diagnosis not present

## 2022-12-30 NOTE — Progress Notes (Signed)
[place] 8+2 wks,single IUP with yolk sac,FHR 173 bpm,normal ovaries,CRL 17.18 mm

## 2023-02-02 ENCOUNTER — Other Ambulatory Visit: Payer: Self-pay | Admitting: Obstetrics & Gynecology

## 2023-02-02 DIAGNOSIS — Z3682 Encounter for antenatal screening for nuchal translucency: Secondary | ICD-10-CM

## 2023-02-03 ENCOUNTER — Encounter: Payer: Medicaid Other | Admitting: *Deleted

## 2023-02-03 ENCOUNTER — Encounter: Payer: Self-pay | Admitting: Women's Health

## 2023-02-03 ENCOUNTER — Ambulatory Visit (INDEPENDENT_AMBULATORY_CARE_PROVIDER_SITE_OTHER): Payer: Medicaid Other

## 2023-02-03 ENCOUNTER — Ambulatory Visit (INDEPENDENT_AMBULATORY_CARE_PROVIDER_SITE_OTHER): Payer: Medicaid Other | Admitting: Women's Health

## 2023-02-03 VITALS — BP 126/74 | HR 111 | Wt 197.0 lb

## 2023-02-03 DIAGNOSIS — Z131 Encounter for screening for diabetes mellitus: Secondary | ICD-10-CM | POA: Diagnosis not present

## 2023-02-03 DIAGNOSIS — Z3481 Encounter for supervision of other normal pregnancy, first trimester: Secondary | ICD-10-CM | POA: Diagnosis not present

## 2023-02-03 DIAGNOSIS — Z3480 Encounter for supervision of other normal pregnancy, unspecified trimester: Secondary | ICD-10-CM

## 2023-02-03 DIAGNOSIS — Z3A13 13 weeks gestation of pregnancy: Secondary | ICD-10-CM | POA: Diagnosis not present

## 2023-02-03 DIAGNOSIS — Z3682 Encounter for antenatal screening for nuchal translucency: Secondary | ICD-10-CM | POA: Diagnosis not present

## 2023-02-03 DIAGNOSIS — Z3490 Encounter for supervision of normal pregnancy, unspecified, unspecified trimester: Secondary | ICD-10-CM | POA: Insufficient documentation

## 2023-02-03 MED ORDER — BLOOD PRESSURE MONITOR MISC: 0 refills | Status: DC

## 2023-02-03 NOTE — Progress Notes (Signed)
INITIAL OBSTETRICAL VISIT Patient name: [name] MRN [id number]  Date of birth: 1996/01/25 Chief Complaint:   Initial Prenatal Visit  History of Present Illness:   Bailey Palmer is a 27 y.o. G92P2002 Hispanic female at 41w2dby LMP c/w u/s at 8 weeks with an Estimated Date of Delivery: 08/09/23 being seen today for her initial obstetrical visit.   Patient's last menstrual period was 11/02/2022. Her obstetrical history is significant for  term uncomplicated SVB x 2, last baby weighed 9lb1.7oz, no dystocia or DM .   Today she reports nausea, declines meds.  Last pap 04/17/21. Results were: NILM w/ HRHPV not done     02/03/2023    2:26 PM 04/17/2021    8:48 AM 02/16/2020   11:26 AM 10/11/2019    1:43 PM 12/16/2017    3:43 PM  Depression screen PHQ 2/9  Decreased Interest 0 0 0 0 0  Down, Depressed, Hopeless 0 0 0 0 0  PHQ - 2 Score 0 0 0 0 0  Altered sleeping 1 1   0  Tired, decreased energy 0 0   0  Change in appetite 0 0   0  Feeling bad or failure about yourself  0 0   0  Trouble concentrating 0 0   0  Moving slowly or fidgety/restless 0 0   0  Suicidal thoughts 0 0   0  PHQ-9 Score 1 1   0  Difficult doing work/chores     Not difficult at all        02/03/2023    2:26 PM 04/17/2021    8:48 AM  GAD 7 : Generalized Anxiety Score  Nervous, Anxious, on Edge 0 0  Control/stop worrying 0 0  Worry too much - different things 0 0  Trouble relaxing 0 0  Restless 0 0  Easily annoyed or irritable 0 0  Afraid - awful might happen 0 0  Total GAD 7 Score 0 0     Review of Systems:   Pertinent items are noted in HPI Denies cramping/contractions, leakage of fluid, vaginal bleeding, abnormal vaginal discharge w/ itching/odor/irritation, headaches, visual changes, shortness of breath, chest pain, abdominal pain, severe nausea/vomiting, or problems with urination or bowel movements unless otherwise stated above.  Pertinent History Reviewed:  Reviewed past medical,surgical,  social, obstetrical and family history.  Reviewed problem list, medications and allergies. OB History  Gravida Para Term Preterm AB Living  '3 2 2     2  '$ SAB IAB Ectopic Multiple Live Births        0 2    # Outcome Date GA Lbr Len/2nd Weight Sex Delivery Anes PTL Lv  3 Current           2 Term 07/04/18 458w0d1:36 / 00:23 9 lb 1.7 oz (4.13 kg) M Vag-Spont EPI N LIV  1 Term 09/17/16 3921w4d:25 / 00:33 8 lb 2 oz (3.685 kg) F Vag-Spont EPI N LIV   Physical Assessment:   Vitals:   02/03/23 1423  BP: 126/74  Pulse: (!) 111  Weight: 197 lb (89.4 kg)  Body mass index is 34.9 kg/m.       Physical Examination:  General appearance - well appearing, and in no distress  Mental status - alert, oriented to person, place, and time  Psych:  She has a normal mood and affect  Skin - warm and dry, normal color, no suspicious lesions noted  Chest - effort normal, all lung  fields clear to auscultation bilaterally  Heart - normal rate and regular rhythm  Abdomen - soft, nontender  Extremities:  No swelling or varicosities noted  Thin prep pap is not done   Chaperone: N/A    TODAY'S NT [place] 13+2 wks,measurements c/w dates,FHR 151 bpm,fundal placenta,normal ovaries,NB present,NT 1.7 mm,CRL 75.31 mm   No results found for this or any previous visit (from the past 24 hour(s)).  Assessment & Plan:  1) Low-Risk Pregnancy G3P2002 at 81w2dwith an Estimated Date of Delivery: 08/09/23   2) Initial OB visit  3) H/O macrosomic infant>9lb1.7oz baby, no shoulder dystocia or DM  Meds:  Meds ordered this encounter  Medications   Blood Pressure Monitor MISC    Sig: For regular home bp monitoring during pregnancy    Dispense:  1 each    Refill:  0    Z34.81 Please mail to patient    Initial labs obtained Continue prenatal vitamins Reviewed n/v relief measures and warning s/s to report Reviewed recommended weight gain based on pre-gravid BMI Encouraged well-balanced diet Genetic & carrier screening  discussed: requests Panorama and NT/IT, declines AFP and Horizon   Ultrasound discussed; fetal survey: requested [place] form faxed if has or is planning to apply for medicaid The nature of [employer] multiple MDs and other Advanced Practice Providers was explained to patient; also emphasized that fellows, residents, and students are part of our team. Does not have home bp cuff. Office bp cuff given: no. Rx sent: yes. Check bp weekly, let u[place]oreaknow if consistently >140/90.   Indications for ASA therapy (per uptodate) OR Two or more of the following: Obesity (BMI>30 kg/m2) Yes No other indication  Indications for early A1C (per uptodate) BMI >=25 (>=23 in Asian women) AND one of the following High-risk race/ethnicity (eg, African American, Latino, Native American, [place], [place] Yes H/O fetal macrosomia  Follow-up: Return in about 4 weeks (around 03/03/2023) for LROB, 2nd IT, CNM, in person; then 7wks from now for anatomy u/s, LROB w/ CNM.   Orders Placed This Encounter  Procedures   Urine Culture   GC/Chlamydia Probe Amp   CBC/D/Plt+RPR+Rh+ABO+RubIgG...   PANORAMA PRENATAL TEST FULL PANEL   Integrated 1   Hemoglobin A1c    [facility]2/27/2024 2:58 PM

## 2023-02-03 NOTE — Patient Instructions (Signed)
Bailey Palmer, thank you for choosing our office today! We appreciate the opportunity to meet your healthcare needs. You may receive a short survey by mail, e-mail, or through [employer]. If you are happy with your care we would appreciate if you could take just a few minutes to complete the survey questions. We read all of your comments and take your feedback very seriously. Thank you again for choosing our office.  [facility] at [place] at [facility] ([place], [place] [address]) Entrance C, located off of [address] parking   Nausea & Vomiting Have saltine crackers or pretzels by your bed and eat a few bites before you raise your head out of bed in the morning Eat small frequent meals throughout the day instead of large meals Drink plenty of fluids throughout the day to stay hydrated, just don't drink a lot of fluids with your meals.  This can make your stomach fill up faster making you feel sick Do not brush your teeth right after you eat Products with real ginger are good for nausea, like ginger ale and ginger hard candy Make sure it says made with real ginger! Sucking on sour candy like lemon heads is also good for nausea If your prenatal vitamins make you nauseated, take them at night so you will sleep through the nausea Sea Bands If you feel like you need medicine for the nausea & vomiting please let us know If you are unable to keep any fluids or food down please let us know   Constipation Drink plenty of fluid, preferably water, throughout the day Eat foods high in fiber such as fruits, vegetables, and grains Exercise, such as walking, is a good way to keep your bowels regular Drink warm fluids, especially warm prune juice, or decaf coffee Eat a 1/2 cup of real oatmeal (not instant), 1/2 cup applesauce, and 1/2-1 cup warm prune juice every day If needed, you may take Colace (docusate sodium) stool  softener once or twice a day to help keep the stool soft.  If you still are having problems with constipation, you may take Miralax once daily as needed to help keep your bowels regular.   Home Blood Pressure Monitoring for Patients   Your provider has recommended that you check your blood pressure (BP) at least once a week at home. If you do not have a blood pressure cuff at home, one will be provided for you. Contact your provider if you have not received your monitor within 1 week.   Helpful Tips for Accurate Home Blood Pressure Checks  Don't smoke, exercise, or drink caffeine 30 minutes before checking your BP Use the restroom before checking your BP (a full bladder can raise your pressure) Relax in a comfortable upright chair Feet on the ground Left arm resting comfortably on a flat surface at the level of your heart Legs uncrossed Back supported Sit quietly and don't talk Place the cuff on your bare arm Adjust snuggly, so that only two fingertips can fit between your skin and the top of the cuff Check 2 readings separated by at least one minute Keep a log of your BP readings For a visual, please reference this diagram: http://ccnc.care/bpdiagram  Provider Name: [facility]     Phone: [phone]  Zone 1: ALL CLEAR  Continue to monitor your symptoms:  BP reading is less than 140 (top number) or less than 90 (bottom  number)  No right upper stomach pain No headaches or seeing spots No feeling nauseated or throwing up No swelling in face and hands  Zone 2: CAUTION Call your doctor's office for any of the following:  BP reading is greater than 140 (top number) or greater than 90 (bottom number)  Stomach pain under your ribs in the middle or right side Headaches or seeing spots Feeling nauseated or throwing up Swelling in face and hands  Zone 3: EMERGENCY  Seek immediate medical care if you have any of the following:  BP reading is greater than160 (top number) or  greater than 110 (bottom number) Severe headaches not improving with Tylenol Serious difficulty catching your breath Any worsening symptoms from Zone 2    First Trimester of Pregnancy The first trimester of pregnancy is from week 1 until the end of week 12 (months 1 through 3). A week after a sperm fertilizes an egg, the egg will implant on the wall of the uterus. This embryo will begin to develop into a baby. Genes from you and your partner are forming the baby. The female genes determine whether the baby is a boy or a girl. At 6-8 weeks, the eyes and face are formed, and the heartbeat can be seen on ultrasound. At the end of 12 weeks, all the baby's organs are formed.  Now that you are pregnant, you will want to do everything you can to have a healthy baby. Two of the most important things are to get good prenatal care and to follow your health care provider's instructions. Prenatal care is all the medical care you receive before the baby's birth. This care will help prevent, find, and treat any problems during the pregnancy and childbirth. BODY CHANGES Your body goes through many changes during pregnancy. The changes vary from woman to woman.  You may gain or lose a couple of pounds at first. You may feel sick to your stomach (nauseous) and throw up (vomit). If the vomiting is uncontrollable, call your health care provider. You may tire easily. You may develop headaches that can be relieved by medicines approved by your health care provider. You may urinate more often. Painful urination may mean you have a bladder infection. You may develop heartburn as a result of your pregnancy. You may develop constipation because certain hormones are causing the muscles that push waste through your intestines to slow down. You may develop hemorrhoids or swollen, bulging veins (varicose veins). Your breasts may begin to grow larger and become tender. Your nipples may stick out more, and the tissue that  surrounds them (areola) may become darker. Your gums may bleed and may be sensitive to brushing and flossing. Dark spots or blotches (chloasma, mask of pregnancy) may develop on your face. This will likely fade after the baby is born. Your menstrual periods will stop. You may have a loss of appetite. You may develop cravings for certain kinds of food. You may have changes in your emotions from day to day, such as being excited to be pregnant or being concerned that something may go wrong with the pregnancy and baby. You may have more vivid and strange dreams. You may have changes in your hair. These can include thickening of your hair, rapid growth, and changes in texture. Some women also have hair loss during or after pregnancy, or hair that feels dry or thin. Your hair will most likely return to normal after your baby is born. WHAT TO EXPECT AT YOUR PRENATAL  VISITS During a routine prenatal visit: You will be weighed to make sure you and the baby are growing normally. Your blood pressure will be taken. Your abdomen will be measured to track your baby's growth. The fetal heartbeat will be listened to starting around week 10 or 12 of your pregnancy. Test results from any previous visits will be discussed. Your health care provider may ask you: How you are feeling. If you are feeling the baby move. If you have had any abnormal symptoms, such as leaking fluid, bleeding, severe headaches, or abdominal cramping. If you have any questions. Other tests that may be performed during your first trimester include: Blood tests to find your blood type and to check for the presence of any previous infections. They will also be used to check for low iron levels (anemia) and Rh antibodies. Later in the pregnancy, blood tests for diabetes will be done along with other tests if problems develop. Urine tests to check for infections, diabetes, or protein in the urine. An ultrasound to confirm the proper growth  and development of the baby. An amniocentesis to check for possible genetic problems. Fetal screens for spina bifida and Down syndrome. You may need other tests to make sure you and the baby are doing well. HOME CARE INSTRUCTIONS  Medicines Follow your health care provider's instructions regarding medicine use. Specific medicines may be either safe or unsafe to take during pregnancy. Take your prenatal vitamins as directed. If you develop constipation, try taking a stool softener if your health care provider approves. Diet Eat regular, well-balanced meals. Choose a variety of foods, such as meat or vegetable-based protein, fish, milk and low-fat dairy products, vegetables, fruits, and whole grain breads and cereals. Your health care provider will help you determine the amount of weight gain that is right for you. Avoid raw meat and uncooked cheese. These carry germs that can cause birth defects in the baby. Eating four or five small meals rather than three large meals a day may help relieve nausea and vomiting. If you start to feel nauseous, eating a few soda crackers can be helpful. Drinking liquids between meals instead of during meals also seems to help nausea and vomiting. If you develop constipation, eat more high-fiber foods, such as fresh vegetables or fruit and whole grains. Drink enough fluids to keep your urine clear or pale yellow. Activity and Exercise Exercise only as directed by your health care provider. Exercising will help you: Control your weight. Stay in shape. Be prepared for labor and delivery. Experiencing pain or cramping in the lower abdomen or low back is a good sign that you should stop exercising. Check with your health care provider before continuing normal exercises. Try to avoid standing for long periods of time. Move your legs often if you must stand in one place for a long time. Avoid heavy lifting. Wear low-heeled shoes, and practice good posture. You may  continue to have sex unless your health care provider directs you otherwise. Relief of Pain or Discomfort Wear a good support bra for breast tenderness.   Take warm sitz baths to soothe any pain or discomfort caused by hemorrhoids. Use hemorrhoid cream if your health care provider approves.   Rest with your legs elevated if you have leg cramps or low back pain. If you develop varicose veins in your legs, wear support hose. Elevate your feet for 15 minutes, 3-4 times a day. Limit salt in your diet. Prenatal Care Schedule your prenatal visits by the  twelfth week of pregnancy. They are usually scheduled monthly at first, then more often in the last 2 months before delivery. Write down your questions. Take them to your prenatal visits. Keep all your prenatal visits as directed by your health care provider. Safety Wear your seat belt at all times when driving. Make a list of emergency phone numbers, including numbers for family, friends, the hospital, and police and fire departments. General Tips Ask your health care provider for a referral to a local prenatal education class. Begin classes no later than at the beginning of month 6 of your pregnancy. Ask for help if you have counseling or nutritional needs during pregnancy. Your health care provider can offer advice or refer you to specialists for help with various needs. Do not use hot tubs, steam rooms, or saunas. Do not douche or use tampons or scented sanitary pads. Do not cross your legs for long periods of time. Avoid cat litter boxes and soil used by cats. These carry germs that can cause birth defects in the baby and possibly loss of the fetus by miscarriage or stillbirth. Avoid all smoking, herbs, alcohol, and medicines not prescribed by your health care provider. Chemicals in these affect the formation and growth of the baby. Schedule a dentist appointment. At home, brush your teeth with a soft toothbrush and be gentle when you floss. SEEK  MEDICAL CARE IF:  You have dizziness. You have mild pelvic cramps, pelvic pressure, or nagging pain in the abdominal area. You have persistent nausea, vomiting, or diarrhea. You have a bad smelling vaginal discharge. You have pain with urination. You notice increased swelling in your face, hands, legs, or ankles. SEEK IMMEDIATE MEDICAL CARE IF:  You have a fever. You are leaking fluid from your vagina. You have spotting or bleeding from your vagina. You have severe abdominal cramping or pain. You have rapid weight gain or loss. You vomit blood or material that looks like coffee grounds. You are exposed to [place] measles and have never had them. You are exposed to fifth disease or chickenpox. You develop a severe headache. You have shortness of breath. You have any kind of trauma, such as from a fall or a car accident. Document Released: 11/18/2001 Document Revised: 04/10/2014 Document Reviewed: 10/04/2013 [facility] Patient Information 2015 [place], [place]. This information is not intended to replace advice given to you by your health care provider. Make sure you discuss any questions you have with your health care provider.

## 2023-02-03 NOTE — Progress Notes (Signed)
[place] 13+2 wks,measurements c/w dates,FHR 151 bpm,fundal placenta,normal ovaries,NB present,NT 1.7 mm,CRL 75.31 mm

## 2023-02-04 LAB — INTEGRATED 1

## 2023-02-05 ENCOUNTER — Encounter: Payer: Self-pay | Admitting: Women's Health

## 2023-02-05 LAB — INTEGRATED 1
Crown Rump Length: 75.3 mm
Gest. Age on Collection Date: 13.4 weeks
Maternal Age at EDD: 26.6 yr
Nuchal Translucency (NT): 1.7 mm
Number of Fetuses: 1
PAPP-A Value: 1037.4 ng/mL
Weight: 197 [lb_av]

## 2023-02-05 LAB — CBC/D/PLT+RPR+RH+ABO+RUBIGG...
Antibody Screen: NEGATIVE
Basophils Absolute: 0 10*3/uL (ref 0.0–0.2)
Basos: 0 %
EOS (ABSOLUTE): 0.1 10*3/uL (ref 0.0–0.4)
Eos: 1 %
HCV Ab: NONREACTIVE
HIV Screen 4th Generation wRfx: NONREACTIVE
Hematocrit: 39 % (ref 34.0–46.6)
Hemoglobin: 13.2 g/dL (ref 11.1–15.9)
Hepatitis B Surface Ag: NEGATIVE
Immature Grans (Abs): 0 10*3/uL (ref 0.0–0.1)
Immature Granulocytes: 0 %
Lymphocytes Absolute: 2.3 10*3/uL (ref 0.7–3.1)
Lymphs: 21 %
MCH: 28.6 pg (ref 26.6–33.0)
MCHC: 33.8 g/dL (ref 31.5–35.7)
MCV: 84 fL (ref 79–97)
Monocytes Absolute: 0.6 10*3/uL (ref 0.1–0.9)
Monocytes: 5 %
Neutrophils Absolute: 8 10*3/uL — ABNORMAL HIGH (ref 1.4–7.0)
Neutrophils: 73 %
Platelets: 324 10*3/uL (ref 150–450)
RBC: 4.62 x10E6/uL (ref 3.77–5.28)
RDW: 13.9 % (ref 11.7–15.4)
RPR Ser Ql: NONREACTIVE
Rh Factor: POSITIVE
Rubella Antibodies, IGG: 1.19 index (ref >0.99)
WBC: 11 10*3/uL — ABNORMAL HIGH (ref 3.4–10.8)

## 2023-02-05 LAB — HEMOGLOBIN A1C
Est. average glucose Bld gHb Est-mCnc: 103 mg/dL
Hgb A1c MFr Bld: 5.2 % (ref 4.8–5.6)

## 2023-02-05 LAB — URINE CULTURE: Organism ID, Bacteria: NO GROWTH

## 2023-02-05 LAB — HCV INTERPRETATION

## 2023-02-06 LAB — GC/CHLAMYDIA PROBE AMP
Chlamydia trachomatis, NAA: NEGATIVE
Neisseria Gonorrhoeae by PCR: NEGATIVE

## 2023-02-10 LAB — PANORAMA PRENATAL TEST FULL PANEL:PANORAMA TEST PLUS 5 ADDITIONAL MICRODELETIONS: FETAL FRACTION: 6

## 2023-03-03 ENCOUNTER — Encounter: Payer: Medicaid Other | Admitting: Women's Health

## 2023-03-10 ENCOUNTER — Ambulatory Visit (INDEPENDENT_AMBULATORY_CARE_PROVIDER_SITE_OTHER): Payer: Medicaid Other | Admitting: Women's Health

## 2023-03-10 ENCOUNTER — Encounter: Payer: Self-pay | Admitting: Women's Health

## 2023-03-10 ENCOUNTER — Encounter: Payer: Medicaid Other | Admitting: Women's Health

## 2023-03-10 VITALS — BP 110/72 | HR 94 | Wt 200.0 lb

## 2023-03-10 DIAGNOSIS — Z1379 Encounter for other screening for genetic and chromosomal anomalies: Secondary | ICD-10-CM

## 2023-03-10 DIAGNOSIS — Z363 Encounter for antenatal screening for malformations: Secondary | ICD-10-CM

## 2023-03-10 DIAGNOSIS — Z3A18 18 weeks gestation of pregnancy: Secondary | ICD-10-CM

## 2023-03-10 DIAGNOSIS — Z3480 Encounter for supervision of other normal pregnancy, unspecified trimester: Secondary | ICD-10-CM

## 2023-03-10 DIAGNOSIS — Z3482 Encounter for supervision of other normal pregnancy, second trimester: Secondary | ICD-10-CM

## 2023-03-10 NOTE — Patient Instructions (Signed)
Bailey Palmer, thank you for choosing our office today! We appreciate the opportunity to meet your healthcare needs. You may receive a short survey by mail, e-mail, or through [employer]. If you are happy with your care we would appreciate if you could take just a few minutes to complete the survey questions. We read all of your comments and take your feedback very seriously. Thank you again for choosing our office.  [facility] [employer] Team at [place] at [facility] ([place], [place] [address]) Entrance C, located off of [address] parking  Go to [email or web address] to register for FREE online childbirth classes  Call the office [phone]) or go to [facility] if: You begin to severe cramping Your water breaks.  Sometimes it is a big gush of fluid, sometimes it is just a trickle that keeps getting your panties wet or running down your legs You have vaginal bleeding.  It is normal to have a small amount of spotting if your cervix was checked.   [facility] Pediatricians/Family Doctors [facility]): [address] [place], [place] [facility]: [address] [place], [phone]                [place] [facility]): [place], [phone] (call to ask if accepting patients) [facility] Department: [address], [place], [place] Pediatricians/Family Doctors [facility]): [place]. [place], [address], [place] [facility]: [address] [place], [place] [facility] [place]: [place], [place] [facility]): [phone] [facility]: [address] [place]: [address] [place] [facility]: [phone], [phone]  Home Blood Pressure Monitoring for Patients   Your provider has recommended that you check your blood pressure (BP) at least once a week at home. If you do not have a blood pressure cuff at home, one will be provided for you. Contact your provider if you have not received your monitor within 1 week.   Helpful Tips for Accurate Home Blood Pressure Checks  Don't smoke, exercise, or drink caffeine 30 minutes before checking your BP Use the restroom before checking your BP (a full bladder can raise your pressure) Relax in a comfortable upright chair Feet on the ground Left arm resting comfortably on a flat surface at the level of your heart Legs uncrossed Back supported Sit quietly and don't talk Place the cuff on your bare arm Adjust snuggly, so that only two fingertips can fit between your skin and the top of the cuff Check 2 readings separated by at least one minute Keep a log of your BP readings For a visual, please reference this diagram: http://ccnc.care/bpdiagram  Provider Name: [facility]     Phone: [phone]  Zone 1: ALL CLEAR  Continue to monitor your symptoms:  BP reading is less than 140 (top number) or less than 90 (bottom number)  No right upper stomach pain No headaches or seeing spots No feeling nauseated or throwing up No swelling in face and hands  Zone 2: CAUTION Call your doctor's office for any of the following:  BP reading is greater than 140 (top number) or greater than  90 (bottom number)  Stomach pain under your ribs in the middle or right side Headaches or seeing spots Feeling nauseated or throwing up Swelling in face and hands  Zone 3: EMERGENCY  Seek immediate medical care if you have any of the following:  BP reading is greater than160 (top number) or greater than 110 (bottom number) Severe headaches not improving with Tylenol Serious difficulty catching your breath Any worsening symptoms from  Zone 2     Second Trimester of Pregnancy The second trimester is from week 14 through week 27 (months 4 through 6). The second trimester is often a time when you feel your best. Your body has adjusted to being pregnant, and you begin to feel better physically. Usually, morning sickness has lessened or quit completely, you may have more energy, and you may have an increase in appetite. The second trimester is also a time when the fetus is growing rapidly. At the end of the sixth month, the fetus is about 9 inches long and weighs about 1 pounds. You will likely begin to feel the baby move (quickening) between 16 and 20 weeks of pregnancy. Body changes during your second trimester Your body continues to go through many changes during your second trimester. The changes vary from woman to woman. Your weight will continue to increase. You will notice your lower abdomen bulging out. You may begin to get stretch marks on your hips, abdomen, and breasts. You may develop headaches that can be relieved by medicines. The medicines should be approved by your health care provider. You may urinate more often because the fetus is pressing on your bladder. You may develop or continue to have heartburn as a result of your pregnancy. You may develop constipation because certain hormones are causing the muscles that push waste through your intestines to slow down. You may develop hemorrhoids or swollen, bulging veins (varicose veins). You may have back pain. This is caused by: Weight gain. Pregnancy hormones that are relaxing the joints in your pelvis. A shift in weight and the muscles that support your balance. Your breasts will continue to grow and they will continue to become tender. Your gums may bleed and may be sensitive to brushing and flossing. Dark spots or blotches (chloasma, mask of pregnancy) may develop on your face. This will likely fade after the baby is born. A dark line from your belly button to  the pubic area (linea nigra) may appear. This will likely fade after the baby is born. You may have changes in your hair. These can include thickening of your hair, rapid growth, and changes in texture. Some women also have hair loss during or after pregnancy, or hair that feels dry or thin. Your hair will most likely return to normal after your baby is born.  What to expect at prenatal visits During a routine prenatal visit: You will be weighed to make sure you and the fetus are growing normally. Your blood pressure will be taken. Your abdomen will be measured to track your baby's growth. The fetal heartbeat will be listened to. Any test results from the previous visit will be discussed.  Your health care provider may ask you: How you are feeling. If you are feeling the baby move. If you have had any abnormal symptoms, such as leaking fluid, bleeding, severe headaches, or abdominal cramping. If you are using any tobacco products, including cigarettes, chewing tobacco, and electronic cigarettes. If you have any questions.  Other tests that may be performed during   your second trimester include: Blood tests that check for: Low iron levels (anemia). High blood sugar that affects pregnant women (gestational diabetes) between 24 and 28 weeks. Rh antibodies. This is to check for a protein on red blood cells (Rh factor). Urine tests to check for infections, diabetes, or protein in the urine. An ultrasound to confirm the proper growth and development of the baby. An amniocentesis to check for possible genetic problems. Fetal screens for spina bifida and Down syndrome. HIV (human immunodeficiency virus) testing. Routine prenatal testing includes screening for HIV, unless you choose not to have this test.  Follow these instructions at home: Medicines Follow your health care provider's instructions regarding medicine use. Specific medicines may be either safe or unsafe to take during  pregnancy. Take a prenatal vitamin that contains at least 600 micrograms (mcg) of folic acid. If you develop constipation, try taking a stool softener if your health care provider approves. Eating and drinking Eat a balanced diet that includes fresh fruits and vegetables, whole grains, good sources of protein such as meat, eggs, or tofu, and low-fat dairy. Your health care provider will help you determine the amount of weight gain that is right for you. Avoid raw meat and uncooked cheese. These carry germs that can cause birth defects in the baby. If you have low calcium intake from food, talk to your health care provider about whether you should take a daily calcium supplement. Limit foods that are high in fat and processed sugars, such as fried and sweet foods. To prevent constipation: Drink enough fluid to keep your urine clear or pale yellow. Eat foods that are high in fiber, such as fresh fruits and vegetables, whole grains, and beans. Activity Exercise only as directed by your health care provider. Most women can continue their usual exercise routine during pregnancy. Try to exercise for 30 minutes at least 5 days a week. Stop exercising if you experience uterine contractions. Avoid heavy lifting, wear low heel shoes, and practice good posture. A sexual relationship may be continued unless your health care provider directs you otherwise. Relieving pain and discomfort Wear a good support bra to prevent discomfort from breast tenderness. Take warm sitz baths to soothe any pain or discomfort caused by hemorrhoids. Use hemorrhoid cream if your health care provider approves. Rest with your legs elevated if you have leg cramps or low back pain. If you develop varicose veins, wear support hose. Elevate your feet for 15 minutes, 3-4 times a day. Limit salt in your diet. Prenatal Care Write down your questions. Take them to your prenatal visits. Keep all your prenatal visits as told by your health  care provider. This is important. Safety Wear your seat belt at all times when driving. Make a list of emergency phone numbers, including numbers for family, friends, the hospital, and police and fire departments. General instructions Ask your health care provider for a referral to a local prenatal education class. Begin classes no later than the beginning of month 6 of your pregnancy. Ask for help if you have counseling or nutritional needs during pregnancy. Your health care provider can offer advice or refer you to specialists for help with various needs. Do not use hot tubs, steam rooms, or saunas. Do not douche or use tampons or scented sanitary pads. Do not cross your legs for long periods of time. Avoid cat litter boxes and soil used by cats. These carry germs that can cause birth defects in the baby and possibly loss of the   fetus by miscarriage or stillbirth. Avoid all smoking, herbs, alcohol, and unprescribed drugs. Chemicals in these products can affect the formation and growth of the baby. Do not use any products that contain nicotine or tobacco, such as cigarettes and e-cigarettes. If you need help quitting, ask your health care provider. Visit your dentist if you have not gone yet during your pregnancy. Use a soft toothbrush to brush your teeth and be gentle when you floss. Contact a health care provider if: You have dizziness. You have mild pelvic cramps, pelvic pressure, or nagging pain in the abdominal area. You have persistent nausea, vomiting, or diarrhea. You have a bad smelling vaginal discharge. You have pain when you urinate. Get help right away if: You have a fever. You are leaking fluid from your vagina. You have spotting or bleeding from your vagina. You have severe abdominal cramping or pain. You have rapid weight gain or weight loss. You have shortness of breath with chest pain. You notice sudden or extreme swelling of your face, hands, ankles, feet, or legs. You  have not felt your baby move in over an hour. You have severe headaches that do not go away when you take medicine. You have vision changes. Summary The second trimester is from week 14 through week 27 (months 4 through 6). It is also a time when the fetus is growing rapidly. Your body goes through many changes during pregnancy. The changes vary from woman to woman. Avoid all smoking, herbs, alcohol, and unprescribed drugs. These chemicals affect the formation and growth your baby. Do not use any tobacco products, such as cigarettes, chewing tobacco, and e-cigarettes. If you need help quitting, ask your health care provider. Contact your health care provider if you have any questions. Keep all prenatal visits as told by your health care provider. This is important. This information is not intended to replace advice given to you by your health care provider. Make sure you discuss any questions you have with your health care provider. Document Released: 11/18/2001 Document Revised: 05/01/2016 Document Reviewed: 01/25/2013 Elsevier Interactive Patient Education  2017 Elsevier Inc.  

## 2023-03-10 NOTE — Progress Notes (Signed)
LOW-RISK PREGNANCY VISIT Patient name: Bailey Palmer MRN [id number]  Date of birth: 09/03/1996 Chief Complaint:   Routine Prenatal Visit (2nd IT)  History of Present Illness:   Bailey Palmer is a 27 y.o. G91P2002 female at [redacted]w[redacted]d with an Estimated Date of Delivery: 08/09/23 being seen today for ongoing management of a low-risk pregnancy.   Today she reports no complaints. Contractions: Not present.  .  Movement: Absent. denies leaking of fluid.     02/03/2023    2:26 PM 04/17/2021    8:48 AM 02/16/2020   11:26 AM 10/11/2019    1:43 PM 12/16/2017    3:43 PM  Depression screen PHQ 2/9  Decreased Interest 0 0 0 0 0  Down, Depressed, Hopeless 0 0 0 0 0  PHQ - 2 Score 0 0 0 0 0  Altered sleeping 1 1   0  Tired, decreased energy 0 0   0  Change in appetite 0 0   0  Feeling bad or failure about yourself  0 0   0  Trouble concentrating 0 0   0  Moving slowly or fidgety/restless 0 0   0  Suicidal thoughts 0 0   0  PHQ-9 Score 1 1   0  Difficult doing work/chores     Not difficult at all        02/03/2023    2:26 PM 04/17/2021    8:48 AM  GAD 7 : Generalized Anxiety Score  Nervous, Anxious, on Edge 0 0  Control/stop worrying 0 0  Worry too much - different things 0 0  Trouble relaxing 0 0  Restless 0 0  Easily annoyed or irritable 0 0  Afraid - awful might happen 0 0  Total GAD 7 Score 0 0      Review of Systems:   Pertinent items are noted in HPI Denies abnormal vaginal discharge w/ itching/odor/irritation, headaches, visual changes, shortness of breath, chest pain, abdominal pain, severe nausea/vomiting, or problems with urination or bowel movements unless otherwise stated above. Pertinent History Reviewed:  Reviewed past medical,surgical, social, obstetrical and family history.  Reviewed problem list, medications and allergies. Physical Assessment:   Vitals:   03/10/23 1541  BP: 110/72  Pulse: 94  Weight: 200 lb (90.7 kg)  Body mass index is 35.43 kg/m.         Physical Examination:   General appearance: Well appearing, and in no distress  Mental status: Alert, oriented to person, place, and time  Skin: Warm & dry  Cardiovascular: Normal heart rate noted  Respiratory: Normal respiratory effort, no distress  Abdomen: Soft, gravid, nontender  Pelvic: Cervical exam deferred         Extremities: Edema: None  Fetal Status: Fetal Heart Rate (bpm): 145   Movement: Absent    Chaperone: N/A   No results found for this or any previous visit (from the past 24 hour(s)).  Assessment & Plan:  1) Low-risk pregnancy G3P2002 at [redacted]w[redacted]d with an Estimated Date of Delivery: 08/09/23    Meds: No orders of the defined types were placed in this encounter.  Labs/procedures today: 2nd IT  Plan:  Continue routine obstetrical care  Next visit: prefers will be in person for anatomy u/s     Reviewed: Preterm labor symptoms and general obstetric precautions including but not limited to vaginal bleeding, contractions, leaking of fluid and fetal movement were reviewed in detail with the patient.  All questions were answered. Does have home bp  cuff. Office bp cuff given: not applicable. Check bp weekly, let us know if consistently >140 and/or >90.  Follow-up: Return for As scheduled.  Future Appointments  Date Time Provider [place]  03/24/2023  2:15 PM [facility] [place] [facility] None  03/24/2023  3:10 PM Roma Schanz, CNM [facility]    Orders Placed This Encounter  Procedures   US OB Comp + 14 Wk   INTEGRATED 2   Roma Schanz CNM, [facility] 03/10/2023 4:15 PM

## 2023-03-12 LAB — INTEGRATED 2
AFP MoM: 1.71
Alpha-Fetoprotein: 61.8 ng/mL
Crown Rump Length: 75.3 mm
DIA MoM: 3.07
DIA Value: 410.6 pg/mL
Estriol, Unconjugated: 1.5 ng/mL
Gest. Age on Collection Date: 13.4 weeks
Gestational Age: 18.4 weeks
Maternal Age at EDD: 26.6 yr
Nuchal Translucency (NT): 1.7 mm
Nuchal Translucency MoM: 0.98
Number of Fetuses: 1
PAPP-A MoM: 1.02
PAPP-A Value: 1037.4 ng/mL
Test Results:: NEGATIVE
Weight: 197 [lb_av]
Weight: 197 [lb_av]
hCG MoM: 1.86
hCG Value: 39.2 IU/mL
uE3 MoM: 1.01

## 2023-03-24 ENCOUNTER — Ambulatory Visit (INDEPENDENT_AMBULATORY_CARE_PROVIDER_SITE_OTHER): Payer: Medicaid Other

## 2023-03-24 ENCOUNTER — Encounter: Payer: Self-pay | Admitting: Women's Health

## 2023-03-24 ENCOUNTER — Encounter: Payer: Self-pay | Admitting: *Deleted

## 2023-03-24 ENCOUNTER — Ambulatory Visit (INDEPENDENT_AMBULATORY_CARE_PROVIDER_SITE_OTHER): Payer: Medicaid Other | Admitting: Women's Health

## 2023-03-24 VITALS — BP 131/75 | HR 109 | Wt 198.4 lb

## 2023-03-24 DIAGNOSIS — R300 Dysuria: Secondary | ICD-10-CM

## 2023-03-24 DIAGNOSIS — Z3480 Encounter for supervision of other normal pregnancy, unspecified trimester: Secondary | ICD-10-CM

## 2023-03-24 DIAGNOSIS — Z3A20 20 weeks gestation of pregnancy: Secondary | ICD-10-CM

## 2023-03-24 DIAGNOSIS — Z3482 Encounter for supervision of other normal pregnancy, second trimester: Secondary | ICD-10-CM | POA: Diagnosis not present

## 2023-03-24 DIAGNOSIS — Z363 Encounter for antenatal screening for malformations: Secondary | ICD-10-CM

## 2023-03-24 LAB — POCT URINALYSIS DIPSTICK OB
Blood, UA: NEGATIVE
Glucose, UA: NEGATIVE
Nitrite, UA: NEGATIVE
POC,PROTEIN,UA: NEGATIVE

## 2023-03-24 MED ORDER — NITROFURANTOIN MONOHYD MACRO 100 MG PO CAPS: 100.0000 mg | ORAL_CAPSULE | Freq: Two times a day (BID) | ORAL | 0 refills | Status: DC

## 2023-03-24 NOTE — Addendum Note (Signed)
Addended by: Annamarie Dawley on: 03/24/2023 04:16 PM   Modules accepted: Orders

## 2023-03-24 NOTE — Progress Notes (Signed)
[place] 20+2 wks,breech,fundal right lateral placenta gr 0,normal ovaries,cx 4.5 cm,FHR 154 bpm,SVP of fluid 5 cm,EFW 363 g 60 %,anatomy complete,no obvious abnormalities

## 2023-03-24 NOTE — Patient Instructions (Signed)
Bailey Palmer, thank you for choosing our office today! We appreciate the opportunity to meet your healthcare needs. You may receive a short survey by mail, e-mail, or through MyChart. If you are happy with your care we would appreciate if you could take just a few minutes to complete the survey questions. We read all of your comments and take your feedback very seriously. Thank you again for choosing our office.  [facility] Team at [facility] ([address] [place], [place] [address]) Entrance C, located off of [address] Free 24/7 valet parking  Go to [email or web address] to register for FREE online childbirth classes  Call the office ([phone]) or go to [facility] if: You begin to severe cramping Your water breaks.  Sometimes it is a big gush of fluid, sometimes it is just a trickle that keeps getting your panties wet or running down your legs You have vaginal bleeding.  It is normal to have a small amount of spotting if your cervix was checked.   [place] [facility] ([facility]): [address], [phone]           [facility]: [address], [phone]                [facility] ([facility]): [address], [phone] (call to ask if accepting patients) [facility]: [address], [place], [phone]    [place] [facility]/Family Doctors [facility] ([facility]): [address], [phone] [facility]: [address], [phone] [facility]: [address], [phone]  [place] [facility]  [facility] ([facility]): [phone] [facility]: [address], [phone]   [place] [facility]: [address], [phone]  [place] [facility]  [facility]: [address], [phone]  Home Blood Pressure Monitoring for Patients   Your provider has recommended that you check your blood pressure (BP) at least once a week at home. If you do not have a blood pressure cuff at home, one will be provided for you. Contact your provider if you have not received your monitor within 1 week.   Helpful Tips for Accurate Home Blood Pressure Checks  Don't smoke, exercise, or drink caffeine 30 minutes before checking your BP Use the restroom before checking your BP (a full bladder can raise your pressure) Relax in a comfortable upright chair Feet on the ground Left arm resting comfortably on a flat surface at the level of your heart Legs uncrossed Back supported Sit quietly and don't talk Place the cuff on your bare arm Adjust snuggly, so that only two fingertips can fit between your skin and the top of the cuff Check 2 readings separated by at least one minute Keep a log of your BP readings For a visual, please reference this diagram: http://ccnc.care/bpdiagram  Provider Name: [facility]     Phone: [phone]  Zone 1: ALL CLEAR  Continue to monitor your symptoms:  BP reading is less than 140 (top number) or less than 90 (bottom number)  No right upper stomach pain No headaches or seeing spots No feeling nauseated or throwing up No swelling in face and hands  Zone 2: CAUTION Call your doctor's office for any of the following:  BP reading is greater than 140 (top number) or greater than   90 (bottom number)  Stomach pain under your ribs in the middle or right side Headaches or seeing spots Feeling nauseated or throwing up Swelling in face and hands  Zone 3: EMERGENCY  Seek immediate medical care if you have any of the following:  BP reading is greater than160 (top number) or greater than 110 (bottom number) Severe headaches not improving with Tylenol Serious difficulty catching your breath Any worsening symptoms from  Zone 2     Second Trimester of Pregnancy The second trimester is from week 14 through week 27 (months 4 through 6). The second trimester is often a time when you feel your best. Your body has adjusted to being pregnant, and you begin to feel better physically. Usually, morning sickness has lessened or quit completely, you may have more energy, and you may have an increase in appetite. The second trimester is also a time when the fetus is growing rapidly. At the end of the sixth month, the fetus is about 9 inches long and weighs about 1 pounds. You will likely begin to feel the baby move (quickening) between 16 and 20 weeks of pregnancy. Body changes during your second trimester Your body continues to go through many changes during your second trimester. The changes vary from woman to woman. Your weight will continue to increase. You will notice your lower abdomen bulging out. You may begin to get stretch marks on your hips, abdomen, and breasts. You may develop headaches that can be relieved by medicines. The medicines should be approved by your health care provider. You may urinate more often because the fetus is pressing on your bladder. You may develop or continue to have heartburn as a result of your pregnancy. You may develop constipation because certain hormones are causing the muscles that push waste through your intestines to slow down. You may develop hemorrhoids or swollen, bulging veins (varicose veins). You may have back pain. This is caused by: Weight gain. Pregnancy hormones that are relaxing the joints in your pelvis. A shift in weight and the muscles that support your balance. Your breasts will continue to grow and they will continue to become tender. Your gums may bleed and may be sensitive to brushing and flossing. Dark spots or blotches (chloasma, mask of pregnancy) may develop on your face. This will likely fade after the baby is born. A dark line from your belly button to  the pubic area (linea nigra) may appear. This will likely fade after the baby is born. You may have changes in your hair. These can include thickening of your hair, rapid growth, and changes in texture. Some women also have hair loss during or after pregnancy, or hair that feels dry or thin. Your hair will most likely return to normal after your baby is born.  What to expect at prenatal visits During a routine prenatal visit: You will be weighed to make sure you and the fetus are growing normally. Your blood pressure will be taken. Your abdomen will be measured to track your baby's growth. The fetal heartbeat will be listened to. Any test results from the previous visit will be discussed.  Your health care provider may ask you: How you are feeling. If you are feeling the baby move. If you have had any abnormal symptoms, such as leaking fluid, bleeding, severe headaches, or abdominal cramping. If you are using any tobacco products, including cigarettes, chewing tobacco, and electronic cigarettes. If you have any questions.  Other tests that may be performed during   your second trimester include: Blood tests that check for: Low iron levels (anemia). High blood sugar that affects pregnant women (gestational diabetes) between 24 and 28 weeks. Rh antibodies. This is to check for a protein on red blood cells (Rh factor). Urine tests to check for infections, diabetes, or protein in the urine. An ultrasound to confirm the proper growth and development of the baby. An amniocentesis to check for possible genetic problems. Fetal screens for spina bifida and Down syndrome. HIV (human immunodeficiency virus) testing. Routine prenatal testing includes screening for HIV, unless you choose not to have this test.  Follow these instructions at home: Medicines Follow your health care provider's instructions regarding medicine use. Specific medicines may be either safe or unsafe to take during  pregnancy. Take a prenatal vitamin that contains at least 600 micrograms (mcg) of folic acid. If you develop constipation, try taking a stool softener if your health care provider approves. Eating and drinking Eat a balanced diet that includes fresh fruits and vegetables, whole grains, good sources of protein such as meat, eggs, or tofu, and low-fat dairy. Your health care provider will help you determine the amount of weight gain that is right for you. Avoid raw meat and uncooked cheese. These carry germs that can cause birth defects in the baby. If you have low calcium intake from food, talk to your health care provider about whether you should take a daily calcium supplement. Limit foods that are high in fat and processed sugars, such as fried and sweet foods. To prevent constipation: Drink enough fluid to keep your urine clear or pale yellow. Eat foods that are high in fiber, such as fresh fruits and vegetables, whole grains, and beans. Activity Exercise only as directed by your health care provider. Most women can continue their usual exercise routine during pregnancy. Try to exercise for 30 minutes at least 5 days a week. Stop exercising if you experience uterine contractions. Avoid heavy lifting, wear low heel shoes, and practice good posture. A sexual relationship may be continued unless your health care provider directs you otherwise. Relieving pain and discomfort Wear a good support bra to prevent discomfort from breast tenderness. Take warm sitz baths to soothe any pain or discomfort caused by hemorrhoids. Use hemorrhoid cream if your health care provider approves. Rest with your legs elevated if you have leg cramps or low back pain. If you develop varicose veins, wear support hose. Elevate your feet for 15 minutes, 3-4 times a day. Limit salt in your diet. Prenatal Care Write down your questions. Take them to your prenatal visits. Keep all your prenatal visits as told by your health  care provider. This is important. Safety Wear your seat belt at all times when driving. Make a list of emergency phone numbers, including numbers for family, friends, the hospital, and police and fire departments. General instructions Ask your health care provider for a referral to a local prenatal education class. Begin classes no later than the beginning of month 6 of your pregnancy. Ask for help if you have counseling or nutritional needs during pregnancy. Your health care provider can offer advice or refer you to specialists for help with various needs. Do not use hot tubs, steam rooms, or saunas. Do not douche or use tampons or scented sanitary pads. Do not cross your legs for long periods of time. Avoid cat litter boxes and soil used by cats. These carry germs that can cause birth defects in the baby and possibly loss of the   fetus by miscarriage or stillbirth. Avoid all smoking, herbs, alcohol, and unprescribed drugs. Chemicals in these products can affect the formation and growth of the baby. Do not use any products that contain nicotine or tobacco, such as cigarettes and e-cigarettes. If you need help quitting, ask your health care provider. Visit your dentist if you have not gone yet during your pregnancy. Use a soft toothbrush to brush your teeth and be gentle when you floss. Contact a health care provider if: You have dizziness. You have mild pelvic cramps, pelvic pressure, or nagging pain in the abdominal area. You have persistent nausea, vomiting, or diarrhea. You have a bad smelling vaginal discharge. You have pain when you urinate. Get help right away if: You have a fever. You are leaking fluid from your vagina. You have spotting or bleeding from your vagina. You have severe abdominal cramping or pain. You have rapid weight gain or weight loss. You have shortness of breath with chest pain. You notice sudden or extreme swelling of your face, hands, ankles, feet, or legs. You  have not felt your baby move in over an hour. You have severe headaches that do not go away when you take medicine. You have vision changes. Summary The second trimester is from week 14 through week 27 (months 4 through 6). It is also a time when the fetus is growing rapidly. Your body goes through many changes during pregnancy. The changes vary from woman to woman. Avoid all smoking, herbs, alcohol, and unprescribed drugs. These chemicals affect the formation and growth your baby. Do not use any tobacco products, such as cigarettes, chewing tobacco, and e-cigarettes. If you need help quitting, ask your health care provider. Contact your health care provider if you have any questions. Keep all prenatal visits as told by your health care provider. This is important. This information is not intended to replace advice given to you by your health care provider. Make sure you discuss any questions you have with your health care provider. Document Released: 11/18/2001 Document Revised: 05/01/2016 Document Reviewed: 01/25/2013 Elsevier Interactive Patient Education  2017 [employer]  

## 2023-03-24 NOTE — Addendum Note (Signed)
Addended by: Cheral Marker on: 03/24/2023 04:13 PM   Modules accepted: Orders

## 2023-03-24 NOTE — Progress Notes (Addendum)
LOW-RISK PREGNANCY VISIT Patient name: Bailey Palmer MRN [id number]  Date of birth: 11-06-1996 Chief Complaint:   Routine Prenatal Visit  History of Present Illness:   Bailey Palmer is a 27 y.o. G43P2002 female at [redacted]w[redacted]d with an Estimated Date of Delivery: 08/09/23 being seen today for ongoing management of a low-risk pregnancy.   Today she reports no complaints. Contractions: Not present. Vag. Bleeding: None.  Movement: Present. denies leaking of fluid.     02/03/2023    2:26 PM 04/17/2021    8:48 AM 02/16/2020   11:26 AM 10/11/2019    1:43 PM 12/16/2017    3:43 PM  Depression screen PHQ 2/9  Decreased Interest 0 0 0 0 0  Down, Depressed, Hopeless 0 0 0 0 0  PHQ - 2 Score 0 0 0 0 0  Altered sleeping 1 1   0  Tired, decreased energy 0 0   0  Change in appetite 0 0   0  Feeling bad or failure about yourself  0 0   0  Trouble concentrating 0 0   0  Moving slowly or fidgety/restless 0 0   0  Suicidal thoughts 0 0   0  PHQ-9 Score 1 1   0  Difficult doing work/chores     Not difficult at all        02/03/2023    2:26 PM 04/17/2021    8:48 AM  GAD 7 : Generalized Anxiety Score  Nervous, Anxious, on Edge 0 0  Control/stop worrying 0 0  Worry too much - different things 0 0  Trouble relaxing 0 0  Restless 0 0  Easily annoyed or irritable 0 0  Afraid - awful might happen 0 0  Total GAD 7 Score 0 0      Review of Systems:   Pertinent items are noted in HPI Denies abnormal vaginal discharge w/ itching/odor/irritation, headaches, visual changes, shortness of breath, chest pain, abdominal pain, severe nausea/vomiting, or problems with urination or bowel movements unless otherwise stated above. Pertinent History Reviewed:  Reviewed past medical,surgical, social, obstetrical and family history.  Reviewed problem list, medications and allergies. Physical Assessment:   Vitals:   03/24/23 1519  BP: 131/75  Pulse: (!) 109  Weight: 198 lb 6.4 oz (90 kg)  Body mass index is  35.14 kg/m.        Physical Examination:   General appearance: Well appearing, and in no distress  Mental status: Alert, oriented to person, place, and time  Skin: Warm & dry  Cardiovascular: Normal heart rate noted  Respiratory: Normal respiratory effort, no distress  Abdomen: Soft, gravid, nontender  Pelvic: Cervical exam deferred         Extremities:    Fetal Status: Fetal Heart Rate (bpm): +u/s   Movement: Present  [place] 20+2 wks,breech,fundal right lateral placenta gr 0,normal ovaries,cx 4.5 cm,FHR 154 bpm,SVP of fluid 5 cm,EFW 363 g 60 %,anatomy complete,no obvious abnormalities   Chaperone: N/A   No results found for this or any previous visit (from the past 24 hour(s)).  Assessment & Plan:  1) Low-risk pregnancy G3P2002 at [redacted]w[redacted]d with an Estimated Date of Delivery: 08/09/23    Meds: No orders of the defined types were placed in this encounter.  Labs/procedures today: U/S  Plan:  Continue routine obstetrical care  Next visit: prefers in person    Reviewed: Preterm labor symptoms and general obstetric precautions including but not limited to vaginal bleeding, contractions, leaking of fluid  and fetal movement were reviewed in detail with the patient.  All questions were answered. Does have home bp cuff. Office bp cuff given: not applicable. Check bp weekly, let us know if consistently >140 and/or >90.  Follow-up: Return in about 4 weeks (around 04/21/2023) for LROB, CNM, in person.  No future appointments.  No orders of the defined types were placed in this encounter.  Cheral Marker CNM, [facility] 03/24/2023 3:41 PM   Pt called back, meant to tell us she's been having some discomfort w/ urination, thinks she may have UTI. Rx macrobid, send urine cx.  Cheral Marker, CNM, [facility] 03/24/2023 4:13 PM

## 2023-03-25 ENCOUNTER — Other Ambulatory Visit: Payer: Medicaid Other

## 2023-03-28 LAB — URINE CULTURE

## 2023-03-30 ENCOUNTER — Encounter: Payer: Self-pay | Admitting: Women's Health

## 2023-03-30 DIAGNOSIS — O2342 Unspecified infection of urinary tract in pregnancy, second trimester: Secondary | ICD-10-CM | POA: Insufficient documentation

## 2023-04-21 ENCOUNTER — Ambulatory Visit (INDEPENDENT_AMBULATORY_CARE_PROVIDER_SITE_OTHER): Payer: Medicaid Other | Admitting: Women's Health

## 2023-04-21 ENCOUNTER — Encounter: Payer: Medicaid Other | Admitting: Women's Health

## 2023-04-21 ENCOUNTER — Encounter: Payer: Self-pay | Admitting: Women's Health

## 2023-04-21 VITALS — BP 118/69 | HR 101 | Wt 202.0 lb

## 2023-04-21 DIAGNOSIS — Z5189 Encounter for other specified aftercare: Secondary | ICD-10-CM

## 2023-04-21 DIAGNOSIS — Z3482 Encounter for supervision of other normal pregnancy, second trimester: Secondary | ICD-10-CM

## 2023-04-21 DIAGNOSIS — Z3480 Encounter for supervision of other normal pregnancy, unspecified trimester: Secondary | ICD-10-CM

## 2023-04-21 DIAGNOSIS — Z3A24 24 weeks gestation of pregnancy: Secondary | ICD-10-CM

## 2023-04-21 DIAGNOSIS — O2342 Unspecified infection of urinary tract in pregnancy, second trimester: Secondary | ICD-10-CM

## 2023-04-21 NOTE — Patient Instructions (Addendum)
Bailey Palmer, thank you for choosing our office today! We appreciate the opportunity to meet your healthcare needs. You may receive a short survey by mail, e-mail, or through [employer]. If you are happy with your care we would appreciate if you could take just a few minutes to complete the survey questions. We read all of your comments and take your feedback very seriously. Thank you again for choosing our office.  Center for [employer] Healthcare Team at [facility]  [facility] at [facility] ([address] [place], [place] [address]) Entrance C, located off of [address] 24/7 valet parking   For itching:  Avoid hot showers/baths, take cool/luke-warm showers/baths instead Cool washcloths to itchy areas Aquaphor or Eucerin creams to moisturize the skin Hydrocortisone or Benadryl cream, or Benadryl by mouth for the itching Oatmeal baths   You will have your sugar test next visit.  Please do not eat or drink anything after midnight the night before you come, not even water.  You will be here for at least two hours.  Please make an appointment online for the bloodwork at [email or web address] for 8:00am (or as close to this as possible). Make sure you select the [facility] service center.   CLASSES: Go to [email or web address] to register for classes (childbirth, breastfeeding, waterbirth, infant CPR, daddy bootcamp, etc.)  Call the office [phone]) or go to [facility] if: You begin to have strong, frequent contractions Your water breaks.  Sometimes it is a big gush of fluid, sometimes it is just a trickle that keeps getting your panties wet or running down your legs You have vaginal bleeding.  It is normal to have a small amount of spotting if your cervix was checked.  You don't feel your baby moving like normal.  If you don't, get you something to eat and drink and lay down and focus on feeling your baby move.   If your baby is still not moving like normal, you should call the office or  go to [facility].  Call the office [phone]) or go to [facility] hospital for these signs of pre-eclampsia: Severe headache that does not go away with Tylenol Visual changes- seeing spots, double, blurred vision Pain under your right breast or upper abdomen that does not go away with Tums or heartburn medicine Nausea and/or vomiting Severe swelling in your hands, feet, and face    [place] Pediatricians/Family Doctors [facility]): [address] Colette Ribas, [phone]           [facility]: [address], [phone]                [facility]): [address], [phone]  [facility] Department: [address], [place], [phone]    [facility] Pediatricians/Family Doctors [facility]): [address], [phone] [facility]: [address] [place], [phone] [facility]: [address], [phone]  [facility] Doctors  [facility]): [phone] [facility]: [address], [phone]   [facility] Doctors [facility]: [address] [phone]  [facility] Doctors  [facility]: [phone], [phone]  Home Blood Pressure Monitoring for Patients   Your provider has recommended that you check your blood pressure (BP) at least once a week at home. If you do not have a blood pressure cuff at home, one will  be provided for you. Contact your provider if you have not received your monitor within 1 week.   Helpful Tips for Accurate Home Blood Pressure Checks  Don't smoke, exercise, or drink caffeine 30 minutes before checking your BP Use the restroom before checking your BP (a full bladder can raise your pressure) Relax in a comfortable upright chair Feet on the ground Left arm resting comfortably on a flat surface at the  level of your heart Legs uncrossed Back supported Sit quietly and don't talk Place the cuff on your bare arm Adjust snuggly, so that only two fingertips can fit between your skin and the top of the cuff Check 2 readings separated by at least one minute Keep a log of your BP readings For a visual, please reference this diagram: http://ccnc.care/bpdiagram  Provider Name: [facility]     Phone: [phone]  Zone 1: ALL CLEAR  Continue to monitor your symptoms:  BP reading is less than 140 (top number) or less than 90 (bottom number)  No right upper stomach pain No headaches or seeing spots No feeling nauseated or throwing up No swelling in face and hands  Zone 2: CAUTION Call your doctor's office for any of the following:  BP reading is greater than 140 (top number) or greater than 90 (bottom number)  Stomach pain under your ribs in the middle or right side Headaches or seeing spots Feeling nauseated or throwing up Swelling in face and hands  Zone 3: EMERGENCY  Seek immediate medical care if you have any of the following:  BP reading is greater than160 (top number) or greater than 110 (bottom number) Severe headaches not improving with Tylenol Serious difficulty catching your breath Any worsening symptoms from Zone 2   Second Trimester of Pregnancy The second trimester is from week 13 through week 28, months 4 through 6. The second trimester is often a time when you feel your best. Your body has also adjusted to being pregnant, and you begin to feel better physically. Usually, morning sickness has lessened or quit completely, you may have more energy, and you may have an increase in appetite. The second trimester is also a time when the fetus is growing rapidly. At the end of the sixth month, the fetus is about 9 inches long and weighs about 1 pounds. You will likely begin to feel the baby move (quickening) between 18 and 20 weeks of the pregnancy. BODY CHANGES Your body  goes through many changes during pregnancy. The changes vary from woman to woman.  Your weight will continue to increase. You will notice your lower abdomen bulging out. You may begin to get stretch marks on your hips, abdomen, and breasts. You may develop headaches that can be relieved by medicines approved by your health care provider. You may urinate more often because the fetus is pressing on your bladder. You may develop or continue to have heartburn as a result of your pregnancy. You may develop constipation because certain hormones are causing the muscles that push waste through your intestines to slow down. You may develop hemorrhoids or swollen, bulging veins (varicose veins). You may have back pain because of the weight gain and pregnancy hormones relaxing your joints between the bones in your pelvis and as a result of a shift in weight and the muscles that support your balance. Your breasts will continue to grow and be tender. Your gums may bleed and may be sensitive to brushing and flossing. Dark spots or blotches (chloasma,  mask of pregnancy) may develop on your face. This will likely fade after the baby is born. A dark line from your belly button to the pubic area (linea nigra) may appear. This will likely fade after the baby is born. You may have changes in your hair. These can include thickening of your hair, rapid growth, and changes in texture. Some women also have hair loss during or after pregnancy, or hair that feels dry or thin. Your hair will most likely return to normal after your baby is born. WHAT TO EXPECT AT YOUR PRENATAL VISITS During a routine prenatal visit: You will be weighed to make sure you and the fetus are growing normally. Your blood pressure will be taken. Your abdomen will be measured to track your baby's growth. The fetal heartbeat will be listened to. Any test results from the previous visit will be discussed. Your health care provider may ask you: How  you are feeling. If you are feeling the baby move. If you have had any abnormal symptoms, such as leaking fluid, bleeding, severe headaches, or abdominal cramping. If you have any questions. Other tests that may be performed during your second trimester include: Blood tests that check for: Low iron levels (anemia). Gestational diabetes (between 24 and 28 weeks). Rh antibodies. Urine tests to check for infections, diabetes, or protein in the urine. An ultrasound to confirm the proper growth and development of the baby. An amniocentesis to check for possible genetic problems. Fetal screens for spina bifida and Down syndrome. HOME CARE INSTRUCTIONS  Avoid all smoking, herbs, alcohol, and unprescribed drugs. These chemicals affect the formation and growth of the baby. Follow your health care provider's instructions regarding medicine use. There are medicines that are either safe or unsafe to take during pregnancy. Exercise only as directed by your health care provider. Experiencing uterine cramps is a good sign to stop exercising. Continue to eat regular, healthy meals. Wear a good support bra for breast tenderness. Do not use hot tubs, steam rooms, or saunas. Wear your seat belt at all times when driving. Avoid raw meat, uncooked cheese, cat litter boxes, and soil used by cats. These carry germs that can cause birth defects in the baby. Take your prenatal vitamins. Try taking a stool softener (if your health care provider approves) if you develop constipation. Eat more high-fiber foods, such as fresh vegetables or fruit and whole grains. Drink plenty of fluids to keep your urine clear or pale yellow. Take warm sitz baths to soothe any pain or discomfort caused by hemorrhoids. Use hemorrhoid cream if your health care provider approves. If you develop varicose veins, wear support hose. Elevate your feet for 15 minutes, 3-4 times a day. Limit salt in your diet. Avoid heavy lifting, wear low heel  shoes, and practice good posture. Rest with your legs elevated if you have leg cramps or low back pain. Visit your dentist if you have not gone yet during your pregnancy. Use a soft toothbrush to brush your teeth and be gentle when you floss. A sexual relationship may be continued unless your health care provider directs you otherwise. Continue to go to all your prenatal visits as directed by your health care provider. SEEK MEDICAL CARE IF:  You have dizziness. You have mild pelvic cramps, pelvic pressure, or nagging pain in the abdominal area. You have persistent nausea, vomiting, or diarrhea. You have a bad smelling vaginal discharge. You have pain with urination. SEEK IMMEDIATE MEDICAL CARE IF:  You have a fever.  You are leaking fluid from your vagina. You have spotting or bleeding from your vagina. You have severe abdominal cramping or pain. You have rapid weight gain or loss. You have shortness of breath with chest pain. You notice sudden or extreme swelling of your face, hands, ankles, feet, or legs. You have not felt your baby move in over an hour. You have severe headaches that do not go away with medicine. You have vision changes. Document Released: 11/18/2001 Document Revised: 11/29/2013 Document Reviewed: 01/25/2013 [facility] Patient Information 2015 [place], [place]. This information is not intended to replace advice given to you by your health care provider. Make sure you discuss any questions you have with your health care provider.

## 2023-04-21 NOTE — Progress Notes (Signed)
LOW-RISK PREGNANCY VISIT Patient name: Bailey Palmer MRN [id number]  Date of birth: 17-Oct-1996 Chief Complaint:   Routine Prenatal Visit (Urine culture)  History of Present Illness:   Bailey Palmer is a 27 y.o. G25P2002 female at [redacted]w[redacted]d with an Estimated Date of Delivery: 08/09/23 being seen today for ongoing management of a low-risk pregnancy.   Today she reports  itching under breasts, some on belly-none on palms/soles, not worse at night. 1 dizzy episode. Some pressure . Contractions: Not present.  .  Movement: Present. denies leaking of fluid.     02/03/2023    2:26 PM 04/17/2021    8:48 AM 02/16/2020   11:26 AM 10/11/2019    1:43 PM 12/16/2017    3:43 PM  Depression screen PHQ 2/9  Decreased Interest 0 0 0 0 0  Down, Depressed, Hopeless 0 0 0 0 0  PHQ - 2 Score 0 0 0 0 0  Altered sleeping 1 1   0  Tired, decreased energy 0 0   0  Change in appetite 0 0   0  Feeling bad or failure about yourself  0 0   0  Trouble concentrating 0 0   0  Moving slowly or fidgety/restless 0 0   0  Suicidal thoughts 0 0   0  PHQ-9 Score 1 1   0  Difficult doing work/chores     Not difficult at all        02/03/2023    2:26 PM 04/17/2021    8:48 AM  GAD 7 : Generalized Anxiety Score  Nervous, Anxious, on Edge 0 0  Control/stop worrying 0 0  Worry too much - different things 0 0  Trouble relaxing 0 0  Restless 0 0  Easily annoyed or irritable 0 0  Afraid - awful might happen 0 0  Total GAD 7 Score 0 0      Review of Systems:   Pertinent items are noted in HPI Denies abnormal vaginal discharge w/ itching/odor/irritation, headaches, visual changes, shortness of breath, chest pain, abdominal pain, severe nausea/vomiting, or problems with urination or bowel movements unless otherwise stated above. Pertinent History Reviewed:  Reviewed past medical,surgical, social, obstetrical and family history.  Reviewed problem list, medications and allergies. Physical Assessment:   Vitals:    04/21/23 1621  BP: 118/69  Pulse: (!) 101  Weight: 202 lb (91.6 kg)  Body mass index is 35.78 kg/m.        Physical Examination:   General appearance: Well appearing, and in no distress  Mental status: Alert, oriented to person, place, and time  Skin: Warm & dry  Cardiovascular: Normal heart rate noted  Respiratory: Normal respiratory effort, no distress  Abdomen: Soft, gravid, nontender  Pelvic: Cervical exam deferred         Extremities: Edema: None  Fetal Status: Fetal Heart Rate (bpm): 157 Fundal Height: 25 cm Movement: Present    Chaperone: N/A   No results found for this or any previous visit (from the past 24 hour(s)).  Assessment & Plan:  1) Low-risk pregnancy G3P2002 at [redacted]w[redacted]d with an Estimated Date of Delivery: 08/09/23   2) Itching, under breasts/abd, none on palms/soles, not worse at night. Gave printed prevention/relief measures To let us know if gets worse at night or on palms/soles and will check labs.   3) Dizzy episode> x 1, reviewed reasons and ways to prevent  4) Pressure> discussed  5) UTI last visit> urine cx poc today  Meds: No orders of the defined types were placed in this encounter.  Labs/procedures today: none  Plan:  Continue routine obstetrical care  Next visit: prefers will be in person for pn2     Reviewed: Preterm labor symptoms and general obstetric precautions including but not limited to vaginal bleeding, contractions, leaking of fluid and fetal movement were reviewed in detail with the patient.  All questions were answered. Does have home bp cuff. Office bp cuff given: not applicable. Check bp weekly, let us know if consistently >140 and/or >90.  Follow-up: Return in about 4 weeks (around 05/19/2023) for LROB, PN2, CNM, in person.  Future Appointments  Date Time Provider Department Center  05/18/2023  8:30 AM [facility]  05/18/2023  9:30 AM Cheral Marker, CNM [facility]    Orders Placed This Encounter   Procedures   Urine Culture   Cheral Marker CNM, [facility] 04/21/2023 4:49 PM

## 2023-04-23 LAB — URINE CULTURE

## 2023-05-05 ENCOUNTER — Encounter: Payer: Self-pay | Admitting: Women's Health

## 2023-05-06 ENCOUNTER — Other Ambulatory Visit: Payer: Medicaid Other

## 2023-05-06 ENCOUNTER — Ambulatory Visit (INDEPENDENT_AMBULATORY_CARE_PROVIDER_SITE_OTHER): Payer: Medicaid Other | Admitting: Advanced Practice Midwife

## 2023-05-06 ENCOUNTER — Other Ambulatory Visit (HOSPITAL_COMMUNITY)
Admission: RE | Admit: 2023-05-06 | Discharge: 2023-05-06 | Disposition: A | Discharge status: Home / Self Care | Payer: Medicaid Other | Source: Ambulatory Visit | Attending: Advanced Practice Midwife | Admitting: Advanced Practice Midwife

## 2023-05-06 ENCOUNTER — Encounter: Payer: Self-pay | Admitting: Advanced Practice Midwife

## 2023-05-06 VITALS — BP 117/70 | HR 108 | Wt 199.0 lb

## 2023-05-06 DIAGNOSIS — Z3480 Encounter for supervision of other normal pregnancy, unspecified trimester: Secondary | ICD-10-CM

## 2023-05-06 DIAGNOSIS — N898 Other specified noninflammatory disorders of vagina: Secondary | ICD-10-CM | POA: Insufficient documentation

## 2023-05-06 DIAGNOSIS — I863 Vulval varices: Secondary | ICD-10-CM

## 2023-05-06 DIAGNOSIS — Z3482 Encounter for supervision of other normal pregnancy, second trimester: Secondary | ICD-10-CM

## 2023-05-06 DIAGNOSIS — Z3A26 26 weeks gestation of pregnancy: Secondary | ICD-10-CM

## 2023-05-06 NOTE — Progress Notes (Signed)
   LOW-RISK PREGNANCY VISIT Patient name: Bailey Palmer MRN [id number]  Date of birth: Jun 03, 1996 Chief Complaint:   work in ob (Knot in side rt labia)  History of Present Illness:   Bailey Palmer is a 27 y.o. G78P2002 female at [redacted]w[redacted]d with an Estimated Date of Delivery: 08/09/23 being seen today for ongoing management of a low-risk pregnancy.  Today she reports  has malodorous vag d/c; also concerned re 'knot' on R labia x few weeks now, intermittently more prominent . Contractions: Not present.  .  Movement: Present. denies leaking of fluid. Review of Systems:   Pertinent items are noted in HPI Denies abnormal vaginal discharge w/ itching/odor/irritation, headaches, visual changes, shortness of breath, chest pain, abdominal pain, severe nausea/vomiting, or problems with urination or bowel movements unless otherwise stated above. Pertinent History Reviewed:  Reviewed past medical,surgical, social, obstetrical and family history.  Reviewed problem list, medications and allergies. Physical Assessment:   Vitals:   05/06/23 1324  BP: 117/70  Pulse: (!) 108  Weight: 199 lb (90.3 kg)  Body mass index is 35.25 kg/m.        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  Assessment & Plan:  1) Low-risk pregnancy G3P2002 at [redacted]w[redacted]d with an Estimated Date of Delivery: 08/09/23   2) R vulvar varicosities, recommended Prenatal Cradle V2 supporter, also donut pillow to sit on  3) Malodorous vag d/c,  CV swab sent   Meds: No orders of the defined types were placed in this encounter.  Labs/procedures today: CV swab  Plan:  Continue routine obstetrical care   Reviewed: Preterm labor symptoms and general obstetric precautions including but not limited to vaginal bleeding, contractions, leaking of fluid and fetal movement were reviewed in detail with the patient.  All questions were answered. Didn't ask about home bp cuff. Check bp weekly, let us know if >140/90.   Follow-up: Return for As scheduled. (Has PN2 w next visit)  No orders of the defined types were placed in this encounter.  [name] CNM 05/06/2023 2:07 PM

## 2023-05-06 NOTE — Patient Instructions (Addendum)
Bailey Palmer, thank you for choosing our office today! We appreciate the opportunity to meet your healthcare needs. You may receive a short survey by mail, e-mail, or through [employer]. If you are happy with your care we would appreciate if you could take just a few minutes to complete the survey questions. We read all of your comments and take your feedback very seriously. Thank you again for choosing our office.  [facility] Healthcare Team at [facility]   Try Prenatal Cradle, then V2 support belt for vulvar varicosities Also try a 'donut pillow' for sitting on (used for hemorrhoids)    [facility] at [facility] [place], [place] [address]) Entrance C, located off of [address] [employer] valet parking  Go to [email or web address] to register for FREE online childbirth classes  Call the office [phone]) or go to [facility] if: You begin to severe cramping Your water breaks.  Sometimes it is a big gush of fluid, sometimes it is just a trickle that keeps getting your panties wet or running down your legs You have vaginal bleeding.  It is normal to have a small amount of spotting if your cervix was checked.   [facility] Pediatricians/Family Doctors [facility]): [address] Colette Ribas, [phone]           [facility]: [address], [phone]                [facility]): [address], [phone] (call to ask if accepting patients) [facility] Department: [address], [place], [phone]    [facility] Pediatricians/Family Doctors [facility]): [phone] [address], [phone] [facility]: [address] [place], [phone] [facility]: [address], [phone]  [facility] Doctors  [facility]): [phone] [facility]: [address]  [phone]   [facility] Doctors [facility]: [address], [phone]  [facility] Doctors  [facility]: [phone], [phone]  Home Blood Pressure Monitoring for Patients   Your provider has recommended that you check your blood pressure (BP) at least once a week at home. If you do not have a blood pressure cuff at home, one will be provided for you. Contact your provider if you have not received your monitor within 1 week.   Helpful Tips for Accurate Home Blood Pressure Checks  Don't smoke, exercise, or drink caffeine 30 minutes before checking your BP Use the restroom before checking your BP (a full bladder can raise your pressure) Relax in a comfortable upright chair Feet on the ground Left arm resting comfortably on a flat surface at the level of your heart Legs uncrossed Back supported Sit quietly and don't talk Place the cuff on your bare arm Adjust snuggly, so that only two fingertips can fit between your skin and the top of the cuff Check 2 readings separated by at least one minute Keep a log of your BP readings For a visual, please reference this diagram: http://ccnc.care/bpdiagram  Provider Name: [facility]     Phone: [phone]  Zone 1: ALL CLEAR  Continue to monitor your symptoms:  BP reading is less than 140 (top number) or less than 90 (bottom number)  No right upper stomach pain No headaches or seeing spots No feeling nauseated or throwing up No swelling in face and  hands  Zone 2: CAUTION Call your doctor's office for any of the following:  BP reading is greater than 140 (top number) or greater than 90 (bottom number)  Stomach pain under your ribs in the middle or right side Headaches or seeing spots Feeling nauseated or throwing up Swelling in face and hands  Zone 3: EMERGENCY  Seek immediate medical care if you have any of the following:  BP reading is greater than160 (top number) or  greater than 110 (bottom number) Severe headaches not improving with Tylenol Serious difficulty catching your breath Any worsening symptoms from Zone 2     Second Trimester of Pregnancy The second trimester is from week 14 through week 27 (months 4 through 6). The second trimester is often a time when you feel your best. Your body has adjusted to being pregnant, and you begin to feel better physically. Usually, morning sickness has lessened or quit completely, you may have more energy, and you may have an increase in appetite. The second trimester is also a time when the fetus is growing rapidly. At the end of the sixth month, the fetus is about 9 inches long and weighs about 1 pounds. You will likely begin to feel the baby move (quickening) between 16 and 20 weeks of pregnancy. Body changes during your second trimester Your body continues to go through many changes during your second trimester. The changes vary from woman to woman. Your weight will continue to increase. You will notice your lower abdomen bulging out. You may begin to get stretch marks on your hips, abdomen, and breasts. You may develop headaches that can be relieved by medicines. The medicines should be approved by your health care provider. You may urinate more often because the fetus is pressing on your bladder. You may develop or continue to have heartburn as a result of your pregnancy. You may develop constipation because certain hormones are causing the muscles that push waste through your intestines to slow down. You may develop hemorrhoids or swollen, bulging veins (varicose veins). You may have back pain. This is caused by: Weight gain. Pregnancy hormones that are relaxing the joints in your pelvis. A shift in weight and the muscles that support your balance. Your breasts will continue to grow and they will continue to become tender. Your gums may bleed and may be sensitive to brushing and flossing. Dark spots or  blotches (chloasma, mask of pregnancy) may develop on your face. This will likely fade after the baby is born. A dark line from your belly button to the pubic area (linea nigra) may appear. This will likely fade after the baby is born. You may have changes in your hair. These can include thickening of your hair, rapid growth, and changes in texture. Some women also have hair loss during or after pregnancy, or hair that feels dry or thin. Your hair will most likely return to normal after your baby is born.  What to expect at prenatal visits During a routine prenatal visit: You will be weighed to make sure you and the fetus are growing normally. Your blood pressure will be taken. Your abdomen will be measured to track your baby's growth. The fetal heartbeat will be listened to. Any test results from the previous visit will be discussed.  Your health care provider may ask you: How you are feeling. If you are feeling the baby move. If you have had any abnormal symptoms, such as leaking fluid, bleeding, severe headaches, or abdominal cramping. If  you are using any tobacco products, including cigarettes, chewing tobacco, and electronic cigarettes. If you have any questions.  Other tests that may be performed during your second trimester include: Blood tests that check for: Low iron levels (anemia). High blood sugar that affects pregnant women (gestational diabetes) between 51 and 28 weeks. Rh antibodies. This is to check for a protein on red blood cells (Rh factor). Urine tests to check for infections, diabetes, or protein in the urine. An ultrasound to confirm the proper growth and development of the baby. An amniocentesis to check for possible genetic problems. Fetal screens for spina bifida and Down syndrome. HIV (human immunodeficiency virus) testing. Routine prenatal testing includes screening for HIV, unless you choose not to have this test.  Follow these instructions at  home: Medicines Follow your health care provider's instructions regarding medicine use. Specific medicines may be either safe or unsafe to take during pregnancy. Take a prenatal vitamin that contains at least 600 micrograms (mcg) of folic acid. If you develop constipation, try taking a stool softener if your health care provider approves. Eating and drinking Eat a balanced diet that includes fresh fruits and vegetables, whole grains, good sources of protein such as meat, eggs, or tofu, and low-fat dairy. Your health care provider will help you determine the amount of weight gain that is right for you. Avoid raw meat and uncooked cheese. These carry germs that can cause birth defects in the baby. If you have low calcium intake from food, talk to your health care provider about whether you should take a daily calcium supplement. Limit foods that are high in fat and processed sugars, such as fried and sweet foods. To prevent constipation: Drink enough fluid to keep your urine clear or pale yellow. Eat foods that are high in fiber, such as fresh fruits and vegetables, whole grains, and beans. Activity Exercise only as directed by your health care provider. Most women can continue their usual exercise routine during pregnancy. Try to exercise for 30 minutes at least 5 days a week. Stop exercising if you experience uterine contractions. Avoid heavy lifting, wear low heel shoes, and practice good posture. A sexual relationship may be continued unless your health care provider directs you otherwise. Relieving pain and discomfort Wear a good support bra to prevent discomfort from breast tenderness. Take warm sitz baths to soothe any pain or discomfort caused by hemorrhoids. Use hemorrhoid cream if your health care provider approves. Rest with your legs elevated if you have leg cramps or low back pain. If you develop varicose veins, wear support hose. Elevate your feet for 15 minutes, 3-4 times a day.  Limit salt in your diet. Prenatal Care Write down your questions. Take them to your prenatal visits. Keep all your prenatal visits as told by your health care provider. This is important. Safety Wear your seat belt at all times when driving. Make a list of emergency phone numbers, including numbers for family, friends, the hospital, and police and fire departments. General instructions Ask your health care provider for a referral to a local prenatal education class. Begin classes no later than the beginning of month 6 of your pregnancy. Ask for help if you have counseling or nutritional needs during pregnancy. Your health care provider can offer advice or refer you to specialists for help with various needs. Do not use hot tubs, steam rooms, or saunas. Do not douche or use tampons or scented sanitary pads. Do not cross your legs for long periods of  time. Avoid cat litter boxes and soil used by cats. These carry germs that can cause birth defects in the baby and possibly loss of the fetus by miscarriage or stillbirth. Avoid all smoking, herbs, alcohol, and unprescribed drugs. Chemicals in these products can affect the formation and growth of the baby. Do not use any products that contain nicotine or tobacco, such as cigarettes and e-cigarettes. If you need help quitting, ask your health care provider. Visit your dentist if you have not gone yet during your pregnancy. Use a soft toothbrush to brush your teeth and be gentle when you floss. Contact a health care provider if: You have dizziness. You have mild pelvic cramps, pelvic pressure, or nagging pain in the abdominal area. You have persistent nausea, vomiting, or diarrhea. You have a bad smelling vaginal discharge. You have pain when you urinate. Get help right away if: You have a fever. You are leaking fluid from your vagina. You have spotting or bleeding from your vagina. You have severe abdominal cramping or pain. You have rapid  weight gain or weight loss. You have shortness of breath with chest pain. You notice sudden or extreme swelling of your face, hands, ankles, feet, or legs. You have not felt your baby move in over an hour. You have severe headaches that do not go away when you take medicine. You have vision changes. Summary The second trimester is from week 14 through week 27 (months 4 through 6). It is also a time when the fetus is growing rapidly. Your body goes through many changes during pregnancy. The changes vary from woman to woman. Avoid all smoking, herbs, alcohol, and unprescribed drugs. These chemicals affect the formation and growth your baby. Do not use any tobacco products, such as cigarettes, chewing tobacco, and e-cigarettes. If you need help quitting, ask your health care provider. Contact your health care provider if you have any questions. Keep all prenatal visits as told by your health care provider. This is important. This information is not intended to replace advice given to you by your health care provider. Make sure you discuss any questions you have with your health care provider. Document Released: 11/18/2001 Document Revised: 05/01/2016 Document Reviewed: 01/25/2013 Elsevier Interactive Patient Education  2017 [employer].

## 2023-05-08 LAB — CERVICOVAGINAL ANCILLARY ONLY
Bacterial Vaginitis (gardnerella): POSITIVE — AB
Candida Glabrata: NEGATIVE
Candida Vaginitis: NEGATIVE
Chlamydia: NEGATIVE
Comment: NEGATIVE
Comment: NEGATIVE
Comment: NEGATIVE
Comment: NEGATIVE
Comment: NEGATIVE
Comment: NORMAL
Neisseria Gonorrhea: NEGATIVE
Trichomonas: NEGATIVE

## 2023-05-10 ENCOUNTER — Other Ambulatory Visit: Payer: Self-pay | Admitting: Advanced Practice Midwife

## 2023-05-10 DIAGNOSIS — B9689 Other specified bacterial agents as the cause of diseases classified elsewhere: Secondary | ICD-10-CM

## 2023-05-10 MED ORDER — METRONIDAZOLE 500 MG PO TABS: 500.0000 mg | ORAL_TABLET | Freq: Two times a day (BID) | ORAL | 0 refills | Status: DC

## 2023-05-18 ENCOUNTER — Other Ambulatory Visit: Payer: Medicaid Other

## 2023-05-18 ENCOUNTER — Ambulatory Visit (INDEPENDENT_AMBULATORY_CARE_PROVIDER_SITE_OTHER): Payer: Medicaid Other | Admitting: Women's Health

## 2023-05-18 ENCOUNTER — Encounter: Payer: Self-pay | Admitting: Women's Health

## 2023-05-18 VITALS — BP 115/65 | HR 96 | Wt 198.4 lb

## 2023-05-18 DIAGNOSIS — O09293 Supervision of pregnancy with other poor reproductive or obstetric history, third trimester: Secondary | ICD-10-CM | POA: Diagnosis not present

## 2023-05-18 DIAGNOSIS — Z3480 Encounter for supervision of other normal pregnancy, unspecified trimester: Secondary | ICD-10-CM

## 2023-05-18 DIAGNOSIS — Z3A28 28 weeks gestation of pregnancy: Secondary | ICD-10-CM | POA: Diagnosis not present

## 2023-05-18 DIAGNOSIS — Z131 Encounter for screening for diabetes mellitus: Secondary | ICD-10-CM

## 2023-05-18 DIAGNOSIS — Z23 Encounter for immunization: Secondary | ICD-10-CM

## 2023-05-18 DIAGNOSIS — O09299 Supervision of pregnancy with other poor reproductive or obstetric history, unspecified trimester: Secondary | ICD-10-CM | POA: Insufficient documentation

## 2023-05-18 DIAGNOSIS — Z3483 Encounter for supervision of other normal pregnancy, third trimester: Secondary | ICD-10-CM

## 2023-05-18 DIAGNOSIS — Z3A27 27 weeks gestation of pregnancy: Secondary | ICD-10-CM

## 2023-05-18 NOTE — Progress Notes (Signed)
LOW-RISK PREGNANCY VISIT Patient name: Bailey Palmer MRN [id number]  Date of birth: March 06, 1996 Chief Complaint:   Routine Prenatal Visit (PN2)  History of Present Illness:   Bailey Palmer is a 27 y.o. G31P2002 female at [redacted]w[redacted]d with an Estimated Date of Delivery: 08/09/23 being seen today for ongoing management of a low-risk pregnancy.   Today she reports  mild itching legs and under breasts, not on palms/soles, not worse at night . Contractions: Not present.  .  Movement: Present. denies leaking of fluid.     05/18/2023    9:06 AM 02/03/2023    2:26 PM 04/17/2021    8:48 AM 02/16/2020   11:26 AM 10/11/2019    1:43 PM  Depression screen PHQ 2/9  Decreased Interest 0 0 0 0 0  Down, Depressed, Hopeless 0 0 0 0 0  PHQ - 2 Score 0 0 0 0 0  Altered sleeping 1 1 1     Tired, decreased energy  0 0    Change in appetite 0 0 0    Feeling bad or failure about yourself  0 0 0    Trouble concentrating 0 0 0    Moving slowly or fidgety/restless 0 0 0    Suicidal thoughts 0 0 0    PHQ-9 Score 1 1 1     Difficult doing work/chores Not difficult at all            02/03/2023    2:26 PM 04/17/2021    8:48 AM  GAD 7 : Generalized Anxiety Score  Nervous, Anxious, on Edge 0 0  Control/stop worrying 0 0  Worry too much - different things 0 0  Trouble relaxing 0 0  Restless 0 0  Easily annoyed or irritable 0 0  Afraid - awful might happen 0 0  Total GAD 7 Score 0 0      Review of Systems:   Pertinent items are noted in HPI Denies abnormal vaginal discharge w/ itching/odor/irritation, headaches, visual changes, shortness of breath, chest pain, abdominal pain, severe nausea/vomiting, or problems with urination or bowel movements unless otherwise stated above. Pertinent History Reviewed:  Reviewed past medical,surgical, social, obstetrical and family history.  Reviewed problem list, medications and allergies. Physical Assessment:   Vitals:   05/18/23 0900  BP: 115/65  Pulse: 96  Weight:  198 lb 6.4 oz (90 kg)  Body mass index is 35.14 kg/m.        Physical Examination:   General appearance: Well appearing, and in no distress  Mental status: Alert, oriented to person, place, and time  Skin: Warm & dry  Cardiovascular: Normal heart rate noted  Respiratory: Normal respiratory effort, no distress  Abdomen: Soft, gravid, nontender  Pelvic: Cervical exam deferred         Extremities: Edema: None  Fetal Status: Fetal Heart Rate (bpm): 153 Fundal Height: 28 cm Movement: Present    Chaperone: N/A   No results found for this or any previous visit (from the past 24 hour(s)).  Assessment & Plan:  1) Low-risk pregnancy G3P2002 at [redacted]w[redacted]d with an Estimated Date of Delivery: 08/09/23   2) Mild itching legs & under breasts, not palms/soles, not worse at night. Gave printed itching info. If worsens, or palms/soles, worse at night- let us know and will get labs  3) H/O 9lb baby> plan EFW ~36wks   Meds: No orders of the defined types were placed in this encounter.  Labs/procedures today: tdap and PN2  Plan:  Continue routine obstetrical care  Next visit: prefers in person    Reviewed: Preterm labor symptoms and general obstetric precautions including but not limited to vaginal bleeding, contractions, leaking of fluid and fetal movement were reviewed in detail with the patient.  All questions were answered. Does have home bp cuff. Office bp cuff given: not applicable. Check bp weekly, let us know if consistently >140 and/or >90.  Follow-up: Return in about 2 weeks (around 06/01/2023) for LROB, CNM, in person.  No future appointments.   Orders Placed This Encounter  Procedures   Tdap vaccine greater than or equal to 7yo IM   Cheral Marker CNM, [facility] 05/18/2023 9:30 AM

## 2023-05-18 NOTE — Patient Instructions (Addendum)
Bailey Palmer, thank you for choosing our office today! We appreciate the opportunity to meet your healthcare needs. You may receive a short survey by mail, e-mail, or through [employer]. If you are happy with your care we would appreciate if you could take just a few minutes to complete the survey questions. We read all of your comments and take your feedback very seriously. Thank you again for choosing our office.  Center for [employer] Team at [facility]  [facility] at [facility] ([address] [place], [place] [address]) Entrance C, located off of [address] [employer] [address] valet parking    [email or web address] FOR BREAST PUMP  For itching:  Avoid hot showers/baths, take cool/luke-warm showers/baths instead Cool washcloths to itchy areas Aquaphor or Eucerin creams to moisturize the skin Hydrocortisone or Benadryl cream, or Benadryl by mouth for the itching Oatmeal baths   CLASSES: Go to [email or web address] to register for classes (childbirth, breastfeeding, waterbirth, infant CPR, daddy bootcamp, etc.)  Call the office [phone]) or go to [facility] if: You begin to have strong, frequent contractions Your water breaks.  Sometimes it is a big gush of fluid, sometimes it is just a trickle that keeps getting your panties wet or running down your legs You have vaginal bleeding.  It is normal to have a small amount of spotting if your cervix was checked.  You don't feel your baby moving like normal.  If you don't, get you something to eat and drink and lay down and focus on feeling your baby move.   If your baby is still not moving like normal, you should call the office or go to [facility].  Call the office [phone]) or go to [facility] hospital for these signs of pre-eclampsia: Severe headache that does not go away with Tylenol Visual changes- seeing spots, double, blurred vision Pain under your right breast or upper abdomen that does not go away with Tums or heartburn  medicine Nausea and/or vomiting Severe swelling in your hands, feet, and face   Tdap Vaccine It is recommended that you get the Tdap vaccine during the third trimester of EACH pregnancy to help protect your baby from getting pertussis (whooping cough) 27-36 weeks is the BEST time to do this so that you can pass the protection on to your baby. During pregnancy is better than after pregnancy, but if you are unable to get it during pregnancy it will be offered at the hospital.  You can get this vaccine with [place], at the health department, your family doctor, or some local pharmacies Everyone who will be around your baby should also be up-to-date on their vaccines before the baby comes. Adults (who are not pregnant) only need 1 dose of Tdap during adulthood.   [facility] Pediatricians/Family Doctors [facility]): [address] Colette Ribas, [phone]           [facility]: [address], [phone]                [facility]): [address], [phone] (call to ask if accepting patients) [facility] Department: [address], [place], [phone]    [facility] Pediatricians/Family Doctors [facility]): [phone] [address], [phone] [facility]: [address] [place], [phone] [facility]: [address], [phone]  [facility] Doctors  [facility]): [phone] [facility]: [address] [phone]  [facility] Family Doctors [facility]: [address], [phone]  [facility] Doctors  [facility]: [phone], [phone]  Home Blood Pressure Monitoring for Patients   Your provider has recommended that you check your blood pressure (BP) at least once a week at home. If you do not have a blood pressure cuff at home, one will be provided  for you. Contact your provider if you have not received your monitor within 1 week.   Helpful Tips for Accurate Home Blood Pressure Checks  Don't smoke, exercise, or drink caffeine 30 minutes before checking your BP Use the restroom before checking your BP (a full bladder can raise your pressure) Relax in a comfortable upright chair Feet on the ground Left arm resting comfortably on a flat surface at the level of your heart Legs uncrossed Back supported Sit quietly and don't talk Place the cuff on your bare arm Adjust snuggly, so that only two fingertips can fit between your skin and the top of the cuff Check 2 readings separated by at least one minute Keep a log of your BP readings For a visual, please reference this diagram: http://ccnc.care/bpdiagram  Provider Name: [facility]     Phone: [phone]  Zone 1: ALL CLEAR  Continue to monitor your symptoms:  BP reading is less than 140 (top number) or less than 90 (bottom number)  No right upper stomach pain No headaches or seeing spots No feeling nauseated or throwing up No swelling in face and hands  Zone 2: CAUTION Call your doctor's office for any of the following:  BP reading is greater than 140 (top number) or greater than 90 (bottom number)  Stomach pain under your ribs in the middle or right side Headaches or seeing spots Feeling nauseated or throwing up Swelling in face and hands  Zone 3: EMERGENCY  Seek immediate medical care if you have any of the following:  BP reading is greater than160 (top number) or greater than 110 (bottom number) Severe headaches not improving with Tylenol Serious difficulty catching your breath Any worsening symptoms from Zone 2   Third Trimester of Pregnancy The third trimester is from week 29 through week 42, months 7 through 9. The third trimester is a time when the fetus is growing rapidly. At the end of the ninth month, the fetus is about 20 inches in length and weighs 6-10  pounds.  BODY CHANGES Your body goes through many changes during pregnancy. The changes vary from woman to woman.  Your weight will continue to increase. You can expect to gain 25-35 pounds (11-16 kg) by the end of the pregnancy. You may begin to get stretch marks on your hips, abdomen, and breasts. You may urinate more often because the fetus is moving lower into your pelvis and pressing on your bladder. You may develop or continue to have heartburn as a result of your pregnancy. You may develop constipation because certain hormones are causing the muscles that push waste through your intestines to slow down. You may develop hemorrhoids or swollen, bulging veins (varicose veins). You may have pelvic pain because of the weight gain and pregnancy hormones relaxing your joints between the bones in your pelvis. Backaches may result from overexertion of the muscles supporting your posture. You may have changes in your hair. These can include thickening of your hair, rapid growth, and changes in texture. Some women also have hair loss during or after pregnancy, or hair that feels dry  or thin. Your hair will most likely return to normal after your baby is born. Your breasts will continue to grow and be tender. A yellow discharge may leak from your breasts called colostrum. Your belly button may stick out. You may feel short of breath because of your expanding uterus. You may notice the fetus "dropping," or moving lower in your abdomen. You may have a bloody mucus discharge. This usually occurs a few days to a week before labor begins. Your cervix becomes thin and soft (effaced) near your due date. WHAT TO EXPECT AT YOUR PRENATAL EXAMS  You will have prenatal exams every 2 weeks until week 36. Then, you will have weekly prenatal exams. During a routine prenatal visit: You will be weighed to make sure you and the fetus are growing normally. Your blood pressure is taken. Your abdomen will be measured to  track your baby's growth. The fetal heartbeat will be listened to. Any test results from the previous visit will be discussed. You may have a cervical check near your due date to see if you have effaced. At around 36 weeks, your caregiver will check your cervix. At the same time, your caregiver will also perform a test on the secretions of the vaginal tissue. This test is to determine if a type of bacteria, Group B streptococcus, is present. Your caregiver will explain this further. Your caregiver may ask you: What your birth plan is. How you are feeling. If you are feeling the baby move. If you have had any abnormal symptoms, such as leaking fluid, bleeding, severe headaches, or abdominal cramping. If you have any questions. Other tests or screenings that may be performed during your third trimester include: Blood tests that check for low iron levels (anemia). Fetal testing to check the health, activity level, and growth of the fetus. Testing is done if you have certain medical conditions or if there are problems during the pregnancy. FALSE LABOR You may feel small, irregular contractions that eventually go away. These are called Braxton Hicks contractions, or false labor. Contractions may last for hours, days, or even weeks before true labor sets in. If contractions come at regular intervals, intensify, or become painful, it is best to be seen by your caregiver.  SIGNS OF LABOR  Menstrual-like cramps. Contractions that are 5 minutes apart or less. Contractions that start on the top of the uterus and spread down to the lower abdomen and back. A sense of increased pelvic pressure or back pain. A watery or bloody mucus discharge that comes from the vagina. If you have any of these signs before the 37th week of pregnancy, call your caregiver right away. You need to go to the hospital to get checked immediately. HOME CARE INSTRUCTIONS  Avoid all smoking, herbs, alcohol, and unprescribed drugs.  These chemicals affect the formation and growth of the baby. Follow your caregiver's instructions regarding medicine use. There are medicines that are either safe or unsafe to take during pregnancy. Exercise only as directed by your caregiver. Experiencing uterine cramps is a good sign to stop exercising. Continue to eat regular, healthy meals. Wear a good support bra for breast tenderness. Do not use hot tubs, steam rooms, or saunas. Wear your seat belt at all times when driving. Avoid raw meat, uncooked cheese, cat litter boxes, and soil used by cats. These carry germs that can cause birth defects in the baby. Take your prenatal vitamins. Try taking a stool softener (if your caregiver approves) if you develop constipation.  Eat more high-fiber foods, such as fresh vegetables or fruit and whole grains. Drink plenty of fluids to keep your urine clear or pale yellow. Take warm sitz baths to soothe any pain or discomfort caused by hemorrhoids. Use hemorrhoid cream if your caregiver approves. If you develop varicose veins, wear support hose. Elevate your feet for 15 minutes, 3-4 times a day. Limit salt in your diet. Avoid heavy lifting, wear low heal shoes, and practice good posture. Rest a lot with your legs elevated if you have leg cramps or low back pain. Visit your dentist if you have not gone during your pregnancy. Use a soft toothbrush to brush your teeth and be gentle when you floss. A sexual relationship may be continued unless your caregiver directs you otherwise. Do not travel far distances unless it is absolutely necessary and only with the approval of your caregiver. Take prenatal classes to understand, practice, and ask questions about the labor and delivery. Make a trial run to the hospital. Pack your hospital bag. Prepare the baby's nursery. Continue to go to all your prenatal visits as directed by your caregiver. SEEK MEDICAL CARE IF: You are unsure if you are in labor or if your  water has broken. You have dizziness. You have mild pelvic cramps, pelvic pressure, or nagging pain in your abdominal area. You have persistent nausea, vomiting, or diarrhea. You have a bad smelling vaginal discharge. You have pain with urination. SEEK IMMEDIATE MEDICAL CARE IF:  You have a fever. You are leaking fluid from your vagina. You have spotting or bleeding from your vagina. You have severe abdominal cramping or pain. You have rapid weight loss or gain. You have shortness of breath with chest pain. You notice sudden or extreme swelling of your face, hands, ankles, feet, or legs. You have not felt your baby move in over an hour. You have severe headaches that do not go away with medicine. You have vision changes. Document Released: 11/18/2001 Document Revised: 11/29/2013 Document Reviewed: 01/25/2013 [facility] Patient Information 2015 [place], [place]. This information is not intended to replace advice given to you by your health care provider. Make sure you discuss any questions you have with your health care provider.

## 2023-05-19 ENCOUNTER — Other Ambulatory Visit: Payer: Self-pay | Admitting: Women's Health

## 2023-05-19 LAB — CBC
Hematocrit: 30.5 % — ABNORMAL LOW (ref 34.0–46.6)
Hemoglobin: 10.1 g/dL — ABNORMAL LOW (ref 11.1–15.9)
MCH: 27.5 pg (ref 26.6–33.0)
MCHC: 33.1 g/dL (ref 31.5–35.7)
MCV: 83 fL (ref 79–97)
Platelets: 308 10*3/uL (ref 150–450)
RBC: 3.67 x10E6/uL — ABNORMAL LOW (ref 3.77–5.28)
RDW: 12.5 % (ref 11.7–15.4)
WBC: 8 10*3/uL (ref 3.4–10.8)

## 2023-05-19 LAB — ANTIBODY SCREEN: Antibody Screen: NEGATIVE

## 2023-05-19 LAB — HIV ANTIBODY (ROUTINE TESTING W REFLEX): HIV Screen 4th Generation wRfx: NONREACTIVE

## 2023-05-19 LAB — GLUCOSE TOLERANCE, 2 HOURS W/ 1HR
Glucose, 1 hour: 158 mg/dL (ref 70–179)
Glucose, 2 hour: 113 mg/dL (ref 70–152)
Glucose, Fasting: 86 mg/dL (ref 70–91)

## 2023-05-19 LAB — RPR: RPR Ser Ql: NONREACTIVE

## 2023-05-19 MED ORDER — FERROUS SULFATE 325 (65 FE) MG PO TABS
325.0000 mg | ORAL_TABLET | ORAL | 2 refills | Status: DC
Start: 2023-05-19 — End: 2024-02-11

## 2023-06-01 ENCOUNTER — Encounter: Payer: Self-pay | Admitting: Women's Health

## 2023-06-01 ENCOUNTER — Ambulatory Visit (INDEPENDENT_AMBULATORY_CARE_PROVIDER_SITE_OTHER): Payer: Medicaid Other | Admitting: Women's Health

## 2023-06-01 VITALS — BP 130/79 | HR 102 | Wt 197.8 lb

## 2023-06-01 DIAGNOSIS — O26893 Other specified pregnancy related conditions, third trimester: Secondary | ICD-10-CM

## 2023-06-01 DIAGNOSIS — Z3A30 30 weeks gestation of pregnancy: Secondary | ICD-10-CM

## 2023-06-01 DIAGNOSIS — N898 Other specified noninflammatory disorders of vagina: Secondary | ICD-10-CM

## 2023-06-01 DIAGNOSIS — Z3483 Encounter for supervision of other normal pregnancy, third trimester: Secondary | ICD-10-CM

## 2023-06-01 NOTE — Progress Notes (Signed)
LOW-RISK PREGNANCY VISIT Patient name: Bailey Palmer MRN [id number]  Date of birth: December 31, 1995 Chief Complaint:   Routine Prenatal Visit (Has questions about leakage after she pees)  History of Present Illness:   Bailey Palmer is a 27 y.o. G35P2002 female at [redacted]w[redacted]d with an Estimated Date of Delivery: 08/09/23 being seen today for ongoing management of a low-risk pregnancy.   Today she reports  for past few weeks has noticed leaking after urinating, sneezing, coughing, etc. No big gush.  . Denies abnormal discharge, itching/odor/irritation.   Contractions: Not present.  .  Movement: Present. reports leaking of fluid.     05/18/2023    9:06 AM 02/03/2023    2:26 PM 04/17/2021    8:48 AM 02/16/2020   11:26 AM 10/11/2019    1:43 PM  Depression screen PHQ 2/9  Decreased Interest 0 0 0 0 0  Down, Depressed, Hopeless 0 0 0 0 0  PHQ - 2 Score 0 0 0 0 0  Altered sleeping 1 1 1     Tired, decreased energy  0 0    Change in appetite 0 0 0    Feeling bad or failure about yourself  0 0 0    Trouble concentrating 0 0 0    Moving slowly or fidgety/restless 0 0 0    Suicidal thoughts 0 0 0    PHQ-9 Score 1 1 1     Difficult doing work/chores Not difficult at all            02/03/2023    2:26 PM 04/17/2021    8:48 AM  GAD 7 : Generalized Anxiety Score  Nervous, Anxious, on Edge 0 0  Control/stop worrying 0 0  Worry too much - different things 0 0  Trouble relaxing 0 0  Restless 0 0  Easily annoyed or irritable 0 0  Afraid - awful might happen 0 0  Total GAD 7 Score 0 0      Review of Systems:   Pertinent items are noted in HPI Denies abnormal vaginal discharge w/ itching/odor/irritation, headaches, visual changes, shortness of breath, chest pain, abdominal pain, severe nausea/vomiting, or problems with urination or bowel movements unless otherwise stated above. Pertinent History Reviewed:  Reviewed past medical,surgical, social, obstetrical and family history.  Reviewed problem list,  medications and allergies. Physical Assessment:   Vitals:   06/01/23 1620  BP: 130/79  Pulse: (!) 102  Weight: 197 lb 12.8 oz (89.7 kg)  Body mass index is 35.04 kg/m.        Physical Examination:   General appearance: Well appearing, and in no distress  Mental status: Alert, oriented to person, place, and time  Skin: Warm & dry  Cardiovascular: Normal heart rate noted  Respiratory: Normal respiratory effort, no distress  Abdomen: Soft, gravid, nontender  Pelvic:  SSE: cx visually closed, no pooling, no change w/ valsalva, fern & nitrazine neg. Scant amt white nonodorous d/c          Extremities: Edema: None  Fetal Status: Fetal Heart Rate (bpm): 153 Fundal Height: 30 cm Movement: Present    Chaperone: Latisha Cresenzo   No results found for this or any previous visit (from the past 24 hour(s)).  Assessment & Plan:  1) Low-risk pregnancy G3P2002 at [redacted]w[redacted]d with an Estimated Date of Delivery: 08/09/23   2) Leaking fluid, no evidence of ROM today (no pooling, neg fern/nitrazine), discussed can be urine or vag d/c. To continue to monitor, reviewed s/s ROM, seek  care to have re-evaluated if needed  3) H/O 9lb baby> plan EFW ~36wks    Meds: No orders of the defined types were placed in this encounter.  Labs/procedures today: spec exam and nitrazine, fern  Plan:  Continue routine obstetrical care  Next visit: prefers in person    Reviewed: Preterm labor symptoms and general obstetric precautions including but not limited to vaginal bleeding, contractions, leaking of fluid and fetal movement were reviewed in detail with the patient.  All questions were answered. Does have home bp cuff. Office bp cuff given: not applicable. Check bp weekly, let us know if consistently >140 and/or >90.  Follow-up: Return in about 2 weeks (around 06/15/2023) for LROB, CNM, in person.  No future appointments.  No orders of the defined types were placed in this encounter.  Cheral Marker CNM,  [facility] 06/01/2023 4:45 PM

## 2023-06-01 NOTE — Patient Instructions (Signed)
Bailey Palmer, thank you for choosing our office today! We appreciate the opportunity to meet your healthcare needs. You may receive a short survey by mail, e-mail, or through [employer]. If you are happy with your care we would appreciate if you could take just a few minutes to complete the survey questions. We read all of your comments and take your feedback very seriously. Thank you again for choosing our office.  Center for [employer] Team at [facility]  [facility] at [facility] ([address] [place], [place] [address]) Entrance C, located off of [address] Free 24/7 valet parking   CLASSES: Go to [email or web address] to register for classes (childbirth, breastfeeding, waterbirth, infant CPR, daddy bootcamp, etc.)  Call the office [phone]) or go to [facility] if: You begin to have strong, frequent contractions Your water breaks.  Sometimes it is a big gush of fluid, sometimes it is just a trickle that keeps getting your panties wet or running down your legs You have vaginal bleeding.  It is normal to have a small amount of spotting if your cervix was checked.  You don't feel your baby moving like normal.  If you don't, get you something to eat and drink and lay down and focus on feeling your baby move.   If your baby is still not moving like normal, you should call the office or go to [facility].  Call the office [phone]) or go to [facility] hospital for these signs of pre-eclampsia: Severe headache that does not go away with Tylenol Visual changes- seeing spots, double, blurred vision Pain under your right breast or upper abdomen that does not go away with Tums or heartburn medicine Nausea and/or vomiting Severe swelling in your hands, feet, and face   Tdap Vaccine It is recommended that you get the Tdap vaccine during the third trimester of EACH pregnancy to help protect your baby from getting pertussis (whooping cough) 27-36 weeks is the BEST time to do  this so that you can pass the protection on to your baby. During pregnancy is better than after pregnancy, but if you are unable to get it during pregnancy it will be offered at the hospital.  You can get this vaccine with [place], at the health department, your family doctor, or some local pharmacies Everyone who will be around your baby should also be up-to-date on their vaccines before the baby comes. Adults (who are not pregnant) only need 1 dose of Tdap during adulthood.   [facility] Pediatricians/Family Doctors [facility]): [address] Colette Ribas, [phone]           [facility]: [address], [phone]                [facility]): [address], [phone] (call to ask if accepting patients) [facility] Department: [address], [place], [phone]    [facility] Pediatricians/Family Doctors [facility]): [phone] [address], [phone] [facility]: [address] [place], [phone] [facility] [place]: [address], [phone]  [facility] Doctors  [facility]): [phone] [facility]: [address] [phone]   [facility] Doctors [facility]: [address], [phone]  [facility] Family Doctors  [facility]: [phone], [phone]  Home Blood Pressure Monitoring for Patients   Your provider has recommended that you check your  blood pressure (BP) at least once a week at home. If you do not have a blood pressure cuff at home, one will be provided for you. Contact your provider if you have not received your monitor within 1 week.   Helpful Tips for Accurate Home Blood Pressure Checks  Don't smoke, exercise, or drink caffeine 30 minutes before checking your BP Use the restroom before checking your BP (a full bladder can raise your  pressure) Relax in a comfortable upright chair Feet on the ground Left arm resting comfortably on a flat surface at the level of your heart Legs uncrossed Back supported Sit quietly and don't talk Place the cuff on your bare arm Adjust snuggly, so that only two fingertips can fit between your skin and the top of the cuff Check 2 readings separated by at least one minute Keep a log of your BP readings For a visual, please reference this diagram: http://ccnc.care/bpdiagram  Provider Name: [facility]     Phone: [phone]  Zone 1: ALL CLEAR  Continue to monitor your symptoms:  BP reading is less than 140 (top number) or less than 90 (bottom number)  No right upper stomach pain No headaches or seeing spots No feeling nauseated or throwing up No swelling in face and hands  Zone 2: CAUTION Call your doctor's office for any of the following:  BP reading is greater than 140 (top number) or greater than 90 (bottom number)  Stomach pain under your ribs in the middle or right side Headaches or seeing spots Feeling nauseated or throwing up Swelling in face and hands  Zone 3: EMERGENCY  Seek immediate medical care if you have any of the following:  BP reading is greater than160 (top number) or greater than 110 (bottom number) Severe headaches not improving with Tylenol Serious difficulty catching your breath Any worsening symptoms from Zone 2  Preterm Labor and Birth Information  The normal length of a pregnancy is 39-41 weeks. Preterm labor is when labor starts before 37 completed weeks of pregnancy. What are the risk factors for preterm labor? Preterm labor is more likely to occur in women who: Have certain infections during pregnancy such as a bladder infection, sexually transmitted infection, or infection inside the uterus (chorioamnionitis). Have a shorter-than-normal cervix. Have gone into preterm labor before. Have had surgery on their cervix. Are younger than age 17  or older than age 35. Are African American. Are pregnant with twins or multiple babies (multiple gestation). Take street drugs or smoke while pregnant. Do not gain enough weight while pregnant. Became pregnant shortly after having been pregnant. What are the symptoms of preterm labor? Symptoms of preterm labor include: Cramps similar to those that can happen during a menstrual period. The cramps may happen with diarrhea. Pain in the abdomen or lower back. Regular uterine contractions that may feel like tightening of the abdomen. A feeling of increased pressure in the pelvis. Increased watery or bloody mucus discharge from the vagina. Water breaking (ruptured amniotic sac). Why is it important to recognize signs of preterm labor? It is important to recognize signs of preterm labor because babies who are born prematurely may not be fully developed. This can put them at an increased risk for: Long-term (chronic) heart and lung problems. Difficulty immediately after birth with regulating body systems, including blood sugar, body temperature, heart rate, and breathing rate. Bleeding in the brain. Cerebral palsy. Learning difficulties. Death. These risks are highest for babies who are born before 34 weeks   of pregnancy. How is preterm labor treated? Treatment depends on the length of your pregnancy, your condition, and the health of your baby. It may involve: Having a stitch (suture) placed in your cervix to prevent your cervix from opening too early (cerclage). Taking or being given medicines, such as: Hormone medicines. These may be given early in pregnancy to help support the pregnancy. Medicine to stop contractions. Medicines to help mature the baby's lungs. These may be prescribed if the risk of delivery is high. Medicines to prevent your baby from developing cerebral palsy. If the labor happens before 34 weeks of pregnancy, you may need to stay in the hospital. What should I do if I  think I am in preterm labor? If you think that you are going into preterm labor, call your health care provider right away. How can I prevent preterm labor in future pregnancies? To increase your chance of having a full-term pregnancy: Do not use any tobacco products, such as cigarettes, chewing tobacco, and e-cigarettes. If you need help quitting, ask your health care provider. Do not use street drugs or medicines that have not been prescribed to you during your pregnancy. Talk with your health care provider before taking any herbal supplements, even if you have been taking them regularly. Make sure you gain a healthy amount of weight during your pregnancy. Watch for infection. If you think that you might have an infection, get it checked right away. Make sure to tell your health care provider if you have gone into preterm labor before. This information is not intended to replace advice given to you by your health care provider. Make sure you discuss any questions you have with your health care provider. Document Revised: 03/18/2019 Document Reviewed: 04/16/2016 Elsevier Patient Education  2020 Elsevier Inc.   

## 2023-06-16 ENCOUNTER — Encounter: Payer: Medicaid Other | Admitting: Women's Health

## 2023-06-29 ENCOUNTER — Ambulatory Visit (INDEPENDENT_AMBULATORY_CARE_PROVIDER_SITE_OTHER): Payer: Medicaid Other | Admitting: Women's Health

## 2023-06-29 ENCOUNTER — Encounter: Payer: Self-pay | Admitting: Women's Health

## 2023-06-29 VITALS — BP 113/73 | HR 115 | Wt 202.6 lb

## 2023-06-29 DIAGNOSIS — Z3A34 34 weeks gestation of pregnancy: Secondary | ICD-10-CM

## 2023-06-29 DIAGNOSIS — Z3483 Encounter for supervision of other normal pregnancy, third trimester: Secondary | ICD-10-CM

## 2023-06-29 DIAGNOSIS — Z8759 Personal history of other complications of pregnancy, childbirth and the puerperium: Secondary | ICD-10-CM

## 2023-06-29 NOTE — Patient Instructions (Signed)
Bailey Palmer, thank you for choosing our office today! We appreciate the opportunity to meet your healthcare needs. You may receive a short survey by mail, e-mail, or through [employer]. If you are happy with your care we would appreciate if you could take just a few minutes to complete the survey questions. We read all of your comments and take your feedback very seriously. Thank you again for choosing our office.  Center for [employer] Team at [facility]  [facility] at [facility] ([address] [place], [place] [address]) Entrance C, located off of [address] Free 24/7 valet parking   CLASSES: Go to [email or web address] to register for classes (childbirth, breastfeeding, waterbirth, infant CPR, daddy bootcamp, etc.)  Call the office [phone]) or go to [facility] if: You begin to have strong, frequent contractions Your water breaks.  Sometimes it is a big gush of fluid, sometimes it is just a trickle that keeps getting your panties wet or running down your legs You have vaginal bleeding.  It is normal to have a small amount of spotting if your cervix was checked.  You don't feel your baby moving like normal.  If you don't, get you something to eat and drink and lay down and focus on feeling your baby move.   If your baby is still not moving like normal, you should call the office or go to [facility].  Call the office [phone]) or go to [facility] hospital for these signs of pre-eclampsia: Severe headache that does not go away with Tylenol Visual changes- seeing spots, double, blurred vision Pain under your right breast or upper abdomen that does not go away with Tums or heartburn medicine Nausea and/or vomiting Severe swelling in your hands, feet, and face   Tdap Vaccine It is recommended that you get the Tdap vaccine during the third trimester of EACH pregnancy to help protect your baby from getting pertussis (whooping cough) 27-36 weeks is the BEST time to do  this so that you can pass the protection on to your baby. During pregnancy is better than after pregnancy, but if you are unable to get it during pregnancy it will be offered at the hospital.  You can get this vaccine with [place], at the health department, your family doctor, or some local pharmacies Everyone who will be around your baby should also be up-to-date on their vaccines before the baby comes. Adults (who are not pregnant) only need 1 dose of Tdap during adulthood.   [facility] Pediatricians/Family Doctors [facility]): [address] Colette Ribas, [phone]           [facility]: [address], [phone]                [facility]): [address], [phone] (call to ask if accepting patients) [facility] Department: [address], [place], [phone]    [facility] Pediatricians/Family Doctors [facility]): [phone] [address], [phone] [facility]: [address] [place], [phone] [facility] [place]: [address], [phone]  [facility] Doctors  [facility]): [phone] [facility]: [address] [phone]   [facility] Doctors [facility]: [address], [phone]  [facility] Family Doctors  [facility]: [phone], [phone]  Home Blood Pressure Monitoring for Patients   Your provider has recommended that you check your  blood pressure (BP) at least once a week at home. If you do not have a blood pressure cuff at home, one will be provided for you. Contact your provider if you have not received your monitor within 1 week.   Helpful Tips for Accurate Home Blood Pressure Checks  Don't smoke, exercise, or drink caffeine 30 minutes before checking your BP Use the restroom before checking your BP (a full bladder can raise your  pressure) Relax in a comfortable upright chair Feet on the ground Left arm resting comfortably on a flat surface at the level of your heart Legs uncrossed Back supported Sit quietly and don't talk Place the cuff on your bare arm Adjust snuggly, so that only two fingertips can fit between your skin and the top of the cuff Check 2 readings separated by at least one minute Keep a log of your BP readings For a visual, please reference this diagram: http://ccnc.care/bpdiagram  Provider Name: [facility]     Phone: [phone]  Zone 1: ALL CLEAR  Continue to monitor your symptoms:  BP reading is less than 140 (top number) or less than 90 (bottom number)  No right upper stomach pain No headaches or seeing spots No feeling nauseated or throwing up No swelling in face and hands  Zone 2: CAUTION Call your doctor's office for any of the following:  BP reading is greater than 140 (top number) or greater than 90 (bottom number)  Stomach pain under your ribs in the middle or right side Headaches or seeing spots Feeling nauseated or throwing up Swelling in face and hands  Zone 3: EMERGENCY  Seek immediate medical care if you have any of the following:  BP reading is greater than160 (top number) or greater than 110 (bottom number) Severe headaches not improving with Tylenol Serious difficulty catching your breath Any worsening symptoms from Zone 2  Preterm Labor and Birth Information  The normal length of a pregnancy is 39-41 weeks. Preterm labor is when labor starts before 37 completed weeks of pregnancy. What are the risk factors for preterm labor? Preterm labor is more likely to occur in women who: Have certain infections during pregnancy such as a bladder infection, sexually transmitted infection, or infection inside the uterus (chorioamnionitis). Have a shorter-than-normal cervix. Have gone into preterm labor before. Have had surgery on their cervix. Are younger than age 17  or older than age 35. Are African American. Are pregnant with twins or multiple babies (multiple gestation). Take street drugs or smoke while pregnant. Do not gain enough weight while pregnant. Became pregnant shortly after having been pregnant. What are the symptoms of preterm labor? Symptoms of preterm labor include: Cramps similar to those that can happen during a menstrual period. The cramps may happen with diarrhea. Pain in the abdomen or lower back. Regular uterine contractions that may feel like tightening of the abdomen. A feeling of increased pressure in the pelvis. Increased watery or bloody mucus discharge from the vagina. Water breaking (ruptured amniotic sac). Why is it important to recognize signs of preterm labor? It is important to recognize signs of preterm labor because babies who are born prematurely may not be fully developed. This can put them at an increased risk for: Long-term (chronic) heart and lung problems. Difficulty immediately after birth with regulating body systems, including blood sugar, body temperature, heart rate, and breathing rate. Bleeding in the brain. Cerebral palsy. Learning difficulties. Death. These risks are highest for babies who are born before 34 weeks   of pregnancy. How is preterm labor treated? Treatment depends on the length of your pregnancy, your condition, and the health of your baby. It may involve: Having a stitch (suture) placed in your cervix to prevent your cervix from opening too early (cerclage). Taking or being given medicines, such as: Hormone medicines. These may be given early in pregnancy to help support the pregnancy. Medicine to stop contractions. Medicines to help mature the baby's lungs. These may be prescribed if the risk of delivery is high. Medicines to prevent your baby from developing cerebral palsy. If the labor happens before 34 weeks of pregnancy, you may need to stay in the hospital. What should I do if I  think I am in preterm labor? If you think that you are going into preterm labor, call your health care provider right away. How can I prevent preterm labor in future pregnancies? To increase your chance of having a full-term pregnancy: Do not use any tobacco products, such as cigarettes, chewing tobacco, and e-cigarettes. If you need help quitting, ask your health care provider. Do not use street drugs or medicines that have not been prescribed to you during your pregnancy. Talk with your health care provider before taking any herbal supplements, even if you have been taking them regularly. Make sure you gain a healthy amount of weight during your pregnancy. Watch for infection. If you think that you might have an infection, get it checked right away. Make sure to tell your health care provider if you have gone into preterm labor before. This information is not intended to replace advice given to you by your health care provider. Make sure you discuss any questions you have with your health care provider. Document Revised: 03/18/2019 Document Reviewed: 04/16/2016 Elsevier Patient Education  2020 Elsevier Inc.   

## 2023-06-29 NOTE — Progress Notes (Signed)
LOW-RISK PREGNANCY VISIT Patient name: Bailey Palmer MRN [id number]  Date of birth: 1996/10/31 Chief Complaint:   Initial Prenatal Visit  History of Present Illness:   Bailey Palmer is a 27 y.o. G77P2002 female at [redacted]w[redacted]d with an Estimated Date of Delivery: 08/09/23 being seen today for ongoing management of a low-risk pregnancy.   Today she reports swelling. Contractions: Irritability. Vag. Bleeding: None.  Movement: Present. denies leaking of fluid.     05/18/2023    9:06 AM 02/03/2023    2:26 PM 04/17/2021    8:48 AM 02/16/2020   11:26 AM 10/11/2019    1:43 PM  Depression screen PHQ 2/9  Decreased Interest 0 0 0 0 0  Down, Depressed, Hopeless 0 0 0 0 0  PHQ - 2 Score 0 0 0 0 0  Altered sleeping 1 1 1     Tired, decreased energy  0 0    Change in appetite 0 0 0    Feeling bad or failure about yourself  0 0 0    Trouble concentrating 0 0 0    Moving slowly or fidgety/restless 0 0 0    Suicidal thoughts 0 0 0    PHQ-9 Score 1 1 1     Difficult doing work/chores Not difficult at all            02/03/2023    2:26 PM 04/17/2021    8:48 AM  GAD 7 : Generalized Anxiety Score  Nervous, Anxious, on Edge 0 0  Control/stop worrying 0 0  Worry too much - different things 0 0  Trouble relaxing 0 0  Restless 0 0  Easily annoyed or irritable 0 0  Afraid - awful might happen 0 0  Total GAD 7 Score 0 0      Review of Systems:   Pertinent items are noted in HPI Denies abnormal vaginal discharge w/ itching/odor/irritation, headaches, visual changes, shortness of breath, chest pain, abdominal pain, severe nausea/vomiting, or problems with urination or bowel movements unless otherwise stated above. Pertinent History Reviewed:  Reviewed past medical,surgical, social, obstetrical and family history.  Reviewed problem list, medications and allergies. Physical Assessment:   Vitals:   06/29/23 1507  BP: 113/73  Pulse: (!) 115  Weight: 202 lb 9.6 oz (91.9 kg)  Body mass index is 35.89  kg/m.        Physical Examination:   General appearance: Well appearing, and in no distress  Mental status: Alert, oriented to person, place, and time  Skin: Warm & dry  Cardiovascular: Normal heart rate noted  Respiratory: Normal respiratory effort, no distress  Abdomen: Soft, gravid, nontender  Pelvic: Cervical exam deferred         Extremities: Edema: Trace  Fetal Status: Fetal Heart Rate (bpm): 140 Fundal Height: 34 cm Movement: Present    Chaperone: N/A   No results found for this or any previous visit (from the past 24 hour(s)).  Assessment & Plan:  1) Low-risk pregnancy G3P2002 at [redacted]w[redacted]d with an Estimated Date of Delivery: 08/09/23   2) H/O 9lb baby, plan EFW at one of upcoming appts   Meds: No orders of the defined types were placed in this encounter.  Labs/procedures today: none  Plan:  Continue routine obstetrical care  Next visit: prefers in person    Reviewed: Preterm labor symptoms and general obstetric precautions including but not limited to vaginal bleeding, contractions, leaking of fluid and fetal movement were reviewed in detail with the patient.  All  questions were answered. Does have home bp cuff. Office bp cuff given: not applicable. Check bp weekly, let us know if consistently >140 and/or >90.  Follow-up: Return for efw u/s w/ 8/5 or 8/13 appt.  Future Appointments  Date Time Provider Department Center  07/13/2023  4:10 PM Cheral Marker, [place] [facility]  07/21/2023  4:10 PM Cheral Marker, CNM [facility]  07/27/2023  4:10 PM Cheral Marker, CNM [facility]  08/03/2023  4:10 PM Cheral Marker, CNM [facility]    Orders Placed This Encounter  Procedures   US OB Follow Up   Cheral Marker CNM, [facility] 06/29/2023 3:44 PM

## 2023-07-13 ENCOUNTER — Encounter: Payer: Medicaid Other | Admitting: Women's Health

## 2023-07-14 ENCOUNTER — Other Ambulatory Visit (HOSPITAL_COMMUNITY)
Admission: RE | Admit: 2023-07-14 | Discharge: 2023-07-14 | Disposition: A | Discharge status: Home / Self Care | Payer: Medicaid Other | Source: Ambulatory Visit | Attending: Women's Health | Admitting: Women's Health

## 2023-07-14 ENCOUNTER — Ambulatory Visit (INDEPENDENT_AMBULATORY_CARE_PROVIDER_SITE_OTHER): Payer: Medicaid Other | Admitting: Women's Health

## 2023-07-14 ENCOUNTER — Encounter: Payer: Self-pay | Admitting: Women's Health

## 2023-07-14 VITALS — BP 138/81 | HR 115 | Wt 198.4 lb

## 2023-07-14 DIAGNOSIS — Z3483 Encounter for supervision of other normal pregnancy, third trimester: Secondary | ICD-10-CM | POA: Diagnosis not present

## 2023-07-14 DIAGNOSIS — O368330 Maternal care for abnormalities of the fetal heart rate or rhythm, third trimester, not applicable or unspecified: Secondary | ICD-10-CM

## 2023-07-14 DIAGNOSIS — Z3A36 36 weeks gestation of pregnancy: Secondary | ICD-10-CM | POA: Insufficient documentation

## 2023-07-14 DIAGNOSIS — O09293 Supervision of pregnancy with other poor reproductive or obstetric history, third trimester: Secondary | ICD-10-CM

## 2023-07-14 DIAGNOSIS — O368390 Maternal care for abnormalities of the fetal heart rate or rhythm, unspecified trimester, not applicable or unspecified: Secondary | ICD-10-CM

## 2023-07-14 NOTE — Patient Instructions (Signed)
Bailey Palmer, thank you for choosing our office today! We appreciate the opportunity to meet your healthcare needs. You may receive a short survey by mail, e-mail, or through [employer]. If you are happy with your care we would appreciate if you could take just a few minutes to complete the survey questions. We read all of your comments and take your feedback very seriously. Thank you again for choosing our office.  Center for [employer] Team at [facility]  [facility] at [facility] ([address] [place], [place] [address]) Entrance C, located off of [address] Free 24/7 valet parking   CLASSES: Go to [email or web address] to register for classes (childbirth, breastfeeding, waterbirth, infant CPR, daddy bootcamp, etc.)  Call the office [phone]) or go to [facility] if: You begin to have strong, frequent contractions Your water breaks.  Sometimes it is a big gush of fluid, sometimes it is just a trickle that keeps getting your panties wet or running down your legs You have vaginal bleeding.  It is normal to have a small amount of spotting if your cervix was checked.  You don't feel your baby moving like normal.  If you don't, get you something to eat and drink and lay down and focus on feeling your baby move.   If your baby is still not moving like normal, you should call the office or go to [facility].  Call the office [phone]) or go to [facility] hospital for these signs of pre-eclampsia: Severe headache that does not go away with Tylenol Visual changes- seeing spots, double, blurred vision Pain under your right breast or upper abdomen that does not go away with Tums or heartburn medicine Nausea and/or vomiting Severe swelling in your hands, feet, and face   Tdap Vaccine It is recommended that you get the Tdap vaccine during the third trimester of EACH pregnancy to help protect your baby from getting pertussis (whooping cough) 27-36 weeks is the BEST time to do  this so that you can pass the protection on to your baby. During pregnancy is better than after pregnancy, but if you are unable to get it during pregnancy it will be offered at the hospital.  You can get this vaccine with [place], at the health department, your family doctor, or some local pharmacies Everyone who will be around your baby should also be up-to-date on their vaccines before the baby comes. Adults (who are not pregnant) only need 1 dose of Tdap during adulthood.   [facility] Pediatricians/Family Doctors [facility]): [address] Colette Ribas, [phone]           [facility]: [address], [phone]                [facility]): [address], [phone] (call to ask if accepting patients) [facility] Department: [address], [place], [phone]    [facility] Pediatricians/Family Doctors [facility]): [phone] [address], [phone] [facility]: [address] [place], [phone] [facility] [place]: [address], [phone]  [facility] Doctors  [facility]): [phone] [facility]: [address] [phone]   [facility] Doctors [facility]: [address], [phone]  [facility] Family Doctors  [facility]: [phone], [phone]  Home Blood Pressure Monitoring for Patients   Your provider has recommended that you check your  blood pressure (BP) at least once a week at home. If you do not have a blood pressure cuff at home, one will be provided for you. Contact your provider if you have not received your monitor within 1 week.   Helpful Tips for Accurate Home Blood Pressure Checks  Don't smoke, exercise, or drink caffeine 30 minutes before checking your BP Use the restroom before checking your BP (a full bladder can raise your  pressure) Relax in a comfortable upright chair Feet on the ground Left arm resting comfortably on a flat surface at the level of your heart Legs uncrossed Back supported Sit quietly and don't talk Place the cuff on your bare arm Adjust snuggly, so that only two fingertips can fit between your skin and the top of the cuff Check 2 readings separated by at least one minute Keep a log of your BP readings For a visual, please reference this diagram: http://ccnc.care/bpdiagram  Provider Name: [facility]     Phone: [phone]  Zone 1: ALL CLEAR  Continue to monitor your symptoms:  BP reading is less than 140 (top number) or less than 90 (bottom number)  No right upper stomach pain No headaches or seeing spots No feeling nauseated or throwing up No swelling in face and hands  Zone 2: CAUTION Call your doctor's office for any of the following:  BP reading is greater than 140 (top number) or greater than 90 (bottom number)  Stomach pain under your ribs in the middle or right side Headaches or seeing spots Feeling nauseated or throwing up Swelling in face and hands  Zone 3: EMERGENCY  Seek immediate medical care if you have any of the following:  BP reading is greater than160 (top number) or greater than 110 (bottom number) Severe headaches not improving with Tylenol Serious difficulty catching your breath Any worsening symptoms from Zone 2  Preterm Labor and Birth Information  The normal length of a pregnancy is 39-41 weeks. Preterm labor is when labor starts before 37 completed weeks of pregnancy. What are the risk factors for preterm labor? Preterm labor is more likely to occur in women who: Have certain infections during pregnancy such as a bladder infection, sexually transmitted infection, or infection inside the uterus (chorioamnionitis). Have a shorter-than-normal cervix. Have gone into preterm labor before. Have had surgery on their cervix. Are younger than age 17  or older than age 35. Are African American. Are pregnant with twins or multiple babies (multiple gestation). Take street drugs or smoke while pregnant. Do not gain enough weight while pregnant. Became pregnant shortly after having been pregnant. What are the symptoms of preterm labor? Symptoms of preterm labor include: Cramps similar to those that can happen during a menstrual period. The cramps may happen with diarrhea. Pain in the abdomen or lower back. Regular uterine contractions that may feel like tightening of the abdomen. A feeling of increased pressure in the pelvis. Increased watery or bloody mucus discharge from the vagina. Water breaking (ruptured amniotic sac). Why is it important to recognize signs of preterm labor? It is important to recognize signs of preterm labor because babies who are born prematurely may not be fully developed. This can put them at an increased risk for: Long-term (chronic) heart and lung problems. Difficulty immediately after birth with regulating body systems, including blood sugar, body temperature, heart rate, and breathing rate. Bleeding in the brain. Cerebral palsy. Learning difficulties. Death. These risks are highest for babies who are born before 34 weeks   of pregnancy. How is preterm labor treated? Treatment depends on the length of your pregnancy, your condition, and the health of your baby. It may involve: Having a stitch (suture) placed in your cervix to prevent your cervix from opening too early (cerclage). Taking or being given medicines, such as: Hormone medicines. These may be given early in pregnancy to help support the pregnancy. Medicine to stop contractions. Medicines to help mature the baby's lungs. These may be prescribed if the risk of delivery is high. Medicines to prevent your baby from developing cerebral palsy. If the labor happens before 34 weeks of pregnancy, you may need to stay in the hospital. What should I do if I  think I am in preterm labor? If you think that you are going into preterm labor, call your health care provider right away. How can I prevent preterm labor in future pregnancies? To increase your chance of having a full-term pregnancy: Do not use any tobacco products, such as cigarettes, chewing tobacco, and e-cigarettes. If you need help quitting, ask your health care provider. Do not use street drugs or medicines that have not been prescribed to you during your pregnancy. Talk with your health care provider before taking any herbal supplements, even if you have been taking them regularly. Make sure you gain a healthy amount of weight during your pregnancy. Watch for infection. If you think that you might have an infection, get it checked right away. Make sure to tell your health care provider if you have gone into preterm labor before. This information is not intended to replace advice given to you by your health care provider. Make sure you discuss any questions you have with your health care provider. Document Revised: 03/18/2019 Document Reviewed: 04/16/2016 Elsevier Patient Education  2020 Elsevier Inc.   

## 2023-07-14 NOTE — Progress Notes (Signed)
LOW-RISK PREGNANCY VISIT Patient name: Bailey Palmer MRN [id number]  Date of birth: [birth year] Chief Complaint:   Routine Prenatal Visit (Cultures today)  History of Present Illness:   Bailey Palmer is a 27 y.o. G69P2002 female at [redacted]w[redacted]d with an Estimated Date of Delivery: 08/09/23 being seen today for ongoing management of a low-risk pregnancy.   Today she reports no complaints. Contractions: Irregular. Vag. Bleeding: None.  Movement: Present. denies leaking of fluid.     05/18/2023    9:06 AM 02/03/2023    2:26 PM 04/17/2021    8:48 AM 02/16/2020   11:26 AM 10/11/2019    1:43 PM  Depression screen PHQ 2/9  Decreased Interest 0 0 0 0 0  Down, Depressed, Hopeless 0 0 0 0 0  PHQ - 2 Score 0 0 0 0 0  Altered sleeping 1 1 1     Tired, decreased energy  0 0    Change in appetite 0 0 0    Feeling bad or failure about yourself  0 0 0    Trouble concentrating 0 0 0    Moving slowly or fidgety/restless 0 0 0    Suicidal thoughts 0 0 0    PHQ-9 Score 1 1 1     Difficult doing work/chores Not difficult at all            02/03/2023    2:26 PM 04/17/2021    8:48 AM  GAD 7 : Generalized Anxiety Score  Nervous, Anxious, on Edge 0 0  Control/stop worrying 0 0  Worry too much - different things 0 0  Trouble relaxing 0 0  Restless 0 0  Easily annoyed or irritable 0 0  Afraid - awful might happen 0 0  Total GAD 7 Score 0 0      Review of Systems:   Pertinent items are noted in HPI Denies abnormal vaginal discharge w/ itching/odor/irritation, headaches, visual changes, shortness of breath, chest pain, abdominal pain, severe nausea/vomiting, or problems with urination or bowel movements unless otherwise stated above. Pertinent History Reviewed:  Reviewed past medical,surgical, social, obstetrical and family history.  Reviewed problem list, medications and allergies. Physical Assessment:   Vitals:   07/14/23 1112  BP: 138/81  Pulse: (!) 115  Weight: 198 lb 6.4 oz (90 kg)  Body  mass index is 35.14 kg/m.        Physical Examination:   General appearance: Well appearing, and in no distress  Mental status: Alert, oriented to person, place, and time  Skin: Warm & dry  Cardiovascular: Normal heart rate noted  Respiratory: Normal respiratory effort, no distress  Abdomen: Soft, gravid, nontender  Pelvic: Cervical exam performed , Rt vulva edema/varicosities Dilation: 1 Effacement (%): Thick Station: Ballotable  Extremities: Edema: None  Fetal Status: Fetal Heart Rate (bpm): 180 Fundal Height: 36 cm Movement: Present Presentation: Vertex NST (for [facility]): FHR baseline 155 bpm, Variability: moderate, Accelerations:present, Decelerations:  Absent= Cat 1/reactive Toco: UI   Chaperone:  Sherri Kaywood    No results found for this or any previous visit (from the past 24 hour(s)).  Assessment & Plan:  1) Low-risk pregnancy G3P2002 at [redacted]w[redacted]d with an Estimated Date of Delivery: 08/09/23   2) H/O fetal macrosomia, EFW next week as scheduled  3) Fetal tachycardia> on doppler, NST reactive and normal baseline   Meds: No orders of the defined types were placed in this encounter.  Labs/procedures today: GBS, GC/CT, SVE, and NST  Plan:  Continue routine obstetrical  care  Next visit: prefers will be in person for u/s     Reviewed: Preterm labor symptoms and general obstetric precautions including but not limited to vaginal bleeding, contractions, leaking of fluid and fetal movement were reviewed in detail with the patient.  All questions were answered. Does have home bp cuff. Office bp cuff given: not applicable. Check bp weekly, let us know if consistently >140 and/or >90.  Follow-up: Return for As scheduled.  Future Appointments  Date Time Provider Department Center  07/21/2023 10:00 AM [facility] [place] [facility] None  07/21/2023 10:50 AM Cheral Marker, CNM [facility]  07/27/2023  4:10 PM Cheral Marker, CNM [facility]  08/03/2023  4:10 PM Cheral Marker,  CNM [facility]  08/11/2023  9:50 AM Cheral Marker, CNM [facility]  08/11/2023  9:50 AM [facility] NURSE [facility]    Orders Placed This Encounter  Procedures   Culture, beta strep (group b only)   Cheral Marker CNM, [facility]NP-BC 07/14/2023 12:12 PM

## 2023-07-20 ENCOUNTER — Other Ambulatory Visit: Payer: Medicaid Other

## 2023-07-20 ENCOUNTER — Encounter: Payer: Medicaid Other | Admitting: Obstetrics & Gynecology

## 2023-07-21 ENCOUNTER — Ambulatory Visit (INDEPENDENT_AMBULATORY_CARE_PROVIDER_SITE_OTHER): Payer: Medicaid Other | Admitting: Women's Health

## 2023-07-21 ENCOUNTER — Encounter: Payer: Medicaid Other | Admitting: Women's Health

## 2023-07-21 ENCOUNTER — Ambulatory Visit (INDEPENDENT_AMBULATORY_CARE_PROVIDER_SITE_OTHER): Payer: Medicaid Other

## 2023-07-21 ENCOUNTER — Encounter: Payer: Self-pay | Admitting: Women's Health

## 2023-07-21 VITALS — BP 119/79 | HR 93 | Wt 199.0 lb

## 2023-07-21 DIAGNOSIS — Z8759 Personal history of other complications of pregnancy, childbirth and the puerperium: Secondary | ICD-10-CM | POA: Diagnosis not present

## 2023-07-21 DIAGNOSIS — Z3483 Encounter for supervision of other normal pregnancy, third trimester: Secondary | ICD-10-CM

## 2023-07-21 DIAGNOSIS — I863 Vulval varices: Secondary | ICD-10-CM

## 2023-07-21 DIAGNOSIS — Z3480 Encounter for supervision of other normal pregnancy, unspecified trimester: Secondary | ICD-10-CM

## 2023-07-21 NOTE — Progress Notes (Signed)
[place] 37+2 wks,cephalic,right lateral placenta gr 3,FHR 132 bpm,AFI 16 cm,EFW 3326 g 72%

## 2023-07-21 NOTE — Patient Instructions (Signed)
Bailey Palmer, thank you for choosing our office today! We appreciate the opportunity to meet your healthcare needs. You may receive a short survey by mail, e-mail, or through [employer]. If you are happy with your care we would appreciate if you could take just a few minutes to complete the survey questions. We read all of your comments and take your feedback very seriously. Thank you again for choosing our office.  Center for [employer] Team at [facility]  [facility] at [facility] ([address] [place], [place] [address]) Entrance C, located off of [address] Free 24/7 valet parking   CLASSES: Go to [email or web address] to register for classes (childbirth, breastfeeding, waterbirth, infant CPR, daddy bootcamp, etc.)  Call the office [phone]) or go to [facility] if: You begin to have strong, frequent contractions Your water breaks.  Sometimes it is a big gush of fluid, sometimes it is just a trickle that keeps getting your panties wet or running down your legs You have vaginal bleeding.  It is normal to have a small amount of spotting if your cervix was checked.  You don't feel your baby moving like normal.  If you don't, get you something to eat and drink and lay down and focus on feeling your baby move.   If your baby is still not moving like normal, you should call the office or go to [facility].  Call the office [phone]) or go to [facility] hospital for these signs of pre-eclampsia: Severe headache that does not go away with Tylenol Visual changes- seeing spots, double, blurred vision Pain under your right breast or upper abdomen that does not go away with Tums or heartburn medicine Nausea and/or vomiting Severe swelling in your hands, feet, and face   [facility] Pediatricians/Family Doctors [facility]): [address] Colette Ribas, [phone]           [facility]: [address], [phone]                 [facility]): [address], [phone] (call to ask if accepting patients) [facility] Department: [address], [place], [phone]    [facility] Pediatricians/Family Doctors [facility]): [address], [phone] [facility]: [address] [place], [phone] [facility]: [address], [phone]  [facility] Doctors  [facility]): [phone] [facility]: [address], [phone]   [facility] Doctors [facility]: [address], [phone]  [facility] Doctors  [facility]: [phone], [phone]  Home Blood Pressure Monitoring for Patients   Your provider has recommended that you check your blood pressure (BP) at least once a week at home. If you do not have a blood pressure cuff at home, one will be provided for you. Contact your provider if you have not received your monitor within 1 week.   Helpful Tips for Accurate Home Blood Pressure Checks  Don't smoke, exercise, or drink caffeine 30 minutes before checking your BP Use the restroom before checking your BP (a full bladder can raise your pressure) Relax in a comfortable upright chair Feet on the ground Left arm resting comfortably on a flat surface at the level of your heart Legs uncrossed Back supported Sit quietly and don't talk Place the cuff on your bare arm Adjust snuggly, so that only two fingertips  can fit between your skin and the top of the cuff Check 2 readings separated by at least one minute Keep a log of your BP readings For a visual, please reference this diagram: http://ccnc.care/bpdiagram  Provider Name: [facility]     Phone: [phone]  Zone 1: ALL CLEAR  Continue to monitor your symptoms:  BP reading is less than 140 (top number) or less than 90 (bottom number)  No right  upper stomach pain No headaches or seeing spots No feeling nauseated or throwing up No swelling in face and hands  Zone 2: CAUTION Call your doctor's office for any of the following:  BP reading is greater than 140 (top number) or greater than 90 (bottom number)  Stomach pain under your ribs in the middle or right side Headaches or seeing spots Feeling nauseated or throwing up Swelling in face and hands  Zone 3: EMERGENCY  Seek immediate medical care if you have any of the following:  BP reading is greater than160 (top number) or greater than 110 (bottom number) Severe headaches not improving with Tylenol Serious difficulty catching your breath Any worsening symptoms from Zone 2   Braxton Hicks Contractions Contractions of the uterus can occur throughout pregnancy, but they are not always a sign that you are in labor. You may have practice contractions called Braxton Hicks contractions. These false labor contractions are sometimes confused with true labor. What are [name] contractions? Braxton Hicks contractions are tightening movements that occur in the muscles of the uterus before labor. Unlike true labor contractions, these contractions do not result in opening (dilation) and thinning of the cervix. Toward the end of pregnancy (32-34 weeks), Braxton Hicks contractions can happen more often and may become stronger. These contractions are sometimes difficult to tell apart from true labor because they can be very uncomfortable. You should not feel embarrassed if you go to the hospital with false labor. Sometimes, the only way to tell if you are in true labor is for your health care provider to look for changes in the cervix. The health care provider will do a physical exam and may monitor your contractions. If you are not in true labor, the exam should show that your cervix is not dilating and your water has not broken. If there are no other health problems associated with your  pregnancy, it is completely safe for you to be sent home with false labor. You may continue to have Braxton Hicks contractions until you go into true labor. How to tell the difference between true labor and false labor True labor Contractions last 30-70 seconds. Contractions become very regular. Discomfort is usually felt in the top of the uterus, and it spreads to the lower abdomen and low back. Contractions do not go away with walking. Contractions usually become more intense and increase in frequency. The cervix dilates and gets thinner. False labor Contractions are usually shorter and not as strong as true labor contractions. Contractions are usually irregular. Contractions are often felt in the front of the lower abdomen and in the groin. Contractions may go away when you walk around or change positions while lying down. Contractions get weaker and are shorter-lasting as time goes on. The cervix usually does not dilate or become thin. Follow these instructions at home:  Take over-the-counter and prescription medicines only as told by your health care provider. Keep up with your usual exercises and follow other instructions from your health care provider. Eat and drink lightly if you think  you are going into labor. If Braxton Hicks contractions are making you uncomfortable: Change your position from lying down or resting to walking, or change from walking to resting. Sit and rest in a tub of warm water. Drink enough fluid to keep your urine pale yellow. Dehydration may cause these contractions. Do slow and deep breathing several times an hour. Keep all follow-up prenatal visits as told by your health care provider. This is important. Contact a health care provider if: You have a fever. You have continuous pain in your abdomen. Get help right away if: Your contractions become stronger, more regular, and closer together. You have fluid leaking or gushing from your vagina. You pass  blood-tinged mucus (bloody show). You have bleeding from your vagina. You have low back pain that you never had before. You feel your baby's head pushing down and causing pelvic pressure. Your baby is not moving inside you as much as it used to. Summary Contractions that occur before labor are called Braxton Hicks contractions, false labor, or practice contractions. Braxton Hicks contractions are usually shorter, weaker, farther apart, and less regular than true labor contractions. True labor contractions usually become progressively stronger and regular, and they become more frequent. Manage discomfort from [facility] contractions by changing position, resting in a warm bath, drinking plenty of water, or practicing deep breathing. This information is not intended to replace advice given to you by your health care provider. Make sure you discuss any questions you have with your health care provider. Document Revised: 11/06/2017 Document Reviewed: 04/09/2017 Elsevier Patient Education  2020 [employer].

## 2023-07-21 NOTE — Progress Notes (Signed)
LOW-RISK PREGNANCY VISIT Patient name: Bailey Palmer MRN [id number]  Date of birth: [birth year] Chief Complaint:   Routine Prenatal Visit (Ultrasound today)  History of Present Illness:   Bailey Palmer is a 27 y.o. G98P2002 female at [redacted]w[redacted]d with an Estimated Date of Delivery: 08/09/23 being seen today for ongoing management of a low-risk pregnancy.   Today she reports  brb after sex the other day, none since . Contractions: Irregular. Vag. Bleeding: Other (spotting after sex 8/11, none since).  Movement: Present. denies leaking of fluid.     05/18/2023    9:06 AM 02/03/2023    2:26 PM 04/17/2021    8:48 AM 02/16/2020   11:26 AM 10/11/2019    1:43 PM  Depression screen PHQ 2/9  Decreased Interest 0 0 0 0 0  Down, Depressed, Hopeless 0 0 0 0 0  PHQ - 2 Score 0 0 0 0 0  Altered sleeping 1 1 1     Tired, decreased energy  0 0    Change in appetite 0 0 0    Feeling bad or failure about yourself  0 0 0    Trouble concentrating 0 0 0    Moving slowly or fidgety/restless 0 0 0    Suicidal thoughts 0 0 0    PHQ-9 Score 1 1 1     Difficult doing work/chores Not difficult at all            02/03/2023    2:26 PM 04/17/2021    8:48 AM  GAD 7 : Generalized Anxiety Score  Nervous, Anxious, on Edge 0 0  Control/stop worrying 0 0  Worry too much - different things 0 0  Trouble relaxing 0 0  Restless 0 0  Easily annoyed or irritable 0 0  Afraid - awful might happen 0 0  Total GAD 7 Score 0 0      Review of Systems:   Pertinent items are noted in HPI Denies abnormal vaginal discharge w/ itching/odor/irritation, headaches, visual changes, shortness of breath, chest pain, abdominal pain, severe nausea/vomiting, or problems with urination or bowel movements unless otherwise stated above. Pertinent History Reviewed:  Reviewed past medical,surgical, social, obstetrical and family history.  Reviewed problem list, medications and allergies. Physical Assessment:   Vitals:   07/21/23 1047   BP: 119/79  Pulse: 93  Weight: 199 lb (90.3 kg)  Body mass index is 35.25 kg/m.        Physical Examination:   General appearance: Well appearing, and in no distress  Mental status: Alert, oriented to person, place, and time  Skin: Warm & dry  Cardiovascular: Normal heart rate noted  Respiratory: Normal respiratory effort, no distress  Abdomen: Soft, gravid, nontender  Pelvic: Cervical exam deferred         Extremities: Edema: None  Fetal Status:     Movement: Present  [place] 37+2 wks,cephalic,right lateral placenta gr 3,FHR 132 bpm,AFI 16 cm,EFW 3326 g 72%   Chaperone: N/A   No results found for this or any previous visit (from the past 24 hour(s)).  Assessment & Plan:  1) Low-risk pregnancy G3P2002 at [redacted]w[redacted]d with an Estimated Date of Delivery: 08/09/23   2) H/O LGA, EFW 72% today/3326g   Meds: No orders of the defined types were placed in this encounter.  Labs/procedures today: U/S  Plan:  Continue routine obstetrical care  Next visit: prefers in person    Reviewed: Term labor symptoms and general obstetric precautions including but not limited to  vaginal bleeding, contractions, leaking of fluid and fetal movement were reviewed in detail with the patient.  All questions were answered. Does have home bp cuff. Office bp cuff given: not applicable. Check bp weekly, let us know if consistently >140 and/or >90.  Follow-up: No follow-ups on file.  Future Appointments  Date Time Provider Department Center  07/27/2023  4:10 PM Cheral Marker, CNM [facility]  08/03/2023  4:10 PM Cheral Marker, CNM [facility]  08/11/2023  9:50 AM Cheral Marker, CNM [facility]  08/11/2023  9:50 AM [facility] NURSE [facility]    No orders of the defined types were placed in this encounter.  Cheral Marker CNM, [facility] 07/21/2023 10:53 AM

## 2023-07-27 ENCOUNTER — Ambulatory Visit (INDEPENDENT_AMBULATORY_CARE_PROVIDER_SITE_OTHER): Payer: Medicaid Other | Admitting: Women's Health

## 2023-07-27 ENCOUNTER — Encounter: Payer: Self-pay | Admitting: Women's Health

## 2023-07-27 VITALS — BP 128/77 | HR 97 | Wt 200.4 lb

## 2023-07-27 DIAGNOSIS — Z3483 Encounter for supervision of other normal pregnancy, third trimester: Secondary | ICD-10-CM

## 2023-07-27 NOTE — Progress Notes (Signed)
LOW-RISK PREGNANCY VISIT Patient name: Bailey Palmer MRN [id number]  Date of birth: 14-Jan-1996 Chief Complaint:   Routine Prenatal Visit  History of Present Illness:   Bailey Palmer is a 27 y.o. G52P2002 female at [redacted]w[redacted]d with an Estimated Date of Delivery: 08/09/23 being seen today for ongoing management of a low-risk pregnancy.   Today she reports occasional contractions. Contractions: Not present. Vag. Bleeding: None.  Movement: Present. denies leaking of fluid.     05/18/2023    9:06 AM 02/03/2023    2:26 PM 04/17/2021    8:48 AM 02/16/2020   11:26 AM 10/11/2019    1:43 PM  Depression screen PHQ 2/9  Decreased Interest 0 0 0 0 0  Down, Depressed, Hopeless 0 0 0 0 0  PHQ - 2 Score 0 0 0 0 0  Altered sleeping 1 1 1     Tired, decreased energy  0 0    Change in appetite 0 0 0    Feeling bad or failure about yourself  0 0 0    Trouble concentrating 0 0 0    Moving slowly or fidgety/restless 0 0 0    Suicidal thoughts 0 0 0    PHQ-9 Score 1 1 1     Difficult doing work/chores Not difficult at all            02/03/2023    2:26 PM 04/17/2021    8:48 AM  GAD 7 : Generalized Anxiety Score  Nervous, Anxious, on Edge 0 0  Control/stop worrying 0 0  Worry too much - different things 0 0  Trouble relaxing 0 0  Restless 0 0  Easily annoyed or irritable 0 0  Afraid - awful might happen 0 0  Total GAD 7 Score 0 0      Review of Systems:   Pertinent items are noted in HPI Denies abnormal vaginal discharge w/ itching/odor/irritation, headaches, visual changes, shortness of breath, chest pain, abdominal pain, severe nausea/vomiting, or problems with urination or bowel movements unless otherwise stated above. Pertinent History Reviewed:  Reviewed past medical,surgical, social, obstetrical and family history.  Reviewed problem list, medications and allergies. Physical Assessment:   Vitals:   07/27/23 1614  BP: 128/77  Pulse: 97  Weight: 200 lb 6.4 oz (90.9 kg)  Body mass index  is 35.5 kg/m.        Physical Examination:   General appearance: Well appearing, and in no distress  Mental status: Alert, oriented to person, place, and time  Skin: Warm & dry  Cardiovascular: Normal heart rate noted  Respiratory: Normal respiratory effort, no distress  Abdomen: Soft, gravid, nontender  Pelvic: Cervical exam performed  Dilation: 1.5 Effacement (%): Thick Station: Ballotable  Extremities: Edema: Trace  Fetal Status: Fetal Heart Rate (bpm): 143 Fundal Height: 37 cm Movement: Present Presentation: Vertex  Chaperone: N/A   No results found for this or any previous visit (from the past 24 hour(s)).  Assessment & Plan:  1) Low-risk pregnancy G3P2002 at [redacted]w[redacted]d with an Estimated Date of Delivery: 08/09/23    Meds: No orders of the defined types were placed in this encounter.  Labs/procedures today: SVE  Plan:  Continue routine obstetrical care  Next visit: prefers in person    Reviewed: Term labor symptoms and general obstetric precautions including but not limited to vaginal bleeding, contractions, leaking of fluid and fetal movement were reviewed in detail with the patient.  All questions were answered. Does have home bp cuff. Office bp  cuff given: not applicable. Check bp weekly, let us know if consistently >140 and/or >90.  Follow-up: Return for As scheduled.  Future Appointments  Date Time Provider Department Center  08/03/2023  4:10 PM Cheral Marker, [place] [facility]  08/11/2023  9:50 AM Cheral Marker, CNM [facility]  08/11/2023  9:50 AM [facility] NURSE [facility]    No orders of the defined types were placed in this encounter.  Cheral Marker CNM, [facility] 07/27/2023 4:50 PM

## 2023-07-27 NOTE — Patient Instructions (Signed)
 Bailey Palmer, thank you for choosing our office today! We appreciate the opportunity to meet your healthcare needs. You may receive a short survey by mail, e-mail, or through [employer]. If you are happy with your care we would appreciate if you could take just a few minutes to complete the survey questions. We read all of your comments and take your feedback very seriously. Thank you again for choosing our office.  Center for [employer] Team at [facility]  [facility] at [facility] ([address] [place], [place] [address]) Entrance C, located off of [address] Free 24/7 valet parking   CLASSES: Go to [email or web address] to register for classes (childbirth, breastfeeding, waterbirth, infant CPR, daddy bootcamp, etc.)  Call the office [phone]) or go to [facility] if: You begin to have strong, frequent contractions Your water breaks.  Sometimes it is a big gush of fluid, sometimes it is just a trickle that keeps getting your panties wet or running down your legs You have vaginal bleeding.  It is normal to have a small amount of spotting if your cervix was checked.  You don't feel your baby moving like normal.  If you don't, get you something to eat and drink and lay down and focus on feeling your baby move.   If your baby is still not moving like normal, you should call the office or go to [facility].  Call the office [phone]) or go to [facility] hospital for these signs of pre-eclampsia: Severe headache that does not go away with Tylenol Visual changes- seeing spots, double, blurred vision Pain under your right breast or upper abdomen that does not go away with Tums or heartburn medicine Nausea and/or vomiting Severe swelling in your hands, feet, and face   [facility] Pediatricians/Family Doctors [facility]): [address] Colette Ribas, [phone]           [facility]: [address], [phone]                 [facility]): [address], [phone] (call to ask if accepting patients) [facility] Department: [address], [place], [phone]    [facility] Pediatricians/Family Doctors [facility]): [address], [phone] [facility]: [address] [place], [phone] [facility]: [address], [phone]  [facility] Doctors  [facility]): [phone] [facility]: [address], [phone]   [facility] Doctors [facility]: [address], [phone]  [facility] Doctors  [facility]: [phone], [phone]  Home Blood Pressure Monitoring for Patients   Your provider has recommended that you check your blood pressure (BP) at least once a week at home. If you do not have a blood pressure cuff at home, one will be provided for you. Contact your provider if you have not received your monitor within 1 week.   Helpful Tips for Accurate Home Blood Pressure Checks  Don't smoke, exercise, or drink caffeine 30 minutes before checking your BP Use the restroom before checking your BP (a full bladder can raise your pressure) Relax in a comfortable upright chair Feet on the ground Left arm resting comfortably on a flat surface at the level of your heart Legs uncrossed Back supported Sit quietly and don't talk Place the cuff on your bare arm Adjust snuggly, so that only two fingertips  can fit between your skin and the top of the cuff Check 2 readings separated by at least one minute Keep a log of your BP readings For a visual, please reference this diagram: http://ccnc.care/bpdiagram  Provider Name: [facility]     Phone: [phone]  Zone 1: ALL CLEAR  Continue to monitor your symptoms:  BP reading is less than 140 (top number) or less than 90 (bottom number)  No right  upper stomach pain No headaches or seeing spots No feeling nauseated or throwing up No swelling in face and hands  Zone 2: CAUTION Call your doctor's office for any of the following:  BP reading is greater than 140 (top number) or greater than 90 (bottom number)  Stomach pain under your ribs in the middle or right side Headaches or seeing spots Feeling nauseated or throwing up Swelling in face and hands  Zone 3: EMERGENCY  Seek immediate medical care if you have any of the following:  BP reading is greater than160 (top number) or greater than 110 (bottom number) Severe headaches not improving with Tylenol Serious difficulty catching your breath Any worsening symptoms from Zone 2   Braxton Hicks Contractions Contractions of the uterus can occur throughout pregnancy, but they are not always a sign that you are in labor. You may have practice contractions called Braxton Hicks contractions. These false labor contractions are sometimes confused with true labor. What are [name] contractions? Braxton Hicks contractions are tightening movements that occur in the muscles of the uterus before labor. Unlike true labor contractions, these contractions do not result in opening (dilation) and thinning of the cervix. Toward the end of pregnancy (32-34 weeks), Braxton Hicks contractions can happen more often and may become stronger. These contractions are sometimes difficult to tell apart from true labor because they can be very uncomfortable. You should not feel embarrassed if you go to the hospital with false labor. Sometimes, the only way to tell if you are in true labor is for your health care provider to look for changes in the cervix. The health care provider will do a physical exam and may monitor your contractions. If you are not in true labor, the exam should show that your cervix is not dilating and your water has not broken. If there are no other health problems associated with your  pregnancy, it is completely safe for you to be sent home with false labor. You may continue to have Braxton Hicks contractions until you go into true labor. How to tell the difference between true labor and false labor True labor Contractions last 30-70 seconds. Contractions become very regular. Discomfort is usually felt in the top of the uterus, and it spreads to the lower abdomen and low back. Contractions do not go away with walking. Contractions usually become more intense and increase in frequency. The cervix dilates and gets thinner. False labor Contractions are usually shorter and not as strong as true labor contractions. Contractions are usually irregular. Contractions are often felt in the front of the lower abdomen and in the groin. Contractions may go away when you walk around or change positions while lying down. Contractions get weaker and are shorter-lasting as time goes on. The cervix usually does not dilate or become thin. Follow these instructions at home:  Take over-the-counter and prescription medicines only as told by your health care provider. Keep up with your usual exercises and follow other instructions from your health care provider. Eat and drink lightly if you think  you are going into labor. If Braxton Hicks contractions are making you uncomfortable: Change your position from lying down or resting to walking, or change from walking to resting. Sit and rest in a tub of warm water. Drink enough fluid to keep your urine pale yellow. Dehydration may cause these contractions. Do slow and deep breathing several times an hour. Keep all follow-up prenatal visits as told by your health care provider. This is important. Contact a health care provider if: You have a fever. You have continuous pain in your abdomen. Get help right away if: Your contractions become stronger, more regular, and closer together. You have fluid leaking or gushing from your vagina. You pass  blood-tinged mucus (bloody show). You have bleeding from your vagina. You have low back pain that you never had before. You feel your baby's head pushing down and causing pelvic pressure. Your baby is not moving inside you as much as it used to. Summary Contractions that occur before labor are called Braxton Hicks contractions, false labor, or practice contractions. Braxton Hicks contractions are usually shorter, weaker, farther apart, and less regular than true labor contractions. True labor contractions usually become progressively stronger and regular, and they become more frequent. Manage discomfort from [facility] contractions by changing position, resting in a warm bath, drinking plenty of water, or practicing deep breathing. This information is not intended to replace advice given to you by your health care provider. Make sure you discuss any questions you have with your health care provider. Document Revised: 11/06/2017 Document Reviewed: 04/09/2017 Elsevier Patient Education  2020 [employer].

## 2023-07-29 ENCOUNTER — Encounter (HOSPITAL_COMMUNITY): Payer: Self-pay | Admitting: Obstetrics and Gynecology

## 2023-07-29 ENCOUNTER — Inpatient Hospital Stay (HOSPITAL_COMMUNITY)
Admission: AD | Admit: 2023-07-29 | Discharge: 2023-07-29 | Disposition: A | Discharge status: Home / Self Care | Payer: Medicaid Other | Attending: Obstetrics and Gynecology | Admitting: Obstetrics and Gynecology

## 2023-07-29 DIAGNOSIS — O471 False labor at or after 37 completed weeks of gestation: Secondary | ICD-10-CM | POA: Diagnosis not present

## 2023-07-29 DIAGNOSIS — Z3A38 38 weeks gestation of pregnancy: Secondary | ICD-10-CM

## 2023-07-29 DIAGNOSIS — O479 False labor, unspecified: Secondary | ICD-10-CM | POA: Diagnosis not present

## 2023-07-29 NOTE — MAU Provider Note (Signed)
None     Bailey Palmer is a 27 y.o. [phone] pregnant/non-pregnant female at [redacted]w[redacted]d who presents to MAU today with complaint of CTX after intercourse about 45mins-1hour prior.  Patient states about after intercourse started having contractions 5-25mins.  She states once she got into the car they "fizzled out".   Denies VB, LOF.  Report +FM.    Receives care at [facility]. Prenatal records reviewed.  Pertinent items noted in HPI and remainder of comprehensive ROS otherwise negative.   O BP 116/85   Pulse 86   Temp 98.1 F (36.7 C) (Oral)   Resp 16   Ht 5\' 3"  (1.6 m)   Wt 90.8 kg   LMP 11/02/2022   SpO2 100%   BMI 35.46 kg/m   NST: 150 bpm baseline, moderate variability, + accels, 3 variable decels (2 preceded by accel, 1 minor)   otherwise reassuring strip, rare ctx    MDM: moderate MAU Course:  Patient presented as labor r/o, appears to be in false labor as rare contractions and no cervical change per nursing report.  3 variable decels noticed on multi hour NST either minor or preceded by accel. Discussed with MAU Attending Dr. Crissie Reese and overall reassuring and believed to be in false labor.    A&P:  Discharge from MAU in stable condition with strict / usual precautions Follow up at [facility] as scheduled for ongoing prenatal care  Allergies as of 07/29/2023   No Known Allergies      Medication List     TAKE these medications    Blood Pressure Monitor Misc For regular home bp monitoring during pregnancy   CitraNatal Medley 27-1-200 MG Caps Take 1 capsule by mouth at bedtime.   ferrous sulfate 325 (65 FE) MG tablet Take 1 tablet (325 mg total) by mouth every other day.        Bailey Dibble, MD 07/29/2023 11:14 AM

## 2023-07-29 NOTE — MAU Note (Signed)
.  Bailey Palmer is a 27 y.o. at [redacted]w[redacted]d here in MAU reporting: she had intercourse around 0200 this morning and started having ctx about 30 minutes after. She reports the ctx were every 5-10 minutes, but when she got in the car they stopped. She reports some pink discharge mixed with mucous, but denies watery leaking of fluid. Reports +FM. LMP: N/A Onset of complaint: Today Pain score: 7/10 Vitals:   07/29/23 0827 07/29/23 0835  BP: 112/68 116/85  Pulse: 87 86  Resp: 16   Temp: 98.1 F (36.7 C)   SpO2: 99% 100%     FHT: Bypass triage/patient taking directly to room Lab orders placed from triage:  MAU labor

## 2023-07-30 ENCOUNTER — Other Ambulatory Visit: Payer: Self-pay

## 2023-07-30 ENCOUNTER — Inpatient Hospital Stay (HOSPITAL_COMMUNITY)
Admission: AD | Admit: 2023-07-30 | Discharge: 2023-08-02 | DRG: 807 | Disposition: A | Discharge status: Home / Self Care | Payer: Medicaid Other | Attending: Obstetrics & Gynecology | Admitting: Obstetrics & Gynecology

## 2023-07-30 ENCOUNTER — Telehealth: Payer: Self-pay | Admitting: *Deleted

## 2023-07-30 ENCOUNTER — Encounter (HOSPITAL_COMMUNITY): Payer: Self-pay | Admitting: Obstetrics & Gynecology

## 2023-07-30 DIAGNOSIS — Z833 Family history of diabetes mellitus: Secondary | ICD-10-CM

## 2023-07-30 DIAGNOSIS — O4292 Full-term premature rupture of membranes, unspecified as to length of time between rupture and onset of labor: Principal | ICD-10-CM | POA: Diagnosis present

## 2023-07-30 DIAGNOSIS — Z8249 Family history of ischemic heart disease and other diseases of the circulatory system: Secondary | ICD-10-CM | POA: Diagnosis not present

## 2023-07-30 DIAGNOSIS — Z3A38 38 weeks gestation of pregnancy: Secondary | ICD-10-CM | POA: Diagnosis not present

## 2023-07-30 DIAGNOSIS — O99214 Obesity complicating childbirth: Secondary | ICD-10-CM | POA: Diagnosis present

## 2023-07-30 DIAGNOSIS — O26893 Other specified pregnancy related conditions, third trimester: Secondary | ICD-10-CM | POA: Diagnosis not present

## 2023-07-30 DIAGNOSIS — O4202 Full-term premature rupture of membranes, onset of labor within 24 hours of rupture: Secondary | ICD-10-CM | POA: Diagnosis not present

## 2023-07-30 LAB — TYPE AND SCREEN
ABO/RH(D): O POS
Antibody Screen: NEGATIVE

## 2023-07-30 LAB — CBC
HCT: 26.2 % — ABNORMAL LOW (ref 36.0–46.0)
Hemoglobin: 7.8 g/dL — ABNORMAL LOW (ref 12.0–15.0)
MCH: 22.2 pg — ABNORMAL LOW (ref 26.0–34.0)
MCHC: 29.8 g/dL — ABNORMAL LOW (ref 30.0–36.0)
MCV: 74.6 fL — ABNORMAL LOW (ref 80.0–100.0)
Platelets: 252 10*3/uL (ref 150–400)
RBC: 3.51 MIL/uL — ABNORMAL LOW (ref 3.87–5.11)
RDW: 15.9 % — ABNORMAL HIGH (ref 11.5–15.5)
WBC: 7.6 10*3/uL (ref 4.0–10.5)
nRBC: 0 % (ref 0.0–0.2)

## 2023-07-30 LAB — POCT FERN TEST: POCT Fern Test: POSITIVE

## 2023-07-30 MED ORDER — LACTATED RINGERS IV SOLN: INTRAVENOUS | Status: DC

## 2023-07-30 MED ORDER — LIDOCAINE HCL (PF) 1 % IJ SOLN: 30.0000 mL | INTRAMUSCULAR | Status: DC | PRN

## 2023-07-30 MED ORDER — LACTATED RINGERS IV SOLN: 500.0000 mL | INTRAVENOUS | Status: DC | PRN

## 2023-07-30 MED ORDER — TERBUTALINE SULFATE 1 MG/ML IJ SOLN: 0.2500 mg | Freq: Once | INTRAMUSCULAR | Status: DC | PRN

## 2023-07-30 MED ORDER — OXYTOCIN-SODIUM CHLORIDE 30-0.9 UT/500ML-% IV SOLN: 2.5000 [IU]/h | INTRAVENOUS | Status: DC

## 2023-07-30 MED ORDER — OXYTOCIN BOLUS FROM INFUSION
333.0000 mL | Freq: Once | INTRAVENOUS | Status: AC
  Administered 2023-07-31: 333 mL via INTRAVENOUS

## 2023-07-30 MED ORDER — OXYTOCIN-SODIUM CHLORIDE 30-0.9 UT/500ML-% IV SOLN
1.0000 m[IU]/min | INTRAVENOUS | Status: DC
  Administered 2023-07-30: 2 m[IU]/min via INTRAVENOUS
  Filled 2023-07-30: qty 500

## 2023-07-30 NOTE — Progress Notes (Signed)
LABOR NOTE Bailey Palmer is a 27 y.o. G3P2002 at [redacted]w[redacted]d admitted for SROM  Subjective: no complaints and not feeling contractions  Objective: BP 112/72   Pulse 93   Temp 98 F (36.7 C) (Oral)   Resp 17   Ht 5\' 3"  (1.6 m)   LMP 11/02/2022   SpO2 100%   BMI 35.46 kg/m  No intake/output data recorded.  FHR baseline 135 bpm, Variability: moderate, Accelerations:present, Decelerations:  Absent Toco: irregular   SVE:   Dilation: 3 Effacement (%): 50 Station: -1 Exam by:: Auri CNM    Labs: Lab Results  Component Value Date   WBC 7.6 07/30/2023   HGB 7.8 (L) 07/30/2023   HCT 26.2 (L) 07/30/2023   MCV 74.6 (L) 07/30/2023   PLT 252 07/30/2023    Assessment / Plan: Augmentation of labor, Bailey Palmer arrived with SROM at 1630 with irregular contractions. Not feeling the contractions. No cervical progression in 7 hours. Will augment with pitocin 2x2.   Labor: early Fetal Wellbeing:  Category I I/D:  GBS neg Anticipated MOD: NSVB  Cleda Mccreedy SNM 07/30/2023, 11:29 PM

## 2023-07-30 NOTE — MAU Note (Signed)
.  Bailey Palmer is a 27 y.o. at [redacted]w[redacted]d here in MAU reporting: water broke around 4:30 pm. Clear fluid. No ctx. Reports fetal movement has been a little less than usual all day but still feels regular movements  Onset of complaint: 1630 Pain score: 0 Vitals:   07/30/23 1831  BP: 118/77  Pulse: 98  Resp: 18  Temp: 98.2 F (36.8 C)     FHT:150  Lab orders placed from triage:  labor eval

## 2023-07-30 NOTE — H&P (Signed)
Bailey Palmer is a 27 y.o. female G3P2002 with IUP at [redacted]w[redacted]d by LMP/8 weeks [place] presenting for ROM at 1630.  Not contracting much. Seen yesterday for labor check, was 3cms.     Prenatal History/Complications: PNC at [facility]  Pregnancy complications:  - Past Medical History: Past Medical History:  Diagnosis Date   Anemia    IDA   Chronic abdominal pain    Cyst near tailbone    DUB (dysfunctional uterine bleeding)    Gonorrhea 07/04/2022   07/04/22 to get rocephin today in office, POC________   Heart murmur    Trichimoniasis 08/08/2020   Treated with flagyl for BV on 8/31 which will cover trich too.     Past Surgical History: Past Surgical History:  Procedure Laterality Date   APPENDECTOMY     CHOLECYSTECTOMY     MASS EXCISION N/A 04/24/2017   Procedure: EXCISION PILONDIAL CYST;  Surgeon: Franky Macho, MD;  Location: [facility];  Service: General;  Laterality: N/A;    Obstetrical History: OB History     Gravida  3   Para  2   Term  2   Preterm      AB      Living  2      SAB      IAB      Ectopic      Multiple  0   Live Births  2            Social History: Social History   Socioeconomic History   Marital status: Single    Spouse name: Not on file   Number of children: 2   Years of education: Not on file   Highest education level: Not on file  Occupational History   Not on file  Tobacco Use   Smoking status: Never   Smokeless tobacco: Never  Vaping Use   Vaping status: Never Used  Substance and Sexual Activity   Alcohol use: No    Alcohol/week: 0.0 standard drinks of alcohol   Drug use: No   Sexual activity: Yes    Birth control/protection: None  Other Topics Concern   Not on file  Social History Narrative   Not on file   Social Determinants of Health   Financial Resource Strain: Low Risk  (02/03/2023)   Overall Financial Resource Strain (CARDIA)    Difficulty of Paying Living Expenses: Not hard at all  Food Insecurity: No  Food Insecurity (02/03/2023)   Hunger Vital Sign    Worried About Running Out of Food in the Last Year: Never true    Ran Out of Food in the Last Year: Never true  Transportation Needs: No Transportation Needs (02/03/2023)   PRAPARE - Administrator, Civil Service (Medical): No    Lack of Transportation (Non-Medical): No  Physical Activity: Insufficiently Active (02/03/2023)   Exercise Vital Sign    Days of Exercise per Week: 2 days    Minutes of Exercise per Session: 10 min  Stress: No Stress Concern Present (02/03/2023)   [employer] of Occupational Health - Occupational Stress Questionnaire    Feeling of Stress : Only a little  Social Connections: Moderately Isolated (02/03/2023)   Social Connection and Isolation Panel [NHANES]    Frequency of Communication with Friends and Family: More than three times a week    Frequency of Social Gatherings with Friends and Family: Three times a week    Attends Religious Services: 1 to 4 times per  year    Active Member of Clubs or Organizations: No    Attends Banker Meetings: Never    Marital Status: Never married    Family History: Family History  Problem Relation Age of Onset   Asthma Mother    Miscarriages / [place] Mother    Varicose Veins Mother    Alpha-1 antitrypsin deficiency Mother        carrier   ADD / ADHD Brother    Diabetes Maternal Grandmother    Hypertension Maternal Grandmother    Heart disease Maternal Grandmother    Asthma Maternal Aunt    Alpha-1 antitrypsin deficiency Maternal Aunt    Depression Maternal Aunt     Allergies: No Known Allergies  Medications Prior to Admission  Medication Sig Dispense Refill Last Dose   ferrous sulfate 325 (65 FE) MG tablet Take 1 tablet (325 mg total) by mouth every other day. (Patient not taking: Reported on 06/01/2023) 45 tablet 2    Blood Pressure Monitor MISC For regular home bp monitoring during pregnancy 1 each 0    Prenat-FeCb-FeFum-FA-DHA  w/o A (CITRANATAL MEDLEY) 27-1-200 MG CAPS Take 1 capsule by mouth at bedtime. 30 capsule 12     Review of Systems   Constitutional: Negative for fever and chills Eyes: Negative for visual disturbances Respiratory: Negative for shortness of breath, dyspnea Cardiovascular: Negative for chest pain or palpitations  Gastrointestinal: Negative for vomiting, diarrhea and constipation.  POSITIVE for abdominal pain (contractions) Genitourinary: Negative for dysuria and urgency Musculoskeletal: Negative for back pain, joint pain, myalgias  Neurological: Negative for dizziness and headaches  Blood pressure 118/77, pulse 98, temperature 98.2 F (36.8 C), resp. rate 18, last menstrual period 11/02/2022. General appearance: alert, cooperative, and no distress Lungs: normal respiratory effort Heart: regular rate and rhythm Abdomen: soft, non-tender; bowel sounds normal Extremities: Homans sign is negative, no sign of DVT DTR's 2+ Presentation: cephalic Fetal monitoring  Baseline: 140 bpm, Variability: Good {> 6 bpm), Accelerations: Reactive, and Decelerations: Absent Uterine activity  None     Prenatal labs: ABO, Rh: O/Positive/-- (02/27 1507) Antibody: Negative (06/10 0824) Rubella: 1.19 (02/27 1507) RPR: Non Reactive (06/10 0824)  HBsAg: Negative (02/27 1507)  HIV: Non Reactive (06/10 0824)  GBS: Negative/-- (08/06 1100)   NURSING  PROVIDER  [facility] Dating by LMP c/w U/S at 8 wks  [facility] Model Traditional Anatomy U/S Normal female  Initiated care at  [facility]                Language  English              LAB RESULTS   Support Person Saquan Hooper [employer] NIPS:  LR female           AFP:                NT/IT (FT only) neg    Carrier Screen Horizon: declined  Rhogam  O/Positive/-- (02/27 1507) A1C/GTT Early: 5.2          Third trimester: normal  Flu Vaccine N/a    TDaP Vaccine 05/18/23  Blood Type O/Positive/-- (02/27 1507)  Covid Vaccine  Antibody Negative (02/27  1507)    Rubella 1.19 (02/27 1507)  Feeding Plan Bottle, maybe breast RPR Non Reactive (02/27 1507)  Contraception undecided HBsAg Negative (02/27 1507)  Circumcision N/a HIV Non Reactive (02/27 1507)  Pediatrician  [place] HCVAb Non Reactive (02/27 1507)  Prenatal Classes discussed      Pap Diagnosis  Date Value Ref Range Status  04/17/2021   Final   - Negative for intraepithelial lesion or malignancy (NILM)    BTLConsent  GC/CT Initial:  -/-           36wks:  -/-  VBAC  Consent  GBS  neg For PCN allergy, check sensitivities        DME Rx [x]  BP cuff [ ]  Weight Scale Waterbirth  [ ]  Class [ ]  Consent [ ]  CNM visit  PHQ9 & GAD7 [  ] new OB [  ] 28 weeks  [  ] 36 weeks Induction  [ ]  Orders Entered [ ] Foley Y/N     Prenatal Transfer Tool  Maternal Diabetes: No Genetic Screening: Normal Maternal Ultrasounds/Referrals: Declined Fetal Ultrasounds or other Referrals:  None Maternal Substance Abuse:  No Significant Maternal Medications:  None Significant Maternal Lab Results: Group B Strep negative  Results for orders placed or performed during the hospital encounter of 07/30/23 (from the past 24 hour(s))  POCT fern test   Collection Time: 07/30/23  6:56 PM  Result Value Ref Range   POCT Fern Test Positive = ruptured amniotic membanes     Assessment: Bailey Palmer is a 27 y.o. [id number] with an IUP at [redacted]w[redacted]d presenting for PROM  Plan: #Labor: expectant vs active management of PROM;  wants to start pitocin when her partner gets to hospital #Pain:  Per request #FWB Cat 1 #ID: GBS: neg   Jacklyn Shell 07/30/2023, 7:15 PM

## 2023-07-30 NOTE — Telephone Encounter (Signed)
Pt called with concerns that she was ruptured. She reports that she had 3 separate instances of her pants being soaked. She states that when she coughs more comes out. Advised patient go to MAU for evaluation as this office would soon be closing.

## 2023-07-30 NOTE — MAU Note (Signed)

## 2023-07-31 ENCOUNTER — Encounter (HOSPITAL_COMMUNITY): Payer: Self-pay | Admitting: Obstetrics & Gynecology

## 2023-07-31 ENCOUNTER — Inpatient Hospital Stay (HOSPITAL_COMMUNITY): Payer: Medicaid Other | Admitting: Anesthesiology

## 2023-07-31 DIAGNOSIS — Z3A38 38 weeks gestation of pregnancy: Secondary | ICD-10-CM | POA: Diagnosis not present

## 2023-07-31 DIAGNOSIS — O4202 Full-term premature rupture of membranes, onset of labor within 24 hours of rupture: Secondary | ICD-10-CM | POA: Diagnosis not present

## 2023-07-31 LAB — RPR: RPR Ser Ql: NONREACTIVE

## 2023-07-31 MED ORDER — FENTANYL-BUPIVACAINE-NACL 0.5-0.125-0.9 MG/250ML-% EP SOLN
EPIDURAL | Status: DC | PRN
  Administered 2023-07-31: 12 mL/h via EPIDURAL

## 2023-07-31 MED ORDER — FLEET ENEMA RE ENEM: 1.0000 | ENEMA | Freq: Every day | RECTAL | Status: DC | PRN

## 2023-07-31 MED ORDER — WITCH HAZEL-GLYCERIN EX PADS: 1.0000 | MEDICATED_PAD | CUTANEOUS | Status: DC | PRN

## 2023-07-31 MED ORDER — SODIUM CHLORIDE 0.9 % IV SOLN: INTRAVENOUS | Status: DC | PRN

## 2023-07-31 MED ORDER — METHYLERGONOVINE MALEATE 0.2 MG PO TABS: 0.2000 mg | ORAL_TABLET | ORAL | Status: DC | PRN

## 2023-07-31 MED ORDER — MEASLES, MUMPS & RUBELLA VAC IJ SOLR: 0.5000 mL | Freq: Once | INTRAMUSCULAR | Status: DC

## 2023-07-31 MED ORDER — LIDOCAINE HCL (PF) 1 % IJ SOLN
INTRAMUSCULAR | Status: DC | PRN
  Administered 2023-07-31: 2 mL via EPIDURAL
  Administered 2023-07-31: 10 mL via EPIDURAL

## 2023-07-31 MED ORDER — EPHEDRINE 5 MG/ML INJ: 10.0000 mg | INTRAVENOUS | Status: DC | PRN

## 2023-07-31 MED ORDER — SENNOSIDES-DOCUSATE SODIUM 8.6-50 MG PO TABS
2.0000 | ORAL_TABLET | ORAL | Status: DC
  Administered 2023-07-31 – 2023-08-02 (×3): 2 via ORAL
  Filled 2023-07-31 (×3): qty 2

## 2023-07-31 MED ORDER — MEDROXYPROGESTERONE ACETATE 150 MG/ML IM SUSP: 150.0000 mg | INTRAMUSCULAR | Status: DC | PRN

## 2023-07-31 MED ORDER — TETANUS-DIPHTH-ACELL PERTUSSIS 5-2.5-18.5 LF-MCG/0.5 IM SUSY: 0.5000 mL | PREFILLED_SYRINGE | Freq: Once | INTRAMUSCULAR | Status: DC

## 2023-07-31 MED ORDER — BISACODYL 10 MG RE SUPP: 10.0000 mg | Freq: Every day | RECTAL | Status: DC | PRN

## 2023-07-31 MED ORDER — DIPHENHYDRAMINE HCL 25 MG PO CAPS: 25.0000 mg | ORAL_CAPSULE | Freq: Four times a day (QID) | ORAL | Status: DC | PRN

## 2023-07-31 MED ORDER — IBUPROFEN 600 MG PO TABS
600.0000 mg | ORAL_TABLET | Freq: Four times a day (QID) | ORAL | Status: DC
  Administered 2023-07-31 – 2023-08-02 (×7): 600 mg via ORAL
  Filled 2023-07-31 (×9): qty 1

## 2023-07-31 MED ORDER — TRANEXAMIC ACID-NACL 1000-0.7 MG/100ML-% IV SOLN: 1000.0000 mg | Freq: Once | INTRAVENOUS | Status: DC

## 2023-07-31 MED ORDER — FERROUS SULFATE 325 (65 FE) MG PO TABS
325.0000 mg | ORAL_TABLET | ORAL | Status: DC
  Administered 2023-07-31 – 2023-08-02 (×2): 325 mg via ORAL
  Filled 2023-07-31 (×2): qty 1

## 2023-07-31 MED ORDER — PHENYLEPHRINE 80 MCG/ML (10ML) SYRINGE FOR IV PUSH (FOR BLOOD PRESSURE SUPPORT)
80.0000 ug | PREFILLED_SYRINGE | INTRAVENOUS | Status: DC | PRN
  Filled 2023-07-31: qty 10

## 2023-07-31 MED ORDER — ONDANSETRON HCL 4 MG PO TABS: 4.0000 mg | ORAL_TABLET | ORAL | Status: DC | PRN

## 2023-07-31 MED ORDER — FENTANYL CITRATE (PF) 100 MCG/2ML IJ SOLN
100.0000 ug | INTRAMUSCULAR | Status: DC | PRN
  Administered 2023-07-31: 100 ug via INTRAVENOUS
  Filled 2023-07-31: qty 2

## 2023-07-31 MED ORDER — LACTATED RINGERS IV SOLN: 500.0000 mL | Freq: Once | INTRAVENOUS | Status: DC

## 2023-07-31 MED ORDER — COCONUT OIL OIL
1.0000 | TOPICAL_OIL | Status: DC | PRN
  Administered 2023-08-01: 1 via TOPICAL

## 2023-07-31 MED ORDER — SIMETHICONE 80 MG PO CHEW: 80.0000 mg | CHEWABLE_TABLET | ORAL | Status: DC | PRN

## 2023-07-31 MED ORDER — MISOPROSTOL 200 MCG PO TABS
ORAL_TABLET | ORAL | Status: AC
  Filled 2023-07-31: qty 4

## 2023-07-31 MED ORDER — TRANEXAMIC ACID-NACL 1000-0.7 MG/100ML-% IV SOLN
1000.0000 mg | Freq: Once | INTRAVENOUS | Status: AC
  Administered 2023-07-31: 1000 mg via INTRAVENOUS
  Filled 2023-07-31: qty 100

## 2023-07-31 MED ORDER — BENZOCAINE-MENTHOL 20-0.5 % EX AERO: 1.0000 | INHALATION_SPRAY | CUTANEOUS | Status: DC | PRN

## 2023-07-31 MED ORDER — DIPHENHYDRAMINE HCL 50 MG/ML IJ SOLN: 12.5000 mg | INTRAMUSCULAR | Status: DC | PRN

## 2023-07-31 MED ORDER — PRENATAL MULTIVITAMIN CH
1.0000 | ORAL_TABLET | Freq: Every day | ORAL | Status: DC
  Administered 2023-08-01 – 2023-08-02 (×2): 1 via ORAL
  Filled 2023-07-31 (×2): qty 1

## 2023-07-31 MED ORDER — MISOPROSTOL 200 MCG PO TABS
800.0000 ug | ORAL_TABLET | Freq: Once | ORAL | Status: AC
  Administered 2023-07-31: 800 ug via RECTAL

## 2023-07-31 MED ORDER — PHENYLEPHRINE 80 MCG/ML (10ML) SYRINGE FOR IV PUSH (FOR BLOOD PRESSURE SUPPORT): 80.0000 ug | PREFILLED_SYRINGE | INTRAVENOUS | Status: DC | PRN

## 2023-07-31 MED ORDER — ACETAMINOPHEN 325 MG PO TABS: 650.0000 mg | ORAL_TABLET | ORAL | Status: DC | PRN

## 2023-07-31 MED ORDER — ONDANSETRON HCL 4 MG/2ML IJ SOLN: 4.0000 mg | INTRAMUSCULAR | Status: DC | PRN

## 2023-07-31 MED ORDER — FENTANYL-BUPIVACAINE-NACL 0.5-0.125-0.9 MG/250ML-% EP SOLN
12.0000 mL/h | EPIDURAL | Status: DC | PRN
  Filled 2023-07-31: qty 250

## 2023-07-31 MED ORDER — METHYLERGONOVINE MALEATE 0.2 MG/ML IJ SOLN: 0.2000 mg | INTRAMUSCULAR | Status: DC | PRN

## 2023-07-31 MED ORDER — DIBUCAINE (PERIANAL) 1 % EX OINT: 1.0000 | TOPICAL_OINTMENT | CUTANEOUS | Status: DC | PRN

## 2023-07-31 MED ORDER — CEFAZOLIN SODIUM-DEXTROSE 1-4 GM/50ML-% IV SOLN
1.0000 g | Freq: Three times a day (TID) | INTRAVENOUS | Status: AC
  Administered 2023-07-31 – 2023-08-01 (×3): 1 g via INTRAVENOUS
  Filled 2023-07-31 (×3): qty 50

## 2023-07-31 NOTE — Lactation Note (Addendum)
This note was copied from a baby's chart. Lactation Consultation Note  Patient Name: Girl Zakaiya Grossman [id number] Date: 07/31/2023 Age:26 hours  Reason for consult: Initial assessment;Early term 37-38.6wks  P3, [redacted]w[redacted]d, early term  Mother states baby latched well after birth and since has been sleepy, gagging and spitty. Mother pushed once with delivery. Mother reports attempting breastfeeding with her first baby but stopped shortly due to concerns of not making enough milk. She did not experience engorgement or leaking after other deliveries. She denies breast changes, fullness or tenderness with this pregnancy. Breast are widely spaced and tubular. Colostrum is present and easily expressed. Mother attempts latch but acknowledges baby needs to process amniotic fluid before baby may be eager to feed.   Mother encouraged to latch baby with feeding cues, place baby skin to skin if not latching, and call for assistance with breastfeeding as needed.     Maternal Data Has patient been taught Hand Expression?: Yes Does the patient have breastfeeding experience prior to this delivery?: Yes How long did the patient breastfeed?: few days  Feeding Mother's Current Feeding Choice: Breast Milk and Formula  LATCH Score  Not observed  Interventions Interventions: Expressed milk;LC Services brochure;Education     Consult Status Consult Status: Follow-up    Omar Person 07/31/2023, 6:21 PM

## 2023-07-31 NOTE — Progress Notes (Signed)
LABOR NOTE Bailey Palmer is a 27 y.o. G3P2002 at [redacted]w[redacted]d admitted for SROM  Subjective: no complaints and comfortable with epidural  Objective: BP 123/67   Pulse 79   Temp 98.2 F (36.8 C) (Oral)   Resp 17   Ht 5\' 3"  (1.6 m)   LMP 11/02/2022   SpO2 98%   BMI 35.46 kg/m  No intake/output data recorded.  FHR baseline 125 bpm, Variability: moderate, Accelerations:present, Decelerations:  Absent Toco: q 2-4 mins   SVE:   Dilation: 3.5 Effacement (%): 60 Station: -2 Exam by:: Amita S RN  Pitocin @ 12 mu/min  Labs: Lab Results  Component Value Date   WBC 7.6 07/30/2023   HGB 7.8 (L) 07/30/2023   HCT 26.2 (L) 07/30/2023   MCV 74.6 (L) 07/30/2023   PLT 252 07/30/2023    Assessment / Plan: Augmentation of labor, progressing normally on pitocin, currently at 11mu/min. Epidural in place patient comfortable. Hgb 7.8 will give TXA at delivery. Will continue with plan of care. NSVB expected.   Labor: early Fetal Wellbeing:  Category I Pain Control:  epidural I/D:  GBS neg Anticipated MOD: NSVB  Cleda Mccreedy SNM 07/31/2023, 4:38 AM

## 2023-07-31 NOTE — Anesthesia Procedure Notes (Signed)
Epidural Patient location during procedure: OB Start time: 07/31/2023 3:55 AM End time: 07/31/2023 4:03 AM  Staffing Anesthesiologist: Lannie Fields, DO Performed: anesthesiologist   Preanesthetic Checklist Completed: patient identified, IV checked, risks and benefits discussed, monitors and equipment checked, pre-op evaluation and timeout performed  Epidural Patient position: sitting Prep: DuraPrep and site prepped and draped Patient monitoring: continuous pulse ox, blood pressure, heart rate and cardiac monitor Approach: midline Location: L3-L4 Injection technique: LOR air  Needle:  Needle type: Tuohy  Needle gauge: 17 G Needle length: 9 cm Needle insertion depth: 6 cm Catheter type: closed end flexible Catheter size: 19 Gauge Catheter at skin depth: 11 cm Test dose: negative  Assessment Sensory level: T8 Events: blood not aspirated, no cerebrospinal fluid, injection not painful, no injection resistance, no paresthesia and negative IV test  Additional Notes Patient identified. Risks/Benefits/Options discussed with patient including but not limited to bleeding, infection, nerve damage, paralysis, failed block, incomplete pain control, headache, blood pressure changes, nausea, vomiting, reactions to medication both or allergic, itching and postpartum back pain. Confirmed with bedside nurse the patient's most recent platelet count. Confirmed with patient that they are not currently taking any anticoagulation, have any bleeding history or any family history of bleeding disorders. Patient expressed understanding and wished to proceed. All questions were answered. Sterile technique was used throughout the entire procedure. Please see nursing notes for vital signs. Test dose was given through epidural catheter and negative prior to continuing to dose epidural or start infusion. Warning signs of high block given to the patient including shortness of breath, tingling/numbness in  hands, complete motor block, or any concerning symptoms with instructions to call for help. Patient was given instructions on fall risk and not to get out of bed. All questions and concerns addressed with instructions to call with any issues or inadequate analgesia.  Reason for block:procedure for pain

## 2023-07-31 NOTE — Anesthesia Preprocedure Evaluation (Signed)
Anesthesia Evaluation  Patient identified by MRN, date of birth, ID band Patient awake    Reviewed: Allergy & Precautions, Patient's Chart, lab work & pertinent test results  Airway Mallampati: II  TM Distance: >3 FB Neck ROM: Full    Dental no notable dental hx.    Pulmonary neg pulmonary ROS   Pulmonary exam normal breath sounds clear to auscultation       Cardiovascular Normal cardiovascular exam+ Valvular Problems/Murmurs (childhood heart murmur, no tx)  Rhythm:Regular Rate:Normal     Neuro/Psych negative neurological ROS  negative psych ROS   GI/Hepatic negative GI ROS, Neg liver ROS,,,  Endo/Other  Obesity BMI 36  Renal/GU negative Renal ROS  negative genitourinary   Musculoskeletal negative musculoskeletal ROS (+)    Abdominal  (+) + obese  Peds negative pediatric ROS (+)  Hematology  (+) Blood dyscrasia, anemia Hb 7.8, plt 252   Anesthesia Other Findings   Reproductive/Obstetrics (+) Pregnancy                             Anesthesia Physical Anesthesia Plan  ASA: 2  Anesthesia Plan: Epidural   Post-op Pain Management:    Induction:   PONV Risk Score and Plan: 2  Airway Management Planned: Natural Airway  Additional Equipment: None  Intra-op Plan:   Post-operative Plan:   Informed Consent: I have reviewed the patients History and Physical, chart, labs and discussed the procedure including the risks, benefits and alternatives for the proposed anesthesia with the patient or authorized representative who has indicated his/her understanding and acceptance.       Plan Discussed with:   Anesthesia Plan Comments:        Anesthesia Quick Evaluation

## 2023-07-31 NOTE — Discharge Summary (Signed)
Postpartum Discharge Summary  Date of Service updated***     Patient Name: Bailey Palmer DOB: Jan 15, 1996 MRN: [id number]  Date of admission: 07/30/2023 Delivery date:07/31/2023 Delivering provider:   Date of discharge: 07/31/2023  Admitting diagnosis: Indication for care in labor or delivery [O75.9] Intrauterine pregnancy: [redacted]w[redacted]d     Secondary diagnosis:  Active Problems:   Vaginal delivery  Additional problems: ***    Discharge diagnosis: Term Pregnancy Delivered                                              Post partum procedures:{Postpartum procedures:23558} Augmentation: Pitocin Complications: manual placental removal  Hospital course: Induction of Labor With Vaginal Delivery   27 y.o. yo G3P2002 at [redacted]w[redacted]d was admitted to the hospital 07/30/2023 for induction of labor.  Indication for induction: PROM.  Patient had an labor course complicated bynothing Membrane Rupture Time/Date: 4:30 PM,07/30/2023  Delivery Method:Vaginal, Spontaneous Operative Delivery:N/A Episiotomy:   Lacerations:    Details of delivery can be found in separate delivery note.  Patient had a postpartum course complicated by***. Patient is discharged home 07/31/23.  Newborn Data: Birth date:07/31/2023 Birth time:6:04 AM Gender:Female Living status:  Apgars: ,  Weight:   Magnesium Sulfate received: No BMZ received: No Rhophylac:N/A MMR:N/A T-DaP:Given prenatally Flu: N/A Transfusion:{Transfusion received:30440034}  Physical exam  Vitals:   07/31/23 0431 07/31/23 0447 07/31/23 0453 07/31/23 0502  BP: 123/67 (!) 103/57 103/61 (!) 102/59  Pulse: 79 84 87 87  Resp:      Temp:    98.6 F (37 C)  TempSrc:    Oral  SpO2: 98%  99% 99%  Height:       General: {Exam; general:21111117} Lochia: {Desc; appropriate/inappropriate:30686::"appropriate"} Uterine Fundus: {Desc; firm/soft:30687} Incision: {Exam; incision:21111123} DVT Evaluation: {Exam; dvt:2111122} Labs: Lab Results  Component  Value Date   WBC 7.6 07/30/2023   HGB 7.8 (L) 07/30/2023   HCT 26.2 (L) 07/30/2023   MCV 74.6 (L) 07/30/2023   PLT 252 07/30/2023      Latest Ref Rng & Units 04/21/2017   11:20 AM  CMP  Glucose 65 - 99 mg/dL 98   BUN 6 - 20 mg/dL 11   Creatinine 5.36 - 1.00 mg/dL 6.44   Sodium 034 - 742 mmol/L 137   Potassium 3.5 - 5.1 mmol/L 3.8   Chloride 101 - 111 mmol/L 106   CO2 22 - 32 mmol/L 26   Calcium 8.9 - 10.3 mg/dL 8.9    Edinburgh Score:    08/10/2018   11:21 AM  Edinburgh Postnatal Depression Scale Screening Tool  I have been able to laugh and see the funny side of things. 0  I have looked forward with enjoyment to things. 0  I have blamed myself unnecessarily when things went wrong. 0  I have been anxious or worried for no good reason. 0  I have felt scared or panicky for no good reason. 0  Things have been getting on top of me. 0  I have been so unhappy that I have had difficulty sleeping. 0  I have felt sad or miserable. 0  I have been so unhappy that I have been crying. 0  The thought of harming myself has occurred to me. 0  Edinburgh Postnatal Depression Scale Total 0     After visit meds:  Allergies as of 07/31/2023   No  Known Allergies   Med Rec must be completed prior to using this [facility]***        Discharge home in stable condition Infant Feeding: {Baby feeding:23562} Infant Disposition:{CHL IP OB HOME WITH YQMVHQ:46962} Discharge instruction: per After Visit Summary and Postpartum booklet. Activity: Advance as tolerated. Pelvic rest for 6 weeks.  Diet: {OB XBMW:41324401} Future Appointments: Future Appointments  Date Time Provider Department Center  08/03/2023  4:10 PM Cheral Marker, [place] [facility]  08/11/2023  9:50 AM Cheral Marker, CNM [facility]  08/11/2023  9:50 AM [facility] NURSE [facility]   Follow up Visit:   Please schedule this patient for a In person postpartum visit in 4 weeks with the following provider: Any  provider. Additional Postpartum F/U:  Low risk pregnancy complicated by:  Delivery mode:  Vaginal, Spontaneous Anticipated Birth Control:   patch   07/31/2023 Jacklyn Shell, CNM

## 2023-08-01 ENCOUNTER — Encounter (HOSPITAL_COMMUNITY): Payer: Self-pay | Admitting: Obstetrics & Gynecology

## 2023-08-01 NOTE — Lactation Note (Signed)
This note was copied from a baby's chart. Lactation Consultation Note  Patient Name: Bailey Palmer [id number] Date: 08/01/2023 Age:26 hours Reason for consult: Follow-up assessment;Breastfeeding assistance;Infant weight loss;Early term 37-38.6wks  Visited P3 lactating parent of an early term infant. Lactating parent reports her nipples are larger and she is working on getting "all of it" in the baby's mouth with latching. LC assessed breast tissue and nipples - lactating parent does have tubular breasts with easily expressed colostrum. Left breast appears fuller and heavier than the right breast.   LC reviewed breastfeeding basics and position options. LC assisted with pillows and supporting lactating parents comfort. LC reviewed latch basics to achieve a comfortable latch.  Feeding Plan: 1) Put baby to breast every 2-3 hours, or sooner if cueing. 2) Encourage a deep latch to improve comfort by using cross cradle position, tummy to tummy and nose to nipple.  3) Call RN or LC for breastfeeding assistance.  Maternal Data Does the patient have breastfeeding experience prior to this delivery?: Yes How long did the patient breastfeed?: Minimally  Feeding Mother's Current Feeding Choice: Breast Milk and Formula  LATCH Score Latch: Repeated attempts needed to sustain latch, nipple held in mouth throughout feeding, stimulation needed to elicit sucking reflex.  Audible Swallowing: A few with stimulation  Type of Nipple: Everted at rest and after stimulation  Comfort (Breast/Nipple): Soft / non-tender  Hold (Positioning): Assistance needed to correctly position infant at breast and maintain latch.  LATCH Score: 7   Lactation Tools Discussed/Used    Interventions Interventions: Breast feeding basics reviewed;Assisted with latch;Skin to skin;Hand express;Breast compression;Support pillows;Adjust position;Position options;Expressed milk;Coconut oil;Education  Discharge     Consult Status Consult Status: Follow-up Date: 08/02/23 Follow-up type: In-patient    Antionette Char 08/01/2023, 2:30 PM

## 2023-08-01 NOTE — Progress Notes (Signed)
POSTPARTUM PROGRESS NOTE  PPD #1  Subjective:  Bailey Palmer is a 27 y.o. G3P3003 s/p NSVD at [redacted]w[redacted]d. Today she notes she is doing well. She denies any problems with ambulating, voiding or po intake. Denies nausea or vomiting. She has passed flatus, no BM.  Pain is well controlled.  Lochia minimal/moderate Denies fever/chills/chest pain/SOB.   Objective: Blood pressure 113/74, pulse 66, temperature 98.7 F (37.1 C), temperature source Oral, resp. rate 16, height 5\' 3"  (1.6 m), last menstrual period 11/02/2022, SpO2 100%, unknown if currently breastfeeding.  Physical Exam:  General: alert, cooperative and no distress Chest: no respiratory distress Heart: regular rate and rhythm Abdomen: soft, nontender Uterine Fundus: firm, non-tender DVT Evaluation: No calf swelling or tenderness Extremities: no edema Skin: warm, dry  No results found for this or any previous visit (from the past 24 hour(s)).  Assessment/Plan: JASHAWN TELESCO is a 27 y.o. [phone] s/p NSVD at [redacted]w[redacted]d PPD#1 complicated by: -manual placenta extraction, s/p Ancef x 3 doses -pain well controlled, lochia appropriate -continue routine postpartum care  Contraception: patch at 6wks Feeding: breast/bottle  Dispo: Continue routine postpartum care, plan for discharge home tomorrow   LOS: 2 days   Myna Hidalgo, DO Faculty Attending, [facility] 08/01/2023, 7:27 AM

## 2023-08-01 NOTE — Anesthesia Postprocedure Evaluation (Signed)
Anesthesia Post Note  Patient: Bailey Palmer  Procedure(s) Performed: AN AD HOC LABOR EPIDURAL     Patient location during evaluation: Mother Baby Anesthesia Type: Epidural Level of consciousness: awake and alert Pain management: pain level controlled Vital Signs Assessment: post-procedure vital signs reviewed and stable Respiratory status: spontaneous breathing, nonlabored ventilation and respiratory function stable Cardiovascular status: stable Postop Assessment: no headache, no backache and epidural receding Anesthetic complications: no   No notable events documented.  Last Vitals:  Vitals:   07/31/23 2156 08/01/23 0622  BP: 118/78 113/74  Pulse: 83 66  Resp:    Temp: 36.6 C 37.1 C  SpO2: 100% 100%    Last Pain:  Vitals:   08/01/23 0622  TempSrc: Oral  PainSc:    Pain Goal: Patients Stated Pain Goal: 3 (07/31/23 1155)                 [facility] Records

## 2023-08-02 MED ORDER — ACETAMINOPHEN 325 MG PO TABS: 650.0000 mg | ORAL_TABLET | ORAL | Status: AC | PRN

## 2023-08-02 MED ORDER — IBUPROFEN 600 MG PO TABS: 600.0000 mg | ORAL_TABLET | Freq: Four times a day (QID) | ORAL | 0 refills | Status: DC

## 2023-08-03 ENCOUNTER — Encounter: Payer: Medicaid Other | Admitting: Women's Health

## 2023-08-11 ENCOUNTER — Encounter: Payer: Medicaid Other | Admitting: Women's Health

## 2023-08-11 ENCOUNTER — Other Ambulatory Visit: Payer: Medicaid Other

## 2023-08-20 ENCOUNTER — Encounter: Payer: Self-pay | Admitting: Women's Health

## 2023-08-31 ENCOUNTER — Telehealth (HOSPITAL_COMMUNITY): Payer: Self-pay | Admitting: *Deleted

## 2023-08-31 NOTE — Telephone Encounter (Signed)
08/31/2023  Name: Bailey Palmer MRN: [id number] DOB: Feb 01, 1996  Reason for Call:  Transition of Care Hospital Discharge Call  Contact Status: Patient Contact Status: Complete  Language assistant needed:          Follow-Up Questions: Do You Have Any Concerns About Your Health As You Heal From Delivery?: Yes What Concerns Do You Have About Your Health?: "Yes. My bleeding had stopped and now I am having bright red bleeding again. Is it my period? I'm filling a pad probably twice a day." Patient reported that she is not exclusively breastfeeding. RN instructed patient to contact her OB tomorrow to follow-up. Postpartum appointment is scheduled for 10/8. Patient voiced no other questions or concerns at this time. Do You Have Any Concerns About Your Infants Health?: No  Edinburgh Postnatal Depression Scale:  In the Past 7 Days:   EPDS not done at this time. Patient stated, "I completed that at the pediatrician's office. I'm not having any anxiety or depression." PHQ2-9 Depression Scale:     Discharge Follow-up: Edinburgh score requires follow up?: N/A Patient was advised of the following resources:: Breastfeeding Support Group, Support Group  Post-discharge interventions: Reviewed Newborn Safe Sleep Practices  Signature [place] H[place]les, [phone]

## 2023-09-10 ENCOUNTER — Encounter: Payer: Self-pay | Admitting: Women's Health

## 2023-09-10 ENCOUNTER — Ambulatory Visit: Payer: Medicaid Other | Admitting: Women's Health

## 2023-09-10 DIAGNOSIS — Z1330 Encounter for screening examination for mental health and behavioral disorders, unspecified: Secondary | ICD-10-CM

## 2023-09-10 DIAGNOSIS — Z30011 Encounter for initial prescription of contraceptive pills: Secondary | ICD-10-CM | POA: Diagnosis not present

## 2023-09-10 MED ORDER — LO LOESTRIN FE 1 MG-10 MCG / 10 MCG PO TABS: 1.0000 | ORAL_TABLET | Freq: Every day | ORAL | 3 refills | Status: DC

## 2023-09-10 NOTE — Progress Notes (Signed)
POSTPARTUM VISIT Patient name: Bailey Palmer MRN [id number]  Date of birth: 02/01/1996 Chief Complaint:   Postpartum Care (Want contraceptive patch. Sex last night)  History of Present Illness:   Bailey Palmer is a 27 y.o. 1990 female being seen today for a postpartum visit. She is 5 weeks postpartum following a spontaneous vaginal delivery at 38.5 gestational weeks. IOL: n/a, for n/a. Anesthesia: epidural.  Laceration: none.  Complications: avulsed cord w/ manual removal of placenta. Inpatient contraception: no.   Pregnancy uncomplicated. Tobacco use: no. Substance use disorder: no. Last pap smear: 04/17/21 and results were NILM w/ HRHPV not done. Next pap smear due: 2025 Patient's last menstrual period was 11/02/2022.  Postpartum course has been uncomplicated. Thinks she has hemorrhoid, some pain/bleeding w/ bm. Bleeding none. Bowel function is normal. Bladder function is normal. Urinary incontinence? no, fecal incontinence? no Patient is sexually active. Last sexual activity:  last night . Desired contraception: wants patch, discussed contraindicated w/ BMI >=30, wants COCs. Does not smoke, no h/o HTN, DVT/PE, CVA, MI, or migraines w/ aura. . Patient does not know want a pregnancy in the future.  Desired family size is uncertain #of children.   [facility] - 09/10/23 1355       Pregnancy Intention Screening   Does the patient want to become pregnant in the next year? No    Does the patient's partner want to become pregnant in the next year? No    Would the patient like to discuss contraceptive options today? Yes      Contraception Wrap Up   Current Method Contraceptive Patch    End Method Contraceptive Patch    Contraception Counseling Provided Yes            The pregnancy intention screening data noted above was reviewed. Potential methods of contraception were discussed. The patient elected to proceed with Contraceptive Patch.  Edinburgh Postpartum Depression Screening:  negative  Edinburgh Postnatal Depression Scale - 09/10/23 1351       Edinburgh Postnatal Depression Scale:  In the Past 7 Days   I have been able to laugh and see the funny side of things. 0    I have looked forward with enjoyment to things. 0    I have blamed myself unnecessarily when things went wrong. 0    I have been anxious or worried for no good reason. 0    I have felt scared or panicky for no good reason. 0    Things have been getting on top of me. 0    I have been so unhappy that I have had difficulty sleeping. 0    I have felt sad or miserable. 0    I have been so unhappy that I have been crying. 0    The thought of harming myself has occurred to me. 0    Edinburgh Postnatal Depression Scale Total 0                02/03/2023    2:26 PM 04/17/2021    8:48 AM  GAD 7 : Generalized Anxiety Score  Nervous, Anxious, on Edge 0 0  Control/stop worrying 0 0  Worry too much - different things 0 0  Trouble relaxing 0 0  Restless 0 0  Easily annoyed or irritable 0 0  Afraid - awful might happen 0 0  Total GAD 7 Score 0 0     Baby's course has been uncomplicated. Baby is feeding by bottle. Infant has a pediatrician/family  doctor? Yes.  Childcare strategy if returning to work/school: family.  Pt has material needs met for her and baby: Yes.   Review of Systems:   Pertinent items are noted in HPI Denies Abnormal vaginal discharge w/ itching/odor/irritation, headaches, visual changes, shortness of breath, chest pain, abdominal pain, severe nausea/vomiting, or problems with urination or bowel movements. Pertinent History Reviewed:  Reviewed past medical,surgical, obstetrical and family history.  Reviewed problem list, medications and allergies. OB History  Gravida Para Term Preterm AB Living  3 3 3     3   SAB IAB Ectopic Multiple Live Births        0 3    # Outcome Date GA Lbr Len/2nd Weight Sex Type Anes PTL Lv  3 Term 07/31/23 [redacted]w[redacted]d  7 lb 11.5 oz (3.5 kg) F Vag-Spont EPI   LIV  2 Term 07/04/18 [redacted]w[redacted]d 11:36 / 00:23 9 lb 1.7 oz (4.13 kg) M Vag-Spont EPI N LIV  1 Term 09/17/16 [redacted]w[redacted]d 13:25 / 00:33 8 lb 2 oz (3.685 kg) F Vag-Spont EPI N LIV   Physical Assessment:   Vitals:   09/10/23 1350  BP: 108/64  Pulse: 69  Weight: 178 lb (80.7 kg)  Height: 5\' 3"  (1.6 m)  Body mass index is 31.53 kg/m.       Physical Examination:   General appearance: alert, well appearing, and in no distress  Mental status: alert, oriented to person, place, and time  Skin: warm & dry   Cardiovascular: normal heart rate noted   Respiratory: normal respiratory effort, no distress   Breasts: deferred, no complaints   Abdomen: soft, non-tender   Pelvic: not indicated Thin prep pap obtained: No  Rectal:  declined by pt  Extremities: Edema: none   Chaperone: N/A         No results found for this or any previous visit (from the past 24 hour(s)).  Assessment & Plan:  1) Postpartum exam 2) 5 wks s/p spontaneous vaginal delivery 3) bottle feeding 4) Depression screening 5) Contraception management: rx LoLo, condoms x 2wks, f/u 6) Hemorrhoids> discussed OTC measures & staying unconstipated, let me know if not helping  Essential components of care per ACOG recommendations:  1.  Mood and well being:  If positive depression screen, discussed and plan developed.  If using tobacco we discussed reduction/cessation and risk of relapse If current substance abuse, we discussed and referral to local resources was offered.   2. Infant care and feeding:  If breastfeeding, discussed returning to work, pumping, breastfeeding-associated pain, guidance regarding return to fertility while lactating if not using another method. If needed, patient was provided with a letter to be allowed to pump q 2-3hrs to support lactation in a private location with access to a refrigerator to store breastmilk.   Recommended that all caregivers be immunized for flu, pertussis and other preventable communicable  diseases If pt does not have material needs met for her/baby, referred to local resources for help obtaining these.  3. Sexuality, contraception and birth spacing Provided guidance regarding sexuality, management of dyspareunia, and resumption of intercourse Discussed avoiding interpregnancy interval <25mths and recommended birth spacing of 18 months  4. Sleep and fatigue Discussed coping options for fatigue and sleep disruption Encouraged family/partner/community support of 4 hrs of uninterrupted sleep to help with mood and fatigue  5. Physical recovery  If pt had a C/S, assessed incisional pain and providing guidance on normal vs prolonged recovery If pt had a laceration, perineal healing and pain reviewed.  If urinary or fecal incontinence, discussed management and referred to PT or uro/gyn if indicated  Patient is safe to resume physical activity. Discussed attainment of healthy weight.  6.  Chronic disease management Discussed pregnancy complications if any, and their implications for future childbearing and long-term maternal health. Review recommendations for prevention of recurrent pregnancy complications, such as 17 hydroxyprogesterone caproate to reduce risk for recurrent PTB not applicable, or aspirin to reduce risk of preeclampsia not applicable. Pt had GDM: no. If yes, 2hr GTT scheduled: not applicable. Reviewed medications and non-pregnant dosing including consideration of whether pt is breastfeeding using a reliable resource such as LactMed: not applicable Referred for f/u w/ PCP or subspecialist providers as indicated: not applicable  7. Health maintenance Mammogram at 26yo or earlier if indicated Pap smears as indicated  Meds:  Meds ordered this encounter  Medications   LO LOESTRIN FE 1 MG-10 MCG / 10 MCG tablet    Sig: Take 1 tablet by mouth daily.    Dispense:  90 tablet    Refill:  3    For co-pay card, pt to text "Lo Loestrin Fe " to [phone]              Co-pay  card must be run in second position  "other coverage code 3"  if denied d/t PA, step edit, or insurance denial    Follow-up: Return in about 3 months (around 12/11/2023) for med f/u, CNM, in person; then 73yr physical.   No orders of the defined types were placed in this encounter.   Cheral Marker CNM, [facility] 09/10/2023 2:30 PM

## 2023-09-15 ENCOUNTER — Ambulatory Visit: Payer: Medicaid Other | Admitting: Women's Health

## 2023-09-17 ENCOUNTER — Ambulatory Visit: Payer: Medicaid Other | Admitting: Advanced Practice Midwife

## 2023-10-16 ENCOUNTER — Ambulatory Visit: Payer: Self-pay

## 2023-10-16 ENCOUNTER — Ambulatory Visit
Admission: EM | Admit: 2023-10-16 | Discharge: 2023-10-16 | Disposition: A | Discharge status: Home / Self Care | Payer: Medicaid Other | Attending: Nurse Practitioner | Admitting: Nurse Practitioner

## 2023-10-16 DIAGNOSIS — Z8669 Personal history of other diseases of the nervous system and sense organs: Secondary | ICD-10-CM

## 2023-10-16 DIAGNOSIS — M545 Low back pain, unspecified: Secondary | ICD-10-CM

## 2023-10-16 MED ORDER — KETOROLAC TROMETHAMINE 30 MG/ML IJ SOLN
30.0000 mg | Freq: Once | INTRAMUSCULAR | Status: AC
  Administered 2023-10-16: 30 mg via INTRAMUSCULAR

## 2023-10-16 MED ORDER — IBUPROFEN 800 MG PO TABS: 800.0000 mg | ORAL_TABLET | Freq: Three times a day (TID) | ORAL | 0 refills | Status: DC

## 2023-10-16 MED ORDER — DEXAMETHASONE SODIUM PHOSPHATE 10 MG/ML IJ SOLN
10.0000 mg | INTRAMUSCULAR | Status: AC
  Administered 2023-10-16: 10 mg via INTRAMUSCULAR

## 2023-10-16 NOTE — ED Provider Notes (Signed)
[facility]    CSN: [id number] Arrival date & time: 10/16/23  1203      History   Chief Complaint No chief complaint on file.   HPI Bailey Palmer is a 27 y.o. female.   The history is provided by the patient.   Patient presents for complaints of low back pain is been present for the past 2 to 3 days.  Patient reports a prior history of sciatica and that her symptoms feel very similar.  States pain is located in the mid to left lower back.  Patient denies fever, chills, radiation of pain, numbness, tingling, lower extremity weakness, or loss of bowel or bladder function.  Pain worsens with certain movements such as bending and twisting.  Patient also denies prior injury or trauma.  Patient reports that she usually receives a steroid shot and takes ibuprofen for her symptoms.  Patient reports she has taken ibuprofen at home for her symptoms with minimal relief.  Patient is approximately 10 weeks postpartum, reports she is not breast-feeding. Past Medical History:  Diagnosis Date   Anemia    IDA   Chronic abdominal pain    Cyst near tailbone    DUB (dysfunctional uterine bleeding)    Gonorrhea 07/04/2022   07/04/22 to get rocephin today in office, POC________   Heart murmur    Trichimoniasis 08/08/2020   Treated with flagyl for BV on 8/31 which will cover trich too.     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 Authorizing Provider  ibuprofen (ADVIL) 800 MG tablet Take 1 tablet (800 mg total) by mouth 3 (three) times daily. 10/16/23  Yes Leath-Warren, Sadie Haber, NP  acetaminophen (TYLENOL) 325 MG tablet Take 2 tablets (650 mg total) by mouth every 4 (four) hours as needed (for pain scale < 4). Patient not taking: Reported on 09/10/2023 08/02/23   Lazaro Arms, MD  Blood Pressure Monitor MISC For regular home bp monitoring during pregnancy Patient not taking: Reported on 09/10/2023 02/03/23   Cheral Marker, CNM  ferrous sulfate 325 (65 FE) MG tablet Take 1 tablet (325 mg total) by mouth every other day. Patient not taking: Reported on 09/10/2023 05/19/23   Cheral Marker, CNM  LO LOESTRIN FE 1 MG-10 MCG / 10 MCG tablet Take 1 tablet by mouth daily. 09/10/23   Cheral Marker, CNM  Prenat-FeCb-FeFum-FA-DHA w/o A (CITRANATAL MEDLEY) 27-1-200 MG CAPS Take 1 capsule by mouth at bedtime.  Patient not taking: Reported on 09/10/2023 12/04/22   Adline Potter, NP    Family History Family History  Problem Relation Age of Onset   Asthma Mother    Miscarriages / [place] Mother    Varicose Veins Mother    Alpha-1 antitrypsin deficiency Mother        carrier   ADD / ADHD Brother    Diabetes Maternal Grandmother    Hypertension Maternal Grandmother    Heart disease Maternal Grandmother    Asthma Maternal Aunt    Alpha-1 antitrypsin deficiency Maternal Aunt    Depression Maternal Aunt     Social History Social History   Tobacco Use   Smoking status: Never   Smokeless tobacco: Never  Vaping Use   Vaping status: Never Used  Substance Use Topics   Alcohol use: No    Alcohol/week: 0.0 standard drinks of alcohol   Drug use: No     Allergies   Patient has no known allergies.   Review of Systems Review of Systems Per  HPI  Physical Exam Triage Vital Signs ED Triage Vitals  Encounter Vitals Group     BP 10/16/23 1239 117/67     Systolic BP Percentile --      Diastolic BP Percentile --      Pulse Rate 10/16/23 1239 82     Resp 10/16/23 1239 20     Temp 10/16/23 1239 99.2 F (37.3 C)     Temp Source 10/16/23 1239 Oral     SpO2 10/16/23 1239 99 %     Weight --      Height --      Head Circumference --      Peak Flow --      Pain Score 10/16/23 1240 7     Pain Loc --      Pain Education --      Exclude from Growth Chart --    No data found.  Updated Vital Signs BP 117/67 (BP Location: Right Arm)   Pulse 82   Temp 99.2 F (37.3 C) (Oral)   Resp 20   LMP 10/15/2023 Comment: start  SpO2 99%   Breastfeeding No   Visual Acuity Right Eye Distance:   Left Eye Distance:   Bilateral Distance:    Right Eye Near:   Left Eye Near:    Bilateral Near:     Physical Exam Vitals and nursing note reviewed.  Constitutional:      General: She is not in acute distress.    Appearance: Normal appearance.  HENT:     Head: Normocephalic.  Eyes:     Extraocular Movements: Extraocular movements intact.     Pupils: Pupils are equal, round, and reactive to light.  Cardiovascular:     Rate and Rhythm: Normal rate and regular rhythm.     Pulses: Normal pulses.     Heart sounds: Normal heart sounds.  Pulmonary:     Effort: Pulmonary effort is normal. No respiratory distress.     Breath sounds: Normal breath sounds. No stridor. No wheezing, rhonchi or rales.  Abdominal:     General: Bowel sounds are normal.     Palpations: Abdomen is soft.     Tenderness: There is no abdominal tenderness.  Musculoskeletal:     Cervical back: Normal range of motion.     Lumbar back: Tenderness (mid/left lower spine at L3-L5) present. No swelling or spasms. Decreased range of motion. Negative right straight leg raise test.  Lymphadenopathy:  Cervical: No cervical adenopathy.  Skin:    General: Skin is warm and  dry.  Neurological:     General: No focal deficit present.     Mental Status: She is alert and oriented to person, place, and time.  Psychiatric:        Mood and Affect: Mood normal.        Behavior: Behavior normal.      UC Treatments / Results  Labs (all labs ordered are listed, but only abnormal results are displayed) Labs Reviewed - No data to display  EKG   Radiology No results found.  Procedures Procedures (including critical care time)  Medications Ordered in UC Medications  ketorolac (TORADOL) 30 MG/ML injection 30 mg (30 mg Intramuscular Given 10/16/23 1319)  dexamethasone (DECADRON) injection 10 mg (10 mg Intramuscular Given 10/16/23 1319)    Initial Impression / Assessment and Plan / UC Course  I have reviewed the triage vital signs and the nursing notes.  Pertinent labs & imaging results that were available during my care of the patient were reviewed by me and considered in my medical decision making (see chart for details).  Patient with pain in the mid/left lower back.  Patient with prior history of sciatica.  Patient reports symptoms feel very similar to previous episodes.  Decadron 10 mg IM and Toradol 30 mg IM administered.  Ibuprofen 800 mg prescribed for pain.  Supportive care recommendations were provided and discussed with the patient to include stretching and the use of ice or heat as needed.  Patient was given strict ER follow-up precautions.  Patient advised to follow-up with her PCP if symptoms fail to improve with this treatment.  Patient was in agreement with this plan of care and verbalizes understanding.  All questions were answered.  Patient stable for discharge.  Final Clinical Impressions(s) / UC Diagnoses   Final diagnoses:  Low back pain without sciatica, unspecified back pain laterality, unspecified chronicity  History of sciatica     Discharge Instructions      Take medication as prescribed. Try to remain as active as possible.    Gentle range of motion and stretching exercises to help with back spasm and pain. I have provided some stretching exercises for you to perform at home.  Try to perform the exercises at least 2-3 times daily while symptoms persist. May apply ice or heat as needed.  Ice is recommended for pain or swelling, heat for spasm or stiffness.  Apply for 20 minutes, remove for 1 hour, then repeat. May take over-the-counter Tylenol extra strength 500 mg tablet approximately 1 -2 hours after taking ibuprofen for breakthrough pain. Go to the emergency department immediately if you develop weakness in your legs or feet, inability to walk, loss of bowel or bladder function, difficulty urinating or passing a bowel movement, or other concerns. Follow-up with your primary care physician if symptoms fail to improve with this treatment. Follow-up as needed.     ED Prescriptions     Medication Sig Dispense Auth. Provider   ibuprofen (ADVIL) 800 MG tablet Take 1 tablet (800 mg total) by mouth 3 (three) times daily. 21 tablet Leath-Warren, Sadie Haber, NP      PDMP not reviewed this encounter.   Abran Cantor, NP 10/16/23 1334

## 2023-10-16 NOTE — Discharge Instructions (Signed)
Take medication as prescribed. Try to remain as active as possible.   Gentle range of motion and stretching exercises to help with back spasm and pain. I have provided some stretching exercises for you to perform at home.  Try to perform the exercises at least 2-3 times daily while symptoms persist. May apply ice or heat as needed.  Ice is recommended for pain or swelling, heat for spasm or stiffness.  Apply for 20 minutes, remove for 1 hour, then repeat. May take over-the-counter Tylenol extra strength 500 mg tablet approximately 1 -2 hours after taking ibuprofen for breakthrough pain. Go to the emergency department immediately if you develop weakness in your legs or feet, inability to walk, loss of bowel or bladder function, difficulty urinating or passing a bowel movement, or other concerns. Follow-up with your primary care physician if symptoms fail to improve with this treatment. Follow-up as needed.

## 2023-10-16 NOTE — ED Triage Notes (Signed)
Pt reports low back pain x 2 days.  Possible"flea" bite check all over her body x 3 days

## 2023-11-14 ENCOUNTER — Emergency Department (HOSPITAL_COMMUNITY)
Admission: EM | Admit: 2023-11-14 | Discharge: 2023-11-14 | Discharge status: Against Medical Advice | Payer: No Typology Code available for payment source | Attending: Emergency Medicine | Admitting: Emergency Medicine

## 2023-11-14 ENCOUNTER — Encounter (HOSPITAL_COMMUNITY): Payer: Self-pay | Admitting: *Deleted

## 2023-11-14 ENCOUNTER — Emergency Department (HOSPITAL_COMMUNITY): Payer: No Typology Code available for payment source

## 2023-11-14 ENCOUNTER — Other Ambulatory Visit: Payer: Self-pay

## 2023-11-14 DIAGNOSIS — Y92410 Unspecified street and highway as the place of occurrence of the external cause: Secondary | ICD-10-CM | POA: Insufficient documentation

## 2023-11-14 DIAGNOSIS — Z5329 Procedure and treatment not carried out because of patient's decision for other reasons: Secondary | ICD-10-CM | POA: Diagnosis not present

## 2023-11-14 DIAGNOSIS — M545 Low back pain, unspecified: Secondary | ICD-10-CM | POA: Insufficient documentation

## 2023-11-14 DIAGNOSIS — R079 Chest pain, unspecified: Secondary | ICD-10-CM | POA: Insufficient documentation

## 2023-11-14 LAB — BASIC METABOLIC PANEL
Anion gap: 8 (ref 5–15)
BUN: 12 mg/dL (ref 6–20)
CO2: 23 mmol/L (ref 22–32)
Calcium: 8.9 mg/dL (ref 8.9–10.3)
Chloride: 105 mmol/L (ref 98–111)
Creatinine, Ser: 0.47 mg/dL (ref 0.44–1.00)
GFR, Estimated: >60 mL/min (ref >60)
Glucose, Bld: 84 mg/dL (ref 70–99)
Potassium: 3.5 mmol/L (ref 3.5–5.1)
Sodium: 136 mmol/L (ref 135–145)

## 2023-11-14 LAB — CBC WITH DIFFERENTIAL/PLATELET
Abs Immature Granulocytes: 0.02 10*3/uL (ref 0.00–0.07)
Basophils Absolute: 0 10*3/uL (ref 0.0–0.1)
Basophils Relative: 0 %
Eosinophils Absolute: 0.2 10*3/uL (ref 0.0–0.5)
Eosinophils Relative: 2 %
HCT: 32 % — ABNORMAL LOW (ref 36.0–46.0)
Hemoglobin: 9.7 g/dL — ABNORMAL LOW (ref 12.0–15.0)
Immature Granulocytes: 0 %
Lymphocytes Relative: 23 %
Lymphs Abs: 2.1 10*3/uL (ref 0.7–4.0)
MCH: 24.5 pg — ABNORMAL LOW (ref 26.0–34.0)
MCHC: 30.3 g/dL (ref 30.0–36.0)
MCV: 80.8 fL (ref 80.0–100.0)
Monocytes Absolute: 0.5 10*3/uL (ref 0.1–1.0)
Monocytes Relative: 5 %
Neutro Abs: 6.5 10*3/uL (ref 1.7–7.7)
Neutrophils Relative %: 70 %
Platelets: 248 10*3/uL (ref 150–400)
RBC: 3.96 MIL/uL (ref 3.87–5.11)
RDW: 15.3 % (ref 11.5–15.5)
WBC: 9.3 10*3/uL (ref 4.0–10.5)
nRBC: 0 % (ref 0.0–0.2)

## 2023-11-14 LAB — GROUP A STREP BY PCR: Group A Strep by PCR: NOT DETECTED

## 2023-11-14 LAB — HCG, QUANTITATIVE, PREGNANCY: hCG, Beta Chain, Quant, S: <1 m[IU]/mL (ref <5)

## 2023-11-14 MED ORDER — IOHEXOL 300 MG/ML  SOLN: 100.0000 mL | Freq: Once | INTRAMUSCULAR | Status: DC | PRN
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
## 2023-11-14 NOTE — ED Notes (Signed)
Lab has been contacted x3 with no results or time estimate on preg test. CT to wait for result
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
## 2023-12-09 NOTE — L&D Delivery Note (Signed)
 [facility] Delivery Note  Bailey Palmer is a 28 y.o. [id number] s/p vag delivery at [redacted]w[redacted]d. She was admitted for IOL due to potentially prolonged PROM x 48h.   ROM: 47h 85m with clear fluid GBS Status: neg Maximum Maternal Temperature: 98  Labor Progress: Bailey Palmer was admitted to L&D with possibly leaking fluid since 2200 on 9/29; she had Pitocin  for induction and progressed to complete; she pushed with a couple of ctx to vag delivery.  Delivery Date/Time: October 1st, 2025 at 2110 Delivery: Called to room and patient was complete; she began pushing, and head delivered ROA. Nuchal cord present x 1 and reduced easily. Shoulder and body weren't delivering, so posterior arm (right) swept by face and delivered, which created room for left ant shoulder to be easily released. Infant briefly placed on pt's abd; decreased tone resulted in decision to clamp/cut the cord and take the infant to the warmer for stimulation; she began crying soon after stimulated. Cord blood drawn. Placenta delivered spontaneously with gentle cord traction; piece of trailing membrane broke from the placenta and was teased out with a ring forceps. Fundus firm with massage and Pitocin . Labia, perineum, vagina, and cervix inspected and found to be intact.   Placenta: spont, intact (separate section about 8 of trailing membrane) Complications: tight shoulders; <66min head to body time Lacerations: none EBL: 150cc Analgesia: epidural  Postpartum Planning [x]  message to sent to schedule follow-up   Infant: girl  APGARs 7/9  4250g (9lb 5.9oz)  Suzen JONETTA Gentry, CNM  09/07/2024 10:05 PM

## 2023-12-29 DIAGNOSIS — G5600 Carpal tunnel syndrome, unspecified upper limb: Secondary | ICD-10-CM | POA: Diagnosis not present

## 2023-12-29 DIAGNOSIS — Z6832 Body mass index (BMI) 32.0-32.9, adult: Secondary | ICD-10-CM | POA: Diagnosis not present

## 2023-12-29 DIAGNOSIS — E6609 Other obesity due to excess calories: Secondary | ICD-10-CM | POA: Diagnosis not present

## 2024-02-11 ENCOUNTER — Other Ambulatory Visit: Payer: Self-pay | Admitting: Adult Health

## 2024-02-11 ENCOUNTER — Ambulatory Visit

## 2024-02-11 ENCOUNTER — Other Ambulatory Visit: Payer: Self-pay | Admitting: Obstetrics & Gynecology

## 2024-02-11 ENCOUNTER — Encounter: Payer: Self-pay | Admitting: *Deleted

## 2024-02-11 VITALS — BP 125/71 | HR 85 | Ht 63.0 in | Wt 193.5 lb

## 2024-02-11 DIAGNOSIS — Z3A08 8 weeks gestation of pregnancy: Secondary | ICD-10-CM

## 2024-02-11 DIAGNOSIS — O3680X0 Pregnancy with inconclusive fetal viability, not applicable or unspecified: Secondary | ICD-10-CM

## 2024-02-11 DIAGNOSIS — Z3491 Encounter for supervision of normal pregnancy, unspecified, first trimester: Secondary | ICD-10-CM

## 2024-02-11 DIAGNOSIS — Z3201 Encounter for pregnancy test, result positive: Secondary | ICD-10-CM | POA: Diagnosis not present

## 2024-02-11 LAB — POCT URINE PREGNANCY: Preg Test, Ur: POSITIVE — AB

## 2024-02-11 MED ORDER — PRENATAL GUMMIES 0.18-25 MG PO CHEW: CHEWABLE_TABLET | ORAL | 99 refills | Status: AC

## 2024-02-11 NOTE — Progress Notes (Signed)
 [place] 8+5 wks,single IUP with yolk sac,CRL 22.74 mm,FHR 167 bpm,normal ovaries

## 2024-02-11 NOTE — Progress Notes (Signed)
 Rx PNV Gummies

## 2024-02-11 NOTE — Progress Notes (Signed)
   NURSE VISIT- PREGNANCY CONFIRMATION   SUBJECTIVE:  Bailey Palmer is a 28 y.o. [phone] female at [redacted]w[redacted]d by certain LMP of Patient's last menstrual period was 12/12/2023. Here for pregnancy confirmation.  Home pregnancy test: positive x 1.   She reports  nausea and cramping .  She is not taking prenatal vitamins.    OBJECTIVE:  BP 125/71 (BP Location: Left Arm, Patient Position: Sitting, Cuff Size: Normal)   Pulse 85   Ht 5\' 3"  (1.6 m)   Wt 193 lb 8 oz (87.8 kg)   LMP 12/12/2023   Breastfeeding No   BMI 34.28 kg/m   Appears well, in no apparent distress  Results for orders placed or performed in visit on 02/11/24 (from the past 24 hours)  POCT urine pregnancy   Collection Time: 02/11/24 11:19 AM  Result Value Ref Range   Preg Test, Ur Positive (A) Negative    ASSESSMENT: Positive pregnancy test, [redacted]w[redacted]d by LMP    PLAN: Schedule for dating ultrasound ASAP!!! Prenatal vitamins: note routed to [facility] to send prescription. Pt would like gummies if possible.   Nausea medicines: not currently needed   OB packet given: Yes  Malachy Mood  02/11/2024 11:24 AM

## 2024-02-17 ENCOUNTER — Ambulatory Visit: Payer: Self-pay

## 2024-02-17 DIAGNOSIS — H6691 Otitis media, unspecified, right ear: Secondary | ICD-10-CM | POA: Diagnosis not present

## 2024-02-17 DIAGNOSIS — J069 Acute upper respiratory infection, unspecified: Secondary | ICD-10-CM | POA: Diagnosis not present

## 2024-02-17 DIAGNOSIS — J029 Acute pharyngitis, unspecified: Secondary | ICD-10-CM | POA: Diagnosis not present

## 2024-03-07 ENCOUNTER — Other Ambulatory Visit: Payer: Self-pay | Admitting: Obstetrics & Gynecology

## 2024-03-07 ENCOUNTER — Encounter: Payer: Self-pay | Admitting: Women's Health

## 2024-03-07 DIAGNOSIS — Z3682 Encounter for antenatal screening for nuchal translucency: Secondary | ICD-10-CM

## 2024-03-07 DIAGNOSIS — Z3490 Encounter for supervision of normal pregnancy, unspecified, unspecified trimester: Secondary | ICD-10-CM | POA: Insufficient documentation

## 2024-03-08 ENCOUNTER — Ambulatory Visit (INDEPENDENT_AMBULATORY_CARE_PROVIDER_SITE_OTHER): Admitting: Women's Health

## 2024-03-08 ENCOUNTER — Ambulatory Visit

## 2024-03-08 ENCOUNTER — Encounter: Payer: Self-pay | Admitting: Women's Health

## 2024-03-08 ENCOUNTER — Encounter: Admitting: *Deleted

## 2024-03-08 VITALS — BP 112/69 | HR 78 | Wt 195.0 lb

## 2024-03-08 DIAGNOSIS — Z3682 Encounter for antenatal screening for nuchal translucency: Secondary | ICD-10-CM

## 2024-03-08 DIAGNOSIS — Z3A12 12 weeks gestation of pregnancy: Secondary | ICD-10-CM

## 2024-03-08 DIAGNOSIS — Z3480 Encounter for supervision of other normal pregnancy, unspecified trimester: Secondary | ICD-10-CM

## 2024-03-08 DIAGNOSIS — Z6834 Body mass index (BMI) 34.0-34.9, adult: Secondary | ICD-10-CM

## 2024-03-08 DIAGNOSIS — Z1332 Encounter for screening for maternal depression: Secondary | ICD-10-CM

## 2024-03-08 DIAGNOSIS — Z131 Encounter for screening for diabetes mellitus: Secondary | ICD-10-CM

## 2024-03-08 DIAGNOSIS — Z3481 Encounter for supervision of other normal pregnancy, first trimester: Secondary | ICD-10-CM

## 2024-03-08 NOTE — Progress Notes (Signed)
 [place] 12+3 wks,measurements c/w dates,CRL 69.61 mm,normal ovaries,NB present,NT 1.9 mm,FHR 153 bpm,posterior placenta

## 2024-03-08 NOTE — Progress Notes (Signed)
 INITIAL OBSTETRICAL VISIT Patient name: Bailey Palmer MRN [id number]  Date of birth: 02/13/1996 Chief Complaint:   Initial Prenatal Visit  History of Present Illness:   Bailey Palmer is a 28 y.o. G30P3003 female at [redacted]w[redacted]d by LMP c/w u/s at 9 weeks with an Estimated Date of Delivery: 09/17/24 being seen today for her initial obstetrical visit.   Patient's last menstrual period was 12/12/2023. Her obstetrical history is significant for  term SVB x 3, 2nd baby 9lb1.7oz (no SD or GDM) .   Today she reports  nausea, no vomiting. Declines meds .  Last pap 04/17/21. Results were: NILM w/ HRHPV not done     03/08/2024    8:36 AM 05/18/2023    9:06 AM 02/03/2023    2:26 PM 04/17/2021    8:48 AM 02/16/2020   11:26 AM  Depression screen PHQ 2/9  Decreased Interest 0 0 0 0 0  Down, Depressed, Hopeless 0 0 0 0 0  PHQ - 2 Score 0 0 0 0 0  Altered sleeping 0 1 1 1    Tired, decreased energy 1  0 0   Change in appetite 2 0 0 0   Feeling bad or failure about yourself  0 0 0 0   Trouble concentrating 0 0 0 0   Moving slowly or fidgety/restless 0 0 0 0   Suicidal thoughts 0 0 0 0   PHQ-9 Score 3 1 1 1    Difficult doing work/chores  Not difficult at all           03/08/2024    8:36 AM 02/03/2023    2:26 PM 04/17/2021    8:48 AM  GAD 7 : Generalized Anxiety Score  Nervous, Anxious, on Edge 0 0 0  Control/stop worrying 0 0 0  Worry too much - different things 0 0 0  Trouble relaxing 0 0 0  Restless 0 0 0  Easily annoyed or irritable 0 0 0  Afraid - awful might happen 0 0 0  Total GAD 7 Score 0 0 0     Review of Systems:   Pertinent items are noted in HPI Denies cramping/contractions, leakage of fluid, vaginal bleeding, abnormal vaginal discharge w/ itching/odor/irritation, headaches, visual changes, shortness of breath, chest pain, abdominal pain, severe nausea/vomiting, or problems with urination or bowel movements unless otherwise stated above.  Pertinent History Reviewed:  Reviewed  past medical,surgical, social, obstetrical and family history.  Reviewed problem list, medications and allergies. OB History  Gravida Para Term Preterm AB Living  4 3 3   3   SAB IAB Ectopic Multiple Live Births     0 3    # Outcome Date GA Lbr Len/2nd Weight Sex Type Anes PTL Lv  4 Current           3 Term 07/31/23 [redacted]w[redacted]d  7 lb 11.5 oz (3.5 kg) F Vag-Spont EPI  LIV  2 Term 07/04/18 [redacted]w[redacted]d 11:36 / 00:23 9 lb 1.7 oz (4.13 kg) M Vag-Spont EPI N LIV  1 Term 09/17/16 [redacted]w[redacted]d 13:25 / 00:33 8 lb 2 oz (3.685 kg) F Vag-Spont EPI N LIV   Physical Assessment:   Vitals:   03/08/24 0912  BP: 112/69  Pulse: 78  Weight: 195 lb (88.5 kg)  Body mass index is 34.54 kg/m.       Physical Examination:  General appearance - well appearing, and in no distress  Mental status - alert, oriented to person, place, and time  Psych:  She has a normal mood and affect  Skin - warm and dry, normal color, no suspicious lesions noted  Chest - effort normal, all lung fields clear to auscultation bilaterally  Heart - normal rate and regular rhythm  Abdomen - soft, nontender  Extremities:  No swelling or varicosities noted  Thin prep pap is not done   Chaperone: N/A  TODAY'S NT [place] 12+3 wks,measurements c/w dates,CRL 69.61 mm,normal ovaries,NB present,NT 1.9 mm,FHR 153 bpm,posterior placenta   No results found for this or any previous visit (from the past 24 hours).  Assessment & Plan:  1) Low-Risk Pregnancy G4P3003 at [redacted]w[redacted]d with an Estimated Date of Delivery: 09/17/24   2) Initial OB visit  3) H/O LGA> no dystocia or DM  Meds: No orders of the defined types were placed in this encounter.   Initial labs obtained Continue prenatal vitamins Reviewed n/v relief measures and warning s/s to report Reviewed recommended weight gain based on pre-gravid BMI Encouraged well-balanced diet Genetic & carrier screening discussed: requests Panorama and NT/IT, declines Horizon  Ultrasound discussed; fetal survey:  requested CCNC completed> form faxed if has or is planning to apply for medicaid The nature of [facility] [employer] with multiple MDs and other Advanced Practice Providers was explained to patient; also emphasized that fellows, residents, and students are part of our team. Does have home bp cuff. Office bp cuff given: no. Rx sent: n/a. Check bp weekly, let us know if consistently >140/90.   Follow-up: Return in about 4 weeks (around 04/05/2024) for LROB, 2nd IT, CNM, in person; then 8wks from now anatomy u/s and LROB w/ CNM.   Orders Placed This Encounter  Procedures   Urine Culture   GC/Chlamydia Probe Amp   Integrated 1   Hemoglobin A1c   PANORAMA PRENATAL TEST   CBC/D/Plt+RPR+Rh+ABO+RubIgG...    Cheral Marker CNM, [facility] 03/08/2024 9:30 AM

## 2024-03-08 NOTE — Patient Instructions (Signed)
Bailey Palmer, thank you for choosing our office today! We appreciate the opportunity to meet your healthcare needs. You may receive a short survey by mail, e-mail, or through [employer]. If you are happy with your care we would appreciate if you could take just a few minutes to complete the survey questions. We read all of your comments and take your feedback very seriously. Thank you again for choosing our office.  [facility] at [place] at [facility] ([place], [place] [address]) Entrance C, located off of [address] parking   Nausea & Vomiting Have saltine crackers or pretzels by your bed and eat a few bites before you raise your head out of bed in the morning Eat small frequent meals throughout the day instead of large meals Drink plenty of fluids throughout the day to stay hydrated, just don't drink a lot of fluids with your meals.  This can make your stomach fill up faster making you feel sick Do not brush your teeth right after you eat Products with real ginger are good for nausea, like ginger ale and ginger hard candy Make sure it says made with real ginger! Sucking on sour candy like lemon heads is also good for nausea If your prenatal vitamins make you nauseated, take them at night so you will sleep through the nausea Sea Bands If you feel like you need medicine for the nausea & vomiting please let us know If you are unable to keep any fluids or food down please let us know   Constipation Drink plenty of fluid, preferably water, throughout the day Eat foods high in fiber such as fruits, vegetables, and grains Exercise, such as walking, is a good way to keep your bowels regular Drink warm fluids, especially warm prune juice, or decaf coffee Eat a 1/2 cup of real oatmeal (not instant), 1/2 cup applesauce, and 1/2-1 cup warm prune juice every day If needed, you may take Colace (docusate sodium) stool  softener once or twice a day to help keep the stool soft.  If you still are having problems with constipation, you may take Miralax once daily as needed to help keep your bowels regular.   Home Blood Pressure Monitoring for Patients   Your provider has recommended that you check your blood pressure (BP) at least once a week at home. If you do not have a blood pressure cuff at home, one will be provided for you. Contact your provider if you have not received your monitor within 1 week.   Helpful Tips for Accurate Home Blood Pressure Checks  Don't smoke, exercise, or drink caffeine 30 minutes before checking your BP Use the restroom before checking your BP (a full bladder can raise your pressure) Relax in a comfortable upright chair Feet on the ground Left arm resting comfortably on a flat surface at the level of your heart Legs uncrossed Back supported Sit quietly and don't talk Place the cuff on your bare arm Adjust snuggly, so that only two fingertips can fit between your skin and the top of the cuff Check 2 readings separated by at least one minute Keep a log of your BP readings For a visual, please reference this diagram: http://ccnc.care/bpdiagram  Provider Name: [facility]     Phone: [phone]  Zone 1: ALL CLEAR  Continue to monitor your symptoms:  BP reading is less than 140 (top number) or less than 90 (bottom  number)  No right upper stomach pain No headaches or seeing spots No feeling nauseated or throwing up No swelling in face and hands  Zone 2: CAUTION Call your doctor's office for any of the following:  BP reading is greater than 140 (top number) or greater than 90 (bottom number)  Stomach pain under your ribs in the middle or right side Headaches or seeing spots Feeling nauseated or throwing up Swelling in face and hands  Zone 3: EMERGENCY  Seek immediate medical care if you have any of the following:  BP reading is greater than160 (top number) or  greater than 110 (bottom number) Severe headaches not improving with Tylenol Serious difficulty catching your breath Any worsening symptoms from Zone 2    First Trimester of Pregnancy The first trimester of pregnancy is from week 1 until the end of week 12 (months 1 through 3). A week after a sperm fertilizes an egg, the egg will implant on the wall of the uterus. This embryo will begin to develop into a baby. Genes from you and your partner are forming the baby. The female genes determine whether the baby is a boy or a girl. At 6-8 weeks, the eyes and face are formed, and the heartbeat can be seen on ultrasound. At the end of 12 weeks, all the baby's organs are formed.  Now that you are pregnant, you will want to do everything you can to have a healthy baby. Two of the most important things are to get good prenatal care and to follow your health care provider's instructions. Prenatal care is all the medical care you receive before the baby's birth. This care will help prevent, find, and treat any problems during the pregnancy and childbirth. BODY CHANGES Your body goes through many changes during pregnancy. The changes vary from woman to woman.  You may gain or lose a couple of pounds at first. You may feel sick to your stomach (nauseous) and throw up (vomit). If the vomiting is uncontrollable, call your health care provider. You may tire easily. You may develop headaches that can be relieved by medicines approved by your health care provider. You may urinate more often. Painful urination may mean you have a bladder infection. You may develop heartburn as a result of your pregnancy. You may develop constipation because certain hormones are causing the muscles that push waste through your intestines to slow down. You may develop hemorrhoids or swollen, bulging veins (varicose veins). Your breasts may begin to grow larger and become tender. Your nipples may stick out more, and the tissue that  surrounds them (areola) may become darker. Your gums may bleed and may be sensitive to brushing and flossing. Dark spots or blotches (chloasma, mask of pregnancy) may develop on your face. This will likely fade after the baby is born. Your menstrual periods will stop. You may have a loss of appetite. You may develop cravings for certain kinds of food. You may have changes in your emotions from day to day, such as being excited to be pregnant or being concerned that something may go wrong with the pregnancy and baby. You may have more vivid and strange dreams. You may have changes in your hair. These can include thickening of your hair, rapid growth, and changes in texture. Some women also have hair loss during or after pregnancy, or hair that feels dry or thin. Your hair will most likely return to normal after your baby is born. WHAT TO EXPECT AT YOUR PRENATAL  VISITS During a routine prenatal visit: You will be weighed to make sure you and the baby are growing normally. Your blood pressure will be taken. Your abdomen will be measured to track your baby's growth. The fetal heartbeat will be listened to starting around week 10 or 12 of your pregnancy. Test results from any previous visits will be discussed. Your health care provider may ask you: How you are feeling. If you are feeling the baby move. If you have had any abnormal symptoms, such as leaking fluid, bleeding, severe headaches, or abdominal cramping. If you have any questions. Other tests that may be performed during your first trimester include: Blood tests to find your blood type and to check for the presence of any previous infections. They will also be used to check for low iron levels (anemia) and Rh antibodies. Later in the pregnancy, blood tests for diabetes will be done along with other tests if problems develop. Urine tests to check for infections, diabetes, or protein in the urine. An ultrasound to confirm the proper growth  and development of the baby. An amniocentesis to check for possible genetic problems. Fetal screens for spina bifida and Down syndrome. You may need other tests to make sure you and the baby are doing well. HOME CARE INSTRUCTIONS  Medicines Follow your health care provider's instructions regarding medicine use. Specific medicines may be either safe or unsafe to take during pregnancy. Take your prenatal vitamins as directed. If you develop constipation, try taking a stool softener if your health care provider approves. Diet Eat regular, well-balanced meals. Choose a variety of foods, such as meat or vegetable-based protein, fish, milk and low-fat dairy products, vegetables, fruits, and whole grain breads and cereals. Your health care provider will help you determine the amount of weight gain that is right for you. Avoid raw meat and uncooked cheese. These carry germs that can cause birth defects in the baby. Eating four or five small meals rather than three large meals a day may help relieve nausea and vomiting. If you start to feel nauseous, eating a few soda crackers can be helpful. Drinking liquids between meals instead of during meals also seems to help nausea and vomiting. If you develop constipation, eat more high-fiber foods, such as fresh vegetables or fruit and whole grains. Drink enough fluids to keep your urine clear or pale yellow. Activity and Exercise Exercise only as directed by your health care provider. Exercising will help you: Control your weight. Stay in shape. Be prepared for labor and delivery. Experiencing pain or cramping in the lower abdomen or low back is a good sign that you should stop exercising. Check with your health care provider before continuing normal exercises. Try to avoid standing for long periods of time. Move your legs often if you must stand in one place for a long time. Avoid heavy lifting. Wear low-heeled shoes, and practice good posture. You may  continue to have sex unless your health care provider directs you otherwise. Relief of Pain or Discomfort Wear a good support bra for breast tenderness.   Take warm sitz baths to soothe any pain or discomfort caused by hemorrhoids. Use hemorrhoid cream if your health care provider approves.   Rest with your legs elevated if you have leg cramps or low back pain. If you develop varicose veins in your legs, wear support hose. Elevate your feet for 15 minutes, 3-4 times a day. Limit salt in your diet. Prenatal Care Schedule your prenatal visits by the  twelfth week of pregnancy. They are usually scheduled monthly at first, then more often in the last 2 months before delivery. Write down your questions. Take them to your prenatal visits. Keep all your prenatal visits as directed by your health care provider. Safety Wear your seat belt at all times when driving. Make a list of emergency phone numbers, including numbers for family, friends, the hospital, and police and fire departments. General Tips Ask your health care provider for a referral to a local prenatal education class. Begin classes no later than at the beginning of month 6 of your pregnancy. Ask for help if you have counseling or nutritional needs during pregnancy. Your health care provider can offer advice or refer you to specialists for help with various needs. Do not use hot tubs, steam rooms, or saunas. Do not douche or use tampons or scented sanitary pads. Do not cross your legs for long periods of time. Avoid cat litter boxes and soil used by cats. These carry germs that can cause birth defects in the baby and possibly loss of the fetus by miscarriage or stillbirth. Avoid all smoking, herbs, alcohol, and medicines not prescribed by your health care provider. Chemicals in these affect the formation and growth of the baby. Schedule a dentist appointment. At home, brush your teeth with a soft toothbrush and be gentle when you floss. SEEK  MEDICAL CARE IF:  You have dizziness. You have mild pelvic cramps, pelvic pressure, or nagging pain in the abdominal area. You have persistent nausea, vomiting, or diarrhea. You have a bad smelling vaginal discharge. You have pain with urination. You notice increased swelling in your face, hands, legs, or ankles. SEEK IMMEDIATE MEDICAL CARE IF:  You have a fever. You are leaking fluid from your vagina. You have spotting or bleeding from your vagina. You have severe abdominal cramping or pain. You have rapid weight gain or loss. You vomit blood or material that looks like coffee grounds. You are exposed to [place] measles and have never had them. You are exposed to fifth disease or chickenpox. You develop a severe headache. You have shortness of breath. You have any kind of trauma, such as from a fall or a car accident. Document Released: 11/18/2001 Document Revised: 04/10/2014 Document Reviewed: 10/04/2013 [facility] Patient Information 2015 [place], [place]. This information is not intended to replace advice given to you by your health care provider. Make sure you discuss any questions you have with your health care provider.

## 2024-03-09 ENCOUNTER — Encounter: Payer: Self-pay | Admitting: Women's Health

## 2024-03-09 LAB — INTEGRATED 1

## 2024-03-09 MED ORDER — FERROUS SULFATE 325 (65 FE) MG PO TABS: 325.0000 mg | ORAL_TABLET | ORAL | 2 refills | Status: DC

## 2024-03-09 NOTE — Addendum Note (Signed)
 Addended by: Cheral Marker on: 03/09/2024 08:37 AM   Modules accepted: Orders

## 2024-03-10 LAB — CBC/D/PLT+RPR+RH+ABO+RUBIGG...
Antibody Screen: NEGATIVE
Basophils Absolute: 0 10*3/uL (ref 0.0–0.2)
Basos: 0 %
EOS (ABSOLUTE): 0 10*3/uL (ref 0.0–0.4)
Eos: 0 %
HCV Ab: NONREACTIVE
HIV Screen 4th Generation wRfx: NONREACTIVE
Hematocrit: 33.9 % — ABNORMAL LOW (ref 34.0–46.6)
Hemoglobin: 10.8 g/dL — ABNORMAL LOW (ref 11.1–15.9)
Hepatitis B Surface Ag: NEGATIVE
Immature Grans (Abs): 0 10*3/uL (ref 0.0–0.1)
Immature Granulocytes: 0 %
Lymphocytes Absolute: 1.5 10*3/uL (ref 0.7–3.1)
Lymphs: 21 %
MCH: 25.2 pg — ABNORMAL LOW (ref 26.6–33.0)
MCHC: 31.9 g/dL (ref 31.5–35.7)
MCV: 79 fL (ref 79–97)
Monocytes Absolute: 0.3 10*3/uL (ref 0.1–0.9)
Monocytes: 4 %
Neutrophils Absolute: 5.3 10*3/uL (ref 1.4–7.0)
Neutrophils: 75 %
Platelets: 290 10*3/uL (ref 150–450)
RBC: 4.29 x10E6/uL (ref 3.77–5.28)
RDW: 15.1 % (ref 11.7–15.4)
RPR Ser Ql: NONREACTIVE
Rh Factor: POSITIVE
Rubella Antibodies, IGG: 1.03 {index} (ref >0.99)
WBC: 7.2 10*3/uL (ref 3.4–10.8)

## 2024-03-10 LAB — HEMOGLOBIN A1C
Est. average glucose Bld gHb Est-mCnc: 108 mg/dL
Hgb A1c MFr Bld: 5.4 % (ref 4.8–5.6)

## 2024-03-10 LAB — INTEGRATED 1
Crown Rump Length: 69.6 mm
Gest. Age on Collection Date: 13 wk
PAPP-A Value: 1732.7 ng/mL
Race: 1
Sonographer ID#: 27.7 a
Sonographer ID#: 309760
Weight: 1.9 mm
Weight: 195 [lb_av]

## 2024-03-10 LAB — GC/CHLAMYDIA PROBE AMP
Chlamydia trachomatis, NAA: NEGATIVE
Neisseria Gonorrhoeae by PCR: NEGATIVE

## 2024-03-10 LAB — HCV INTERPRETATION

## 2024-03-11 LAB — URINE CULTURE

## 2024-03-13 LAB — PANORAMA PRENATAL TEST FULL PANEL:PANORAMA TEST PLUS 5 ADDITIONAL MICRODELETIONS: FETAL FRACTION: 10.3

## 2024-03-15 ENCOUNTER — Encounter: Payer: Self-pay | Admitting: Women's Health

## 2024-04-05 ENCOUNTER — Encounter: Payer: Self-pay | Admitting: Women's Health

## 2024-04-05 ENCOUNTER — Ambulatory Visit (INDEPENDENT_AMBULATORY_CARE_PROVIDER_SITE_OTHER): Admitting: Women's Health

## 2024-04-05 VITALS — BP 119/62 | HR 86 | Wt 197.0 lb

## 2024-04-05 DIAGNOSIS — Z3A16 16 weeks gestation of pregnancy: Secondary | ICD-10-CM

## 2024-04-05 DIAGNOSIS — Z3482 Encounter for supervision of other normal pregnancy, second trimester: Secondary | ICD-10-CM | POA: Diagnosis not present

## 2024-04-05 DIAGNOSIS — Z363 Encounter for antenatal screening for malformations: Secondary | ICD-10-CM | POA: Diagnosis not present

## 2024-04-05 DIAGNOSIS — Z3480 Encounter for supervision of other normal pregnancy, unspecified trimester: Secondary | ICD-10-CM

## 2024-04-05 NOTE — Patient Instructions (Signed)
[place], thank you for choosing our office today! We appreciate the opportunity to meet your healthcare needs. You may receive a short survey by mail, e-mail, or through MyChart. If you are happy with your care we would appreciate if you could take just a few minutes to complete the survey questions. We read all of your comments and take your feedback very seriously. Thank you again for choosing our office.  [facility] Team at [facility] ([address] [place], [place] [address]) Entrance C, located off of [address] Free 24/7 valet parking  Go to [email or web address] to register for FREE online childbirth classes  Call the office ([phone]) or go to [facility] if: You begin to severe cramping Your water breaks.  Sometimes it is a big gush of fluid, sometimes it is just a trickle that keeps getting your panties wet or running down your legs You have vaginal bleeding.  It is normal to have a small amount of spotting if your cervix was checked.   [place] Pediatric[facility]/Family Doctors [facility] ([facility]): [address], [phone]           [facility]: [address], [phone]                [facility] ([facility]): [address], [phone] (call to ask if accepting patients) [facility]: [address], [place], [phone]    [place] [facility]/Family Doctors [facility] ([facility]): [address], [phone] [facility]: [address], [phone] [facility]: [address], [phone]  [place] [facility]  [facility] ([facility]): [phone] [facility]: [address], [phone]   [place] [facility]: [address], [phone]  [place] [facility]  [facility]: [address], [phone]  Home Blood Pressure Monitoring for Patients   Your provider has recommended that you check your blood pressure (BP) at least once a week at home. If you do not have a blood pressure cuff at home, one will be provided for you. Contact your provider if you have not received your monitor within 1 week.   Helpful Tips for Accurate Home Blood Pressure Checks  Don't smoke, exercise, or drink caffeine 30 minutes before checking your BP Use the restroom before checking your BP (a full bladder can raise your pressure) Relax in a comfortable upright chair Feet on the ground Left arm resting comfortably on a flat surface at the level of your heart Legs uncrossed Back supported Sit quietly and don't talk Place the cuff on your bare arm Adjust snuggly, so that only two fingertips can fit between your skin and the top of the cuff Check 2 readings separated by at least one minute Keep a log of your BP readings For a visual, please reference this diagram: http://ccnc.care/bpdiagram  Provider Name: [facility]     Phone: [phone]  Zone 1: ALL CLEAR  Continue to monitor your symptoms:  BP reading is less than 140 (top number) or less than 90 (bottom number)  No right upper stomach pain No headaches or seeing spots No feeling nauseated or throwing up No swelling in face and hands  Zone 2: CAUTION Call your doctor's office for any of the following:  BP reading is greater than 140 (top number) or greater than   90 (bottom number)  Stomach pain under your ribs in the middle or right side Headaches or seeing spots Feeling nauseated or throwing up Swelling in face and hands  Zone 3: EMERGENCY  Seek immediate medical care if you have any of the following:  BP reading is greater than160 (top number) or greater than 110 (bottom number) Severe headaches not improving with Tylenol Serious difficulty catching your breath Any worsening symptoms from  Zone 2     Second Trimester of Pregnancy The second trimester is from week 14 through week 27 (months 4 through 6). The second trimester is often a time when you feel your best. Your body has adjusted to being pregnant, and you begin to feel better physically. Usually, morning sickness has lessened or quit completely, you may have more energy, and you may have an increase in appetite. The second trimester is also a time when the fetus is growing rapidly. At the end of the sixth month, the fetus is about 9 inches long and weighs about 1 pounds. You will likely begin to feel the baby move (quickening) between 16 and 20 weeks of pregnancy. Body changes during your second trimester Your body continues to go through many changes during your second trimester. The changes vary from woman to woman. Your weight will continue to increase. You will notice your lower abdomen bulging out. You may begin to get stretch marks on your hips, abdomen, and breasts. You may develop headaches that can be relieved by medicines. The medicines should be approved by your health care provider. You may urinate more often because the fetus is pressing on your bladder. You may develop or continue to have heartburn as a result of your pregnancy. You may develop constipation because certain hormones are causing the muscles that push waste through your intestines to slow down. You may develop hemorrhoids or swollen, bulging veins (varicose veins). You may have back pain. This is caused by: Weight gain. Pregnancy hormones that are relaxing the joints in your pelvis. A shift in weight and the muscles that support your balance. Your breasts will continue to grow and they will continue to become tender. Your gums may bleed and may be sensitive to brushing and flossing. Dark spots or blotches (chloasma, mask of pregnancy) may develop on your face. This will likely fade after the baby is born. A dark line from your belly button to  the pubic area (linea nigra) may appear. This will likely fade after the baby is born. You may have changes in your hair. These can include thickening of your hair, rapid growth, and changes in texture. Some women also have hair loss during or after pregnancy, or hair that feels dry or thin. Your hair will most likely return to normal after your baby is born.  What to expect at prenatal visits During a routine prenatal visit: You will be weighed to make sure you and the fetus are growing normally. Your blood pressure will be taken. Your abdomen will be measured to track your baby's growth. The fetal heartbeat will be listened to. Any test results from the previous visit will be discussed.  Your health care provider may ask you: How you are feeling. If you are feeling the baby move. If you have had any abnormal symptoms, such as leaking fluid, bleeding, severe headaches, or abdominal cramping. If you are using any tobacco products, including cigarettes, chewing tobacco, and electronic cigarettes. If you have any questions.  Other tests that may be performed during   your second trimester include: Blood tests that check for: Low iron levels (anemia). High blood sugar that affects pregnant women (gestational diabetes) between 24 and 28 weeks. Rh antibodies. This is to check for a protein on red blood cells (Rh factor). Urine tests to check for infections, diabetes, or protein in the urine. An ultrasound to confirm the proper growth and development of the baby. An amniocentesis to check for possible genetic problems. Fetal screens for spina bifida and Down syndrome. HIV (human immunodeficiency virus) testing. Routine prenatal testing includes screening for HIV, unless you choose not to have this test.  Follow these instructions at home: Medicines Follow your health care provider's instructions regarding medicine use. Specific medicines may be either safe or unsafe to take during  pregnancy. Take a prenatal vitamin that contains at least 600 micrograms (mcg) of folic acid. If you develop constipation, try taking a stool softener if your health care provider approves. Eating and drinking Eat a balanced diet that includes fresh fruits and vegetables, whole grains, good sources of protein such as meat, eggs, or tofu, and low-fat dairy. Your health care provider will help you determine the amount of weight gain that is right for you. Avoid raw meat and uncooked cheese. These carry germs that can cause birth defects in the baby. If you have low calcium intake from food, talk to your health care provider about whether you should take a daily calcium supplement. Limit foods that are high in fat and processed sugars, such as fried and sweet foods. To prevent constipation: Drink enough fluid to keep your urine clear or pale yellow. Eat foods that are high in fiber, such as fresh fruits and vegetables, whole grains, and beans. Activity Exercise only as directed by your health care provider. Most women can continue their usual exercise routine during pregnancy. Try to exercise for 30 minutes at least 5 days a week. Stop exercising if you experience uterine contractions. Avoid heavy lifting, wear low heel shoes, and practice good posture. A sexual relationship may be continued unless your health care provider directs you otherwise. Relieving pain and discomfort Wear a good support bra to prevent discomfort from breast tenderness. Take warm sitz baths to soothe any pain or discomfort caused by hemorrhoids. Use hemorrhoid cream if your health care provider approves. Rest with your legs elevated if you have leg cramps or low back pain. If you develop varicose veins, wear support hose. Elevate your feet for 15 minutes, 3-4 times a day. Limit salt in your diet. Prenatal Care Write down your questions. Take them to your prenatal visits. Keep all your prenatal visits as told by your health  care provider. This is important. Safety Wear your seat belt at all times when driving. Make a list of emergency phone numbers, including numbers for family, friends, the hospital, and police and fire departments. General instructions Ask your health care provider for a referral to a local prenatal education class. Begin classes no later than the beginning of month 6 of your pregnancy. Ask for help if you have counseling or nutritional needs during pregnancy. Your health care provider can offer advice or refer you to specialists for help with various needs. Do not use hot tubs, steam rooms, or saunas. Do not douche or use tampons or scented sanitary pads. Do not cross your legs for long periods of time. Avoid cat litter boxes and soil used by cats. These carry germs that can cause birth defects in the baby and possibly loss of the   fetus by miscarriage or stillbirth. Avoid all smoking, herbs, alcohol, and unprescribed drugs. Chemicals in these products can affect the formation and growth of the baby. Do not use any products that contain nicotine or tobacco, such as cigarettes and e-cigarettes. If you need help quitting, ask your health care provider. Visit your dentist if you have not gone yet during your pregnancy. Use a soft toothbrush to brush your teeth and be gentle when you floss. Contact a health care provider if: You have dizziness. You have mild pelvic cramps, pelvic pressure, or nagging pain in the abdominal area. You have persistent nausea, vomiting, or diarrhea. You have a bad smelling vaginal discharge. You have pain when you urinate. Get help right away if: You have a fever. You are leaking fluid from your vagina. You have spotting or bleeding from your vagina. You have severe abdominal cramping or pain. You have rapid weight gain or weight loss. You have shortness of breath with chest pain. You notice sudden or extreme swelling of your face, hands, ankles, feet, or legs. You  have not felt your baby move in over an hour. You have severe headaches that do not go away when you take medicine. You have vision changes. Summary The second trimester is from week 14 through week 27 (months 4 through 6). It is also a time when the fetus is growing rapidly. Your body goes through many changes during pregnancy. The changes vary from woman to woman. Avoid all smoking, herbs, alcohol, and unprescribed drugs. These chemicals affect the formation and growth your baby. Do not use any tobacco products, such as cigarettes, chewing tobacco, and e-cigarettes. If you need help quitting, ask your health care provider. Contact your health care provider if you have any questions. Keep all prenatal visits as told by your health care provider. This is important. This information is not intended to replace advice given to you by your health care provider. Make sure you discuss any questions you have with your health care provider. Document Released: 11/18/2001 Document Revised: 05/01/2016 Document Reviewed: 01/25/2013 Elsevier Interactive Patient Education  2017 [employer] Inc.  

## 2024-04-05 NOTE — Progress Notes (Signed)
 LOW-RISK PREGNANCY VISIT Patient name: THORA ASLAM MRN [id number]  Date of birth: July 16, 1996 Chief Complaint:   Routine Prenatal Visit  History of Present Illness:   Bailey Palmer is a 28 y.o. G72P3003 female at [redacted]w[redacted]d with an Estimated Date of Delivery: 09/17/24 being seen today for ongoing management of a low-risk pregnancy.   Today she reports no complaints. Contractions: Not present. Vag. Bleeding: None.   . denies leaking of fluid.     03/08/2024    8:36 AM 05/18/2023    9:06 AM 02/03/2023    2:26 PM 04/17/2021    8:48 AM 02/16/2020   11:26 AM  Depression screen PHQ 2/9  Decreased Interest 0 0 0 0 0  Down, Depressed, Hopeless 0 0 0 0 0  PHQ - 2 Score 0 0 0 0 0  Altered sleeping 0 1 1 1    Tired, decreased energy 1  0 0   Change in appetite 2 0 0 0   Feeling bad or failure about yourself  0 0 0 0   Trouble concentrating 0 0 0 0   Moving slowly or fidgety/restless 0 0 0 0   Suicidal thoughts 0 0 0 0   PHQ-9 Score 3 1 1 1    Difficult doing work/chores  Not difficult at all           03/08/2024    8:36 AM 02/03/2023    2:26 PM 04/17/2021    8:48 AM  GAD 7 : Generalized Anxiety Score  Nervous, Anxious, on Edge 0 0 0  Control/stop worrying 0 0 0  Worry too much - different things 0 0 0  Trouble relaxing 0 0 0  Restless 0 0 0  Easily annoyed or irritable 0 0 0  Afraid - awful might happen 0 0 0  Total GAD 7 Score 0 0 0      Review of Systems:   Pertinent items are noted in HPI Denies abnormal vaginal discharge w/ itching/odor/irritation, headaches, visual changes, shortness of breath, chest pain, abdominal pain, severe nausea/vomiting, or problems with urination or bowel movements unless otherwise stated above. Pertinent History Reviewed:  Reviewed past medical,surgical, social, obstetrical and family history.  Reviewed problem list, medications and allergies. Physical Assessment:   Vitals:   04/05/24 1011  BP: 119/62  Pulse: 86  Weight: 197 lb (89.4 kg)   Body mass index is 34.9 kg/m.        Physical Examination:   General appearance: Well appearing, and in no distress  Mental status: Alert, oriented to person, place, and time  Skin: Warm & dry  Cardiovascular: Normal heart rate noted  Respiratory: Normal respiratory effort, no distress  Abdomen: Soft, gravid, nontender  Pelvic: Cervical exam deferred         Extremities:    Fetal Status: Fetal Heart Rate (bpm): 140        Chaperone: N/A No results found for this or any previous visit (from the past 24 hours).  Assessment & Plan:  1) Low-risk pregnancy G4P3003 at [redacted]w[redacted]d with an Estimated Date of Delivery: 09/17/24    Meds: No orders of the defined types were placed in this encounter.  Labs/procedures today: 2nd IT  Plan:  Continue routine obstetrical care  Next visit: prefers will be in person for u/s     Reviewed: Preterm labor symptoms and general obstetric precautions including but not limited to vaginal bleeding, contractions, leaking of fluid and fetal movement were reviewed in detail with the  patient.  All questions were answered. Does have home bp cuff. Office bp cuff given: not applicable. Check bp weekly, let us  know if consistently >140 and/or >90.  Follow-up: Return for As scheduled-pap w/ next visit.  Future Appointments  Date Time Provider Department Center  05/03/2024  8:30 AM [facility] 2 [facility] None  05/03/2024  9:50 AM Ferd Householder, CNM [facility]    Orders Placed This Encounter  Procedures   US  OB Comp + 14 Wk   INTEGRATED 2   Ferd Householder CNM, [facility] 04/05/2024 10:47 AM

## 2024-04-07 ENCOUNTER — Encounter: Payer: Self-pay | Admitting: Women's Health

## 2024-04-07 LAB — INTEGRATED 2
AFP MoM: 1.05
Alpha-Fetoprotein: 31 ng/mL
Crown Rump Length: 69.6 mm
DIA MoM: 0.76
DIA Value: 102.1 pg/mL
Estriol, Unconjugated: 1.1 ng/mL
Gest. Age on Collection Date: 13 wk
Gestational Age: 17 wk
Maternal Age at EDD: 27.7 a
Nuchal Translucency (NT): 1.9 mm
Nuchal Translucency MoM: 1.01
Number of Fetuses: 1
PAPP-A MoM: 2.47
PAPP-A Value: 1732.7 ng/mL
Sonographer ID#: 309760
Test Results:: NEGATIVE
Weight: 195 [lb_av]
Weight: 195 [lb_av]
hCG MoM: 0.75
hCG Value: 19.2 [IU]/mL
uE3 MoM: 0.93

## 2024-04-14 ENCOUNTER — Ambulatory Visit: Payer: Self-pay

## 2024-05-03 ENCOUNTER — Other Ambulatory Visit (HOSPITAL_COMMUNITY)
Admission: RE | Admit: 2024-05-03 | Discharge: 2024-05-03 | Disposition: A | Discharge status: Home / Self Care | Source: Ambulatory Visit | Attending: Women's Health | Admitting: Women's Health

## 2024-05-03 ENCOUNTER — Ambulatory Visit: Admitting: Radiology

## 2024-05-03 ENCOUNTER — Encounter: Payer: Self-pay | Admitting: Women's Health

## 2024-05-03 ENCOUNTER — Ambulatory Visit: Admitting: Women's Health

## 2024-05-03 VITALS — BP 104/57 | HR 91 | Wt 199.2 lb

## 2024-05-03 DIAGNOSIS — Z124 Encounter for screening for malignant neoplasm of cervix: Secondary | ICD-10-CM | POA: Insufficient documentation

## 2024-05-03 DIAGNOSIS — Z363 Encounter for antenatal screening for malformations: Secondary | ICD-10-CM | POA: Diagnosis not present

## 2024-05-03 DIAGNOSIS — Z3A20 20 weeks gestation of pregnancy: Secondary | ICD-10-CM

## 2024-05-03 DIAGNOSIS — Z3482 Encounter for supervision of other normal pregnancy, second trimester: Secondary | ICD-10-CM

## 2024-05-03 DIAGNOSIS — Z3480 Encounter for supervision of other normal pregnancy, unspecified trimester: Secondary | ICD-10-CM

## 2024-05-03 NOTE — Patient Instructions (Signed)
Bailey Palmer, thank you for choosing our office today! We appreciate the opportunity to meet your healthcare needs. You may receive a short survey by mail, e-mail, or through MyChart. If you are happy with your care we would appreciate if you could take just a few minutes to complete the survey questions. We read all of your comments and take your feedback very seriously. Thank you again for choosing our office.  [facility] Team at [facility] ([address] [place], [place] [address]) Entrance C, located off of [address] Free 24/7 valet parking  Go to [email or web address] to register for FREE online childbirth classes  Call the office ([phone]) or go to [facility] if: You begin to severe cramping Your water breaks.  Sometimes it is a big gush of fluid, sometimes it is just a trickle that keeps getting your panties wet or running down your legs You have vaginal bleeding.  It is normal to have a small amount of spotting if your cervix was checked.   [place] [facility] ([facility]): [address], [phone]           [facility]: [address], [phone]                [facility] ([facility]): [address], [phone] (call to ask if accepting patients) [facility]: [address], [place], [phone]    [place] [facility]/Family Doctors [facility] ([facility]): [address], [phone] [facility]: [address], [phone] [facility]: [address], [phone]  [place] [facility]  [facility] ([facility]): [phone] [facility]: [address], [phone]   [place] [facility]: [address], [phone]  [place] [facility]  [facility]: [address], [phone]  Home Blood Pressure Monitoring for Patients   Your provider has recommended that you check your blood pressure (BP) at least once a week at home. If you do not have a blood pressure cuff at home, one will be provided for you. Contact your provider if you have not received your monitor within 1 week.   Helpful Tips for Accurate Home Blood Pressure Checks  Don't smoke, exercise, or drink caffeine 30 minutes before checking your BP Use the restroom before checking your BP (a full bladder can raise your pressure) Relax in a comfortable upright chair Feet on the ground Left arm resting comfortably on a flat surface at the level of your heart Legs uncrossed Back supported Sit quietly and don't talk Place the cuff on your bare arm Adjust snuggly, so that only two fingertips can fit between your skin and the top of the cuff Check 2 readings separated by at least one minute Keep a log of your BP readings For a visual, please reference this diagram: http://ccnc.care/bpdiagram  Provider Name: [facility]     Phone: [phone]  Zone 1: ALL CLEAR  Continue to monitor your symptoms:  BP reading is less than 140 (top number) or less than 90 (bottom number)  No right upper stomach pain No headaches or seeing spots No feeling nauseated or throwing up No swelling in face and hands  Zone 2: CAUTION Call your doctor's office for any of the following:  BP reading is greater than 140 (top number) or greater than   90 (bottom number)  Stomach pain under your ribs in the middle or right side Headaches or seeing spots Feeling nauseated or throwing up Swelling in face and hands  Zone 3: EMERGENCY  Seek immediate medical care if you have any of the following:  BP reading is greater than160 (top number) or greater than 110 (bottom number) Severe headaches not improving with Tylenol Serious difficulty catching your breath Any worsening symptoms from  Zone 2     Second Trimester of Pregnancy The second trimester is from week 14 through week 27 (months 4 through 6). The second trimester is often a time when you feel your best. Your body has adjusted to being pregnant, and you begin to feel better physically. Usually, morning sickness has lessened or quit completely, you may have more energy, and you may have an increase in appetite. The second trimester is also a time when the fetus is growing rapidly. At the end of the sixth month, the fetus is about 9 inches long and weighs about 1 pounds. You will likely begin to feel the baby move (quickening) between 16 and 20 weeks of pregnancy. Body changes during your second trimester Your body continues to go through many changes during your second trimester. The changes vary from woman to woman. Your weight will continue to increase. You will notice your lower abdomen bulging out. You may begin to get stretch marks on your hips, abdomen, and breasts. You may develop headaches that can be relieved by medicines. The medicines should be approved by your health care provider. You may urinate more often because the fetus is pressing on your bladder. You may develop or continue to have heartburn as a result of your pregnancy. You may develop constipation because certain hormones are causing the muscles that push waste through your intestines to slow down. You may develop hemorrhoids or swollen, bulging veins (varicose veins). You may have back pain. This is caused by: Weight gain. Pregnancy hormones that are relaxing the joints in your pelvis. A shift in weight and the muscles that support your balance. Your breasts will continue to grow and they will continue to become tender. Your gums may bleed and may be sensitive to brushing and flossing. Dark spots or blotches (chloasma, mask of pregnancy) may develop on your face. This will likely fade after the baby is born. A dark line from your belly button to  the pubic area (linea nigra) may appear. This will likely fade after the baby is born. You may have changes in your hair. These can include thickening of your hair, rapid growth, and changes in texture. Some women also have hair loss during or after pregnancy, or hair that feels dry or thin. Your hair will most likely return to normal after your baby is born.  What to expect at prenatal visits During a routine prenatal visit: You will be weighed to make sure you and the fetus are growing normally. Your blood pressure will be taken. Your abdomen will be measured to track your baby's growth. The fetal heartbeat will be listened to. Any test results from the previous visit will be discussed.  Your health care provider may ask you: How you are feeling. If you are feeling the baby move. If you have had any abnormal symptoms, such as leaking fluid, bleeding, severe headaches, or abdominal cramping. If you are using any tobacco products, including cigarettes, chewing tobacco, and electronic cigarettes. If you have any questions.  Other tests that may be performed during   your second trimester include: Blood tests that check for: Low iron levels (anemia). High blood sugar that affects pregnant women (gestational diabetes) between 24 and 28 weeks. Rh antibodies. This is to check for a protein on red blood cells (Rh factor). Urine tests to check for infections, diabetes, or protein in the urine. An ultrasound to confirm the proper growth and development of the baby. An amniocentesis to check for possible genetic problems. Fetal screens for spina bifida and Down syndrome. HIV (human immunodeficiency virus) testing. Routine prenatal testing includes screening for HIV, unless you choose not to have this test.  Follow these instructions at home: Medicines Follow your health care provider's instructions regarding medicine use. Specific medicines may be either safe or unsafe to take during  pregnancy. Take a prenatal vitamin that contains at least 600 micrograms (mcg) of folic acid. If you develop constipation, try taking a stool softener if your health care provider approves. Eating and drinking Eat a balanced diet that includes fresh fruits and vegetables, whole grains, good sources of protein such as meat, eggs, or tofu, and low-fat dairy. Your health care provider will help you determine the amount of weight gain that is right for you. Avoid raw meat and uncooked cheese. These carry germs that can cause birth defects in the baby. If you have low calcium intake from food, talk to your health care provider about whether you should take a daily calcium supplement. Limit foods that are high in fat and processed sugars, such as fried and sweet foods. To prevent constipation: Drink enough fluid to keep your urine clear or pale yellow. Eat foods that are high in fiber, such as fresh fruits and vegetables, whole grains, and beans. Activity Exercise only as directed by your health care provider. Most women can continue their usual exercise routine during pregnancy. Try to exercise for 30 minutes at least 5 days a week. Stop exercising if you experience uterine contractions. Avoid heavy lifting, wear low heel shoes, and practice good posture. A sexual relationship may be continued unless your health care provider directs you otherwise. Relieving pain and discomfort Wear a good support bra to prevent discomfort from breast tenderness. Take warm sitz baths to soothe any pain or discomfort caused by hemorrhoids. Use hemorrhoid cream if your health care provider approves. Rest with your legs elevated if you have leg cramps or low back pain. If you develop varicose veins, wear support hose. Elevate your feet for 15 minutes, 3-4 times a day. Limit salt in your diet. Prenatal Care Write down your questions. Take them to your prenatal visits. Keep all your prenatal visits as told by your health  care provider. This is important. Safety Wear your seat belt at all times when driving. Make a list of emergency phone numbers, including numbers for family, friends, the hospital, and police and fire departments. General instructions Ask your health care provider for a referral to a local prenatal education class. Begin classes no later than the beginning of month 6 of your pregnancy. Ask for help if you have counseling or nutritional needs during pregnancy. Your health care provider can offer advice or refer you to specialists for help with various needs. Do not use hot tubs, steam rooms, or saunas. Do not douche or use tampons or scented sanitary pads. Do not cross your legs for long periods of time. Avoid cat litter boxes and soil used by cats. These carry germs that can cause birth defects in the baby and possibly loss of the   fetus by miscarriage or stillbirth. Avoid all smoking, herbs, alcohol, and unprescribed drugs. Chemicals in these products can affect the formation and growth of the baby. Do not use any products that contain nicotine or tobacco, such as cigarettes and e-cigarettes. If you need help quitting, ask your health care provider. Visit your dentist if you have not gone yet during your pregnancy. Use a soft toothbrush to brush your teeth and be gentle when you floss. Contact a health care provider if: You have dizziness. You have mild pelvic cramps, pelvic pressure, or nagging pain in the abdominal area. You have persistent nausea, vomiting, or diarrhea. You have a bad smelling vaginal discharge. You have pain when you urinate. Get help right away if: You have a fever. You are leaking fluid from your vagina. You have spotting or bleeding from your vagina. You have severe abdominal cramping or pain. You have rapid weight gain or weight loss. You have shortness of breath with chest pain. You notice sudden or extreme swelling of your face, hands, ankles, feet, or legs. You  have not felt your baby move in over an hour. You have severe headaches that do not go away when you take medicine. You have vision changes. Summary The second trimester is from week 14 through week 27 (months 4 through 6). It is also a time when the fetus is growing rapidly. Your body goes through many changes during pregnancy. The changes vary from woman to woman. Avoid all smoking, herbs, alcohol, and unprescribed drugs. These chemicals affect the formation and growth your baby. Do not use any tobacco products, such as cigarettes, chewing tobacco, and e-cigarettes. If you need help quitting, ask your health care provider. Contact your health care provider if you have any questions. Keep all prenatal visits as told by your health care provider. This is important. This information is not intended to replace advice given to you by your health care provider. Make sure you discuss any questions you have with your health care provider. Document Released: 11/18/2001 Document Revised: 05/01/2016 Document Reviewed: 01/25/2013 Elsevier Interactive Patient Education  2017 [employer]  

## 2024-05-03 NOTE — Progress Notes (Signed)
 LOW-RISK PREGNANCY VISIT Patient name: Bailey Palmer MRN [id number]  Date of birth: December 26, 1995 Chief Complaint:   Routine Prenatal Visit  History of Present Illness:   Bailey Palmer is a 28 y.o. G71P3003 female at [redacted]w[redacted]d with an Estimated Date of Delivery: 09/17/24 being seen today for ongoing management of a low-risk pregnancy.   Today she reports sometimes has pain in vagina, had vulvar varicose veins last pregnancy- kind of feels like that. Contractions: Not present. Vag. Bleeding: None.  Movement: Present. denies leaking of fluid.     03/08/2024    8:36 AM 05/18/2023    9:06 AM 02/03/2023    2:26 PM 04/17/2021    8:48 AM 02/16/2020   11:26 AM  Depression screen PHQ 2/9  Decreased Interest 0 0 0 0 0  Down, Depressed, Hopeless 0 0 0 0 0  PHQ - 2 Score 0 0 0 0 0  Altered sleeping 0 1 1 1    Tired, decreased energy 1  0 0   Change in appetite 2 0 0 0   Feeling bad or failure about yourself  0 0 0 0   Trouble concentrating 0 0 0 0   Moving slowly or fidgety/restless 0 0 0 0   Suicidal thoughts 0 0 0 0   PHQ-9 Score 3 1 1 1    Difficult doing work/chores  Not difficult at all           03/08/2024    8:36 AM 02/03/2023    2:26 PM 04/17/2021    8:48 AM  GAD 7 : Generalized Anxiety Score  Nervous, Anxious, on Edge 0 0 0  Control/stop worrying 0 0 0  Worry too much - different things 0 0 0  Trouble relaxing 0 0 0  Restless 0 0 0  Easily annoyed or irritable 0 0 0  Afraid - awful might happen 0 0 0  Total GAD 7 Score 0 0 0      Review of Systems:   Pertinent items are noted in HPI Denies abnormal vaginal discharge w/ itching/odor/irritation, headaches, visual changes, shortness of breath, chest pain, abdominal pain, severe nausea/vomiting, or problems with urination or bowel movements unless otherwise stated above. Pertinent History Reviewed:  Reviewed past medical,surgical, social, obstetrical and family history.  Reviewed problem list, medications and allergies. Physical  Assessment:   Vitals:   05/03/24 0911  BP: (!) 104/57  Pulse: 91  Weight: 199 lb 3.2 oz (90.4 kg)  Body mass index is 35.29 kg/m.        Physical Examination:   General appearance: Well appearing, and in no distress  Mental status: Alert, oriented to person, place, and time  Skin: Warm & dry  Cardiovascular: Normal heart rate noted  Respiratory: Normal respiratory effort, no distress  Abdomen: Soft, gravid, nontender  Pelvic: thin prep pap obtained, no obvious varicose veins         Extremities:    Fetal Status:     Movement: Present  US : GA = 20+3 weeks Single active female fetus, cephalic, FHR = 130 bpm,  posterior pl, gr1, MVP = 5.7 cm,  EFW57%, 369g, nl ov's, CL = 5.5 cm,  Anatomy screen completed - no apparent abnormalities    Chaperone: Lorean Rodes No results found for this or any previous visit (from the past 24 hours).  Assessment & Plan:  1) Low-risk pregnancy G4P3003 at [redacted]w[redacted]d with an Estimated Date of Delivery: 09/17/24   2) Cervical cancer screen, pap today  Meds: No orders of the defined types were placed in this encounter.  Labs/procedures today: pap and U/S  Plan:  Continue routine obstetrical care  Next visit: prefers in person    Reviewed: Preterm labor symptoms and general obstetric precautions including but not limited to vaginal bleeding, contractions, leaking of fluid and fetal movement were reviewed in detail with the patient.  All questions were answered. Does have home bp cuff. Office bp cuff given: not applicable. Check bp weekly, let us  know if consistently >140 and/or >90.  Follow-up: Return in about 4 weeks (around 05/31/2024) for LROB, CNM, in person.  Future Appointments  Date Time Provider Department Center  05/03/2024  9:50 AM Ferd Householder, CNM [facility]  05/31/2024  8:50 AM Ferd Householder, CNM [facility]    No orders of the defined types were placed in this encounter.  Ferd Householder CNM, [facility] 05/03/2024 9:36  AM

## 2024-05-03 NOTE — Progress Notes (Signed)
 US : GA = 20+3 weeks Single active female fetus, cephalic, FHR = 130 bpm,  posterior pl, gr1, MVP = 5.7 cm,  EFW57%, 369g, nl ov's, CL = 5.5 cm,  Anatomy screen completed - no apparent abnormalities

## 2024-05-05 ENCOUNTER — Ambulatory Visit: Payer: Self-pay | Admitting: Women's Health

## 2024-05-05 DIAGNOSIS — R87619 Unspecified abnormal cytological findings in specimens from cervix uteri: Secondary | ICD-10-CM | POA: Insufficient documentation

## 2024-05-05 LAB — CYTOLOGY - PAP
Comment: NEGATIVE
High risk HPV: NEGATIVE

## 2024-05-12 ENCOUNTER — Encounter: Payer: Self-pay | Admitting: Women's Health

## 2024-05-31 ENCOUNTER — Ambulatory Visit (INDEPENDENT_AMBULATORY_CARE_PROVIDER_SITE_OTHER): Admitting: Women's Health

## 2024-05-31 ENCOUNTER — Encounter: Payer: Self-pay | Admitting: Women's Health

## 2024-05-31 VITALS — BP 116/58 | HR 98 | Wt 202.4 lb

## 2024-05-31 DIAGNOSIS — Z3482 Encounter for supervision of other normal pregnancy, second trimester: Secondary | ICD-10-CM

## 2024-05-31 DIAGNOSIS — Z3A24 24 weeks gestation of pregnancy: Secondary | ICD-10-CM | POA: Diagnosis not present

## 2024-05-31 DIAGNOSIS — Z3480 Encounter for supervision of other normal pregnancy, unspecified trimester: Secondary | ICD-10-CM

## 2024-05-31 NOTE — Patient Instructions (Signed)
 Bailey Palmer, thank you for choosing our office today! We appreciate the opportunity to meet your healthcare needs. You may receive a short survey by mail, e-mail, or through [employer]. If you are happy with your care we would appreciate if you could take just a few minutes to complete the survey questions. We read all of your comments and take your feedback very seriously. Thank you again for choosing our office.  Center for [employer] Team at [facility]  [facility] at [facility] ([address] [place], [place] [address]) Entrance C, located off of [address] Free 24/7 valet parking   You will have your sugar test next visit.  Please do not eat or drink anything after midnight the night before you come, not even water.  You will be here for at least two hours.  Please make an appointment online for the bloodwork at [email or web address] for 8:00am (or as close to this as possible). Make sure you select the [facility] service center.   CLASSES: Go to [email or web address] to register for classes (childbirth, breastfeeding, waterbirth, infant CPR, daddy bootcamp, etc.)  Call the office [phone]) or go to [facility] if: You begin to have strong, frequent contractions Your water breaks.  Sometimes it is a big gush of fluid, sometimes it is just a trickle that keeps getting your panties wet or running down your legs You have vaginal bleeding.  It is normal to have a small amount of spotting if your cervix was checked.  You don't feel your baby moving like normal.  If you don't, get you something to eat and drink and lay down and focus on feeling your baby move.   If your baby is still not moving like normal, you should call the office or go to [facility].  Call the office [phone]) or go to [facility] hospital for these signs of pre-eclampsia: Severe headache that does not go away with Tylenol  Visual changes- seeing spots, double, blurred vision Pain under your right breast or  upper abdomen that does not go away with Tums or heartburn medicine Nausea and/or vomiting Severe swelling in your hands, feet, and face    [place] [facility]/Family Doctors [facility]): [address] Luba BROCKS, [phone]           [facility]: [address], [phone]                [facility]): [address], [phone]  [facility] Department: [address], [place], [phone]    [facility] Pediatricians/Family Doctors [facility]): [address], [phone] [facility]: [address] [place], [phone] [facility]: [address], [phone]  [facility] Doctors  [facility]): [phone] [facility]: [address], [phone]   [facility] Doctors [facility]: [address] [phone]  [facility] Doctors  [facility]: [phone], [phone]  Home Blood Pressure Monitoring for Patients   Your provider has recommended that you check your blood pressure (BP) at least once a week at home. If you do not have a blood pressure cuff at home, one will be provided for you. Contact your provider if you have not received your monitor within 1 week.   Helpful Tips for Accurate Home Blood Pressure Checks  Don't smoke, exercise, or drink caffeine 30 minutes before checking  your BP Use the restroom before checking your BP (a full bladder can raise your pressure) Relax in a comfortable upright chair Feet on the ground Left arm resting comfortably on a flat surface at the level of your heart Legs uncrossed Back supported Sit quietly and don't talk Place the cuff on your bare arm Adjust snuggly, so that only two fingertips can fit between your skin and the top of the cuff Check 2 readings separated by at least one  minute Keep a log of your BP readings For a visual, please reference this diagram: http://[facility]nc.care/bpdiagram  Provider Name: [facility]     Phone: [phone]  Zone 1: ALL CLEAR  Continue to monitor your symptoms:  BP reading is less than 140 (top number) or less than 90 (bottom number)  No right upper stomach pain No headaches or seeing spots No feeling nauseated or throwing up No swelling in face and hands  Zone 2: CAUTION Call your doctor's office for any of the following:  BP reading is greater than 140 (top number) or greater than 90 (bottom number)  Stomach pain under your ribs in the middle or right side Headaches or seeing spots Feeling nauseated or throwing up Swelling in face and hands  Zone 3: EMERGENCY  Seek immediate medical care if you have any of the following:  BP reading is greater than160 (top number) or greater than 110 (bottom number) Severe headaches not improving with Tylenol  Serious difficulty catching your breath Any worsening symptoms from Zone 2   Second Trimester of Pregnancy The second trimester is from week 13 through week 28, months 4 through 6. The second trimester is often a time when you feel your best. Your body has also adjusted to being pregnant, and you begin to feel better physically. Usually, morning sickness has lessened or quit completely, you may have more energy, and you may have an increase in appetite. The second trimester is also a time when the fetus is growing rapidly. At the end of the sixth month, the fetus is about 9 inches long and weighs about 1 pounds. You will likely begin to feel the baby move (quickening) between 18 and 20 weeks of the pregnancy. BODY CHANGES Your body goes through many changes during pregnancy. The changes vary from woman to woman.  Your weight will continue to increase. You will notice your lower abdomen bulging out. You may begin to get stretch marks on your hips, abdomen, and breasts. You may  develop headaches that can be relieved by medicines approved by your health care provider. You may urinate more often because the fetus is pressing on your bladder. You may develop or continue to have heartburn as a result of your pregnancy. You may develop constipation because certain hormones are causing the muscles that push waste through your intestines to slow down. You may develop hemorrhoids or swollen, bulging veins (varicose veins). You may have back pain because of the weight gain and pregnancy hormones relaxing your joints between the bones in your pelvis and as a result of a shift in weight and the muscles that support your balance. Your breasts will continue to grow and be tender. Your gums may bleed and may be sensitive to brushing and flossing. Dark spots or blotches (chloasma, mask of pregnancy) may develop on your face. This will likely fade after the baby is born. A dark line from your belly button to the pubic area (linea nigra) may appear. This will likely fade after the  baby is born. You may have changes in your hair. These can include thickening of your hair, rapid growth, and changes in texture. Some women also have hair loss during or after pregnancy, or hair that feels dry or thin. Your hair will most likely return to normal after your baby is born. WHAT TO EXPECT AT YOUR PRENATAL VISITS During a routine prenatal visit: You will be weighed to make sure you and the fetus are growing normally. Your blood pressure will be taken. Your abdomen will be measured to track your baby's growth. The fetal heartbeat will be listened to. Any test results from the previous visit will be discussed. Your health care provider may ask you: How you are feeling. If you are feeling the baby move. If you have had any abnormal symptoms, such as leaking fluid, bleeding, severe headaches, or abdominal cramping. If you have any questions. Other tests that may be performed during your second  trimester include: Blood tests that check for: Low iron levels (anemia). Gestational diabetes (between 24 and 28 weeks). Rh antibodies. Urine tests to check for infections, diabetes, or protein in the urine. An ultrasound to confirm the proper growth and development of the baby. An amniocentesis to check for possible genetic problems. Fetal screens for spina bifida and Down syndrome. HOME CARE INSTRUCTIONS  Avoid all smoking, herbs, alcohol, and unprescribed drugs. These chemicals affect the formation and growth of the baby. Follow your health care provider's instructions regarding medicine use. There are medicines that are either safe or unsafe to take during pregnancy. Exercise only as directed by your health care provider. Experiencing uterine cramps is a good sign to stop exercising. Continue to eat regular, healthy meals. Wear a good support bra for breast tenderness. Do not use hot tubs, steam rooms, or saunas. Wear your seat belt at all times when driving. Avoid raw meat, uncooked cheese, cat litter boxes, and soil used by cats. These carry germs that can cause birth defects in the baby. Take your prenatal vitamins. Try taking a stool softener (if your health care provider approves) if you develop constipation. Eat more high-fiber foods, such as fresh vegetables or fruit and whole grains. Drink plenty of fluids to keep your urine clear or pale yellow. Take warm sitz baths to soothe any pain or discomfort caused by hemorrhoids. Use hemorrhoid cream if your health care provider approves. If you develop varicose veins, wear support hose. Elevate your feet for 15 minutes, 3-4 times a day. Limit salt in your diet. Avoid heavy lifting, wear low heel shoes, and practice good posture. Rest with your legs elevated if you have leg cramps or low back pain. Visit your dentist if you have not gone yet during your pregnancy. Use a soft toothbrush to brush your teeth and be gentle when you floss. A  sexual relationship may be continued unless your health care provider directs you otherwise. Continue to go to all your prenatal visits as directed by your health care provider. SEEK MEDICAL CARE IF:  You have dizziness. You have mild pelvic cramps, pelvic pressure, or nagging pain in the abdominal area. You have persistent nausea, vomiting, or diarrhea. You have a bad smelling vaginal discharge. You have pain with urination. SEEK IMMEDIATE MEDICAL CARE IF:  You have a fever. You are leaking fluid from your vagina. You have spotting or bleeding from your vagina. You have severe abdominal cramping or pain. You have rapid weight gain or loss. You have shortness of breath with chest pain. You  notice sudden or extreme swelling of your face, hands, ankles, feet, or legs. You have not felt your baby move in over an hour. You have severe headaches that do not go away with medicine. You have vision changes. Document Released: 11/18/2001 Document Revised: 11/29/2013 Document Reviewed: 01/25/2013 [facility] Patient Information 2015 [place], [place]. This information is not intended to replace advice given to you by your health care provider. Make sure you discuss any questions you have with your health care provider.

## 2024-05-31 NOTE — Progress Notes (Signed)
 LOW-RISK PREGNANCY VISIT Patient name: Bailey Palmer MRN [id number]  Date of birth: Feb 26, 1996 Chief Complaint:   Routine Prenatal Visit  History of Present Illness:   Bailey Palmer is a 28 y.o. G29P3003 female at [redacted]w[redacted]d with an Estimated Date of Delivery: 09/17/24 being seen today for ongoing management of a low-risk pregnancy.   Today she reports occasional contractions. Contractions: Not present.  .  Movement: Present. denies leaking of fluid.     03/08/2024    8:36 AM 05/18/2023    9:06 AM 02/03/2023    2:26 PM 04/17/2021    8:48 AM 02/16/2020   11:26 AM  Depression screen PHQ 2/9  Decreased Interest 0 0 0 0 0  Down, Depressed, Hopeless 0 0 0 0 0  PHQ - 2 Score 0 0 0 0 0  Altered sleeping 0 1 1 1    Tired, decreased energy 1  0 0   Change in appetite 2 0 0 0   Feeling bad or failure about yourself  0 0 0 0   Trouble concentrating 0 0 0 0   Moving slowly or fidgety/restless 0 0 0 0   Suicidal thoughts 0 0 0 0   PHQ-9 Score 3 1 1 1    Difficult doing work/chores  Not difficult at all           03/08/2024    8:36 AM 02/03/2023    2:26 PM 04/17/2021    8:48 AM  GAD 7 : Generalized Anxiety Score  Nervous, Anxious, on Edge 0 0 0  Control/stop worrying 0 0 0  Worry too much - different things 0 0 0  Trouble relaxing 0 0 0  Restless 0 0 0  Easily annoyed or irritable 0 0 0  Afraid - awful might happen 0 0 0  Total GAD 7 Score 0 0 0      Review of Systems:   Pertinent items are noted in HPI Denies abnormal vaginal discharge w/ itching/odor/irritation, headaches, visual changes, shortness of breath, chest pain, abdominal pain, severe nausea/vomiting, or problems with urination or bowel movements unless otherwise stated above. Pertinent History Reviewed:  Reviewed past medical,surgical, social, obstetrical and family history.  Reviewed problem list, medications and allergies. Physical Assessment:   Vitals:   05/31/24 0848  BP: (!) 116/58  Pulse: 98  Weight: 202 lb  6.4 oz (91.8 kg)  Body mass index is 35.85 kg/m.        Physical Examination:   General appearance: Well appearing, and in no distress  Mental status: Alert, oriented to person, place, and time  Skin: Warm & dry  Cardiovascular: Normal heart rate noted  Respiratory: Normal respiratory effort, no distress  Abdomen: Soft, gravid, nontender  Pelvic: Cervical exam deferred         Extremities: Edema: None  Fetal Status: Fetal Heart Rate (bpm): 138 Fundal Height: 25 cm Movement: Present    Chaperone: N/A No results found for this or any previous visit (from the past 24 hours).  Assessment & Plan:  1) Low-risk pregnancy G4P3003 at [redacted]w[redacted]d with an Estimated Date of Delivery: 09/17/24    Meds: No orders of the defined types were placed in this encounter.  Labs/procedures today: none  Plan:  Continue routine obstetrical care  Next visit: prefers will be in person for pn2    Reviewed: Preterm labor symptoms and general obstetric precautions including but not limited to vaginal bleeding, contractions, leaking of fluid and fetal movement were reviewed in detail  with the patient.  All questions were answered. Does have home bp cuff. Office bp cuff given: not applicable. Check bp weekly, let us  know if consistently >140 and/or >90.  Follow-up: Return in about 4 weeks (around 06/28/2024) for LROB, PN2, CNM, in person.  No future appointments.  No orders of the defined types were placed in this encounter.  Suzen JONELLE Fetters CNM, [facility] 05/31/2024 9:13 AM

## 2024-06-28 ENCOUNTER — Other Ambulatory Visit

## 2024-06-28 ENCOUNTER — Ambulatory Visit: Admitting: Women's Health

## 2024-06-28 ENCOUNTER — Encounter: Payer: Self-pay | Admitting: Women's Health

## 2024-06-28 VITALS — BP 112/63 | HR 96 | Wt 196.6 lb

## 2024-06-28 DIAGNOSIS — Z3482 Encounter for supervision of other normal pregnancy, second trimester: Secondary | ICD-10-CM | POA: Diagnosis not present

## 2024-06-28 DIAGNOSIS — Z131 Encounter for screening for diabetes mellitus: Secondary | ICD-10-CM | POA: Diagnosis not present

## 2024-06-28 DIAGNOSIS — Z23 Encounter for immunization: Secondary | ICD-10-CM | POA: Diagnosis not present

## 2024-06-28 DIAGNOSIS — Z3A28 28 weeks gestation of pregnancy: Secondary | ICD-10-CM

## 2024-06-28 DIAGNOSIS — Z3483 Encounter for supervision of other normal pregnancy, third trimester: Secondary | ICD-10-CM

## 2024-06-28 DIAGNOSIS — R350 Frequency of micturition: Secondary | ICD-10-CM

## 2024-06-28 LAB — POCT URINALYSIS DIPSTICK OB
Blood, UA: NEGATIVE
Glucose, UA: NEGATIVE
Nitrite, UA: NEGATIVE

## 2024-06-28 MED ORDER — NITROFURANTOIN MONOHYD MACRO 100 MG PO CAPS: 100.0000 mg | ORAL_CAPSULE | Freq: Two times a day (BID) | ORAL | 0 refills | Status: DC

## 2024-06-28 NOTE — Patient Instructions (Signed)
 Bailey Palmer, thank you for choosing our office today! We appreciate the opportunity to meet your healthcare needs. You may receive a short survey by mail, e-mail, or through [employer]. If you are happy with your care we would appreciate if you could take just a few minutes to complete the survey questions. We read all of your comments and take your feedback very seriously. Thank you again for choosing our office.  Center for [employer] Team at [facility]  [facility] at [facility] ([address] [place], [place] [address]) Entrance C, located off of [address] Free 24/7 valet parking   CLASSES: Go to [email or web address] to register for classes (childbirth, breastfeeding, waterbirth, infant CPR, daddy bootcamp, etc.)  Call the office [phone]) or go to [facility] if: You begin to have strong, frequent contractions Your water breaks.  Sometimes it is a big gush of fluid, sometimes it is just a trickle that keeps getting your panties wet or running down your legs You have vaginal bleeding.  It is normal to have a small amount of spotting if your cervix was checked.  You don't feel your baby moving like normal.  If you don't, get you something to eat and drink and lay down and focus on feeling your baby move.   If your baby is still not moving like normal, you should call the office or go to [facility].  Call the office [phone]) or go to [facility] hospital for these signs of pre-eclampsia: Severe headache that does not go away with Tylenol  Visual changes- seeing spots, double, blurred vision Pain under your right breast or upper abdomen that does not go away with Tums or heartburn medicine Nausea and/or vomiting Severe swelling in your hands, feet, and face   Tdap Vaccine It is recommended that you get the Tdap vaccine during the third trimester of EACH pregnancy to help protect your baby from getting pertussis (whooping cough) 27-36 weeks is the BEST time to do  this so that you can pass the protection on to your baby. During pregnancy is better than after pregnancy, but if you are unable to get it during pregnancy it will be offered at the hospital.  You can get this vaccine with us , at the health department, your family doctor, or some local pharmacies Everyone who will be around your baby should also be up-to-date on their vaccines before the baby comes. Adults (who are not pregnant) only need 1 dose of Tdap during adulthood.   [facility] Pediatricians/Family Doctors [facility]): [address] Luba BROCKS, [phone]           [facility]: [address], [phone]                [facility] Medicine [facility]): [address], [phone] (call to ask if accepting patients) [facility] Department: [address], [place], [phone]    [facility] Pediatricians/Family Doctors [facility]): [phone], [phone] [facility]: [address] [place], [phone] [facility]: [address], [phone]  [facility] Doctors  [facility]): [phone] [facility]: [address] [phone]   [facility] Doctors [facility]: [address], [phone]  [facility] Family Doctors  [facility]: [phone], [phone]  Home Blood Pressure Monitoring for Patients   Your provider has recommended that you check your  blood pressure (BP) at least once a week at home. If you do not have a blood pressure cuff at home, one will be provided for you. Contact your provider if you have not received your monitor within 1 week.   Helpful Tips for Accurate Home Blood Pressure Checks  Don't smoke, exercise, or drink caffeine 30 minutes before checking your BP Use the restroom before checking your BP (a full bladder can raise your  pressure) Relax in a comfortable upright chair Feet on the ground Left arm resting comfortably on a flat surface at the level of your heart Legs uncrossed Back supported Sit quietly and don't talk Place the cuff on your bare arm Adjust snuggly, so that only two fingertips can fit between your skin and the top of the cuff Check 2 readings separated by at least one minute Keep a log of your BP readings For a visual, please reference this diagram: http://ccnc.care/bpdiagram  Provider Name: [facility]     Phone: [phone]  Zone 1: ALL CLEAR  Continue to monitor your symptoms:  BP reading is less than 140 (top number) or less than 90 (bottom number)  No right upper stomach pain No headaches or seeing spots No feeling nauseated or throwing up No swelling in face and hands  Zone 2: CAUTION Call your doctor's office for any of the following:  BP reading is greater than 140 (top number) or greater than 90 (bottom number)  Stomach pain under your ribs in the middle or right side Headaches or seeing spots Feeling nauseated or throwing up Swelling in face and hands  Zone 3: EMERGENCY  Seek immediate medical care if you have any of the following:  BP reading is greater than160 (top number) or greater than 110 (bottom number) Severe headaches not improving with Tylenol  Serious difficulty catching your breath Any worsening symptoms from Zone 2   Third Trimester of Pregnancy The third trimester is from week 29 through week 42, months 7 through 9. The third trimester is a time when the fetus is growing rapidly. At the end of the ninth month, the fetus is about 20 inches in length and weighs 6-10 pounds.  BODY CHANGES Your body goes through many changes during pregnancy. The changes vary from woman to woman.  Your weight will continue to increase. You can expect to gain 25-35 pounds (11-16 kg) by the end of the pregnancy. You may begin to get stretch marks on your hips, abdomen,  and breasts. You may urinate more often because the fetus is moving lower into your pelvis and pressing on your bladder. You may develop or continue to have heartburn as a result of your pregnancy. You may develop constipation because certain hormones are causing the muscles that push waste through your intestines to slow down. You may develop hemorrhoids or swollen, bulging veins (varicose veins). You may have pelvic pain because of the weight gain and pregnancy hormones relaxing your joints between the bones in your pelvis. Backaches may result from overexertion of the muscles supporting your posture. You may have changes in your hair. These can include thickening of your hair, rapid growth, and changes in texture. Some women also have hair loss during or after pregnancy, or hair that feels dry or thin. Your hair will most likely return to normal after your baby is born. Your breasts will continue to grow and be tender. A yellow discharge may leak from your breasts called colostrum. Your belly button may stick out. You may  feel short of breath because of your expanding uterus. You may notice the fetus dropping, or moving lower in your abdomen. You may have a bloody mucus discharge. This usually occurs a few days to a week before labor begins. Your cervix becomes thin and soft (effaced) near your due date. WHAT TO EXPECT AT YOUR PRENATAL EXAMS  You will have prenatal exams every 2 weeks until week 36. Then, you will have weekly prenatal exams. During a routine prenatal visit: You will be weighed to make sure you and the fetus are growing normally. Your blood pressure is taken. Your abdomen will be measured to track your baby's growth. The fetal heartbeat will be listened to. Any test results from the previous visit will be discussed. You may have a cervical check near your due date to see if you have effaced. At around 36 weeks, your caregiver will check your cervix. At the same time, your  caregiver will also perform a test on the secretions of the vaginal tissue. This test is to determine if a type of bacteria, Group B streptococcus, is present. Your caregiver will explain this further. Your caregiver may ask you: What your birth plan is. How you are feeling. If you are feeling the baby move. If you have had any abnormal symptoms, such as leaking fluid, bleeding, severe headaches, or abdominal cramping. If you have any questions. Other tests or screenings that may be performed during your third trimester include: Blood tests that check for low iron levels (anemia). Fetal testing to check the health, activity level, and growth of the fetus. Testing is done if you have certain medical conditions or if there are problems during the pregnancy. FALSE LABOR You may feel small, irregular contractions that eventually go away. These are called Braxton Hicks contractions, or false labor. Contractions may last for hours, days, or even weeks before true labor sets in. If contractions come at regular intervals, intensify, or become painful, it is best to be seen by your caregiver.  SIGNS OF LABOR  Menstrual-like cramps. Contractions that are 5 minutes apart or less. Contractions that start on the top of the uterus and spread down to the lower abdomen and back. A sense of increased pelvic pressure or back pain. A watery or bloody mucus discharge that comes from the vagina. If you have any of these signs before the 37th week of pregnancy, call your caregiver right away. You need to go to the hospital to get checked immediately. HOME CARE INSTRUCTIONS  Avoid all smoking, herbs, alcohol, and unprescribed drugs. These chemicals affect the formation and growth of the baby. Follow your caregiver's instructions regarding medicine use. There are medicines that are either safe or unsafe to take during pregnancy. Exercise only as directed by your caregiver. Experiencing uterine cramps is a good sign to  stop exercising. Continue to eat regular, healthy meals. Wear a good support bra for breast tenderness. Do not use hot tubs, steam rooms, or saunas. Wear your seat belt at all times when driving. Avoid raw meat, uncooked cheese, cat litter boxes, and soil used by cats. These carry germs that can cause birth defects in the baby. Take your prenatal vitamins. Try taking a stool softener (if your caregiver approves) if you develop constipation. Eat more high-fiber foods, such as fresh vegetables or fruit and whole grains. Drink plenty of fluids to keep your urine clear or pale yellow. Take warm sitz baths to soothe any pain or discomfort caused by hemorrhoids. Use hemorrhoid cream if  your caregiver approves. If you develop varicose veins, wear support hose. Elevate your feet for 15 minutes, 3-4 times a day. Limit salt in your diet. Avoid heavy lifting, wear low heal shoes, and practice good posture. Rest a lot with your legs elevated if you have leg cramps or low back pain. Visit your dentist if you have not gone during your pregnancy. Use a soft toothbrush to brush your teeth and be gentle when you floss. A sexual relationship may be continued unless your caregiver directs you otherwise. Do not travel far distances unless it is absolutely necessary and only with the approval of your caregiver. Take prenatal classes to understand, practice, and ask questions about the labor and delivery. Make a trial run to the hospital. Pack your hospital bag. Prepare the baby's nursery. Continue to go to all your prenatal visits as directed by your caregiver. SEEK MEDICAL CARE IF: You are unsure if you are in labor or if your water has broken. You have dizziness. You have mild pelvic cramps, pelvic pressure, or nagging pain in your abdominal area. You have persistent nausea, vomiting, or diarrhea. You have a bad smelling vaginal discharge. You have pain with urination. SEEK IMMEDIATE MEDICAL CARE IF:  You  have a fever. You are leaking fluid from your vagina. You have spotting or bleeding from your vagina. You have severe abdominal cramping or pain. You have rapid weight loss or gain. You have shortness of breath with chest pain. You notice sudden or extreme swelling of your face, hands, ankles, feet, or legs. You have not felt your baby move in over an hour. You have severe headaches that do not go away with medicine. You have vision changes. Document Released: 11/18/2001 Document Revised: 11/29/2013 Document Reviewed: 01/25/2013 [facility] Patient Information 2015 [place], [place]. This information is not intended to replace advice given to you by your health care provider. Make sure you discuss any questions you have with your health care provider.

## 2024-06-28 NOTE — Progress Notes (Signed)
 LOW-RISK PREGNANCY VISIT Patient name: Bailey Palmer MRN [id number]  Date of birth: 1996-10-22 Chief Complaint:   Routine Prenatal Visit  History of Present Illness:   Bailey Palmer is a 28 y.o. G81P3003 female at [redacted]w[redacted]d with an Estimated Date of Delivery: 09/17/24 being seen today for ongoing management of a low-risk pregnancy.   Today she reports urinary frequency and not a lot comes out. No burning/discomfort. Contractions: Not present. Vag. Bleeding: None.  Movement: Present. denies leaking of fluid.     06/28/2024    9:03 AM 03/08/2024    8:36 AM 05/18/2023    9:06 AM 02/03/2023    2:26 PM 04/17/2021    8:48 AM  Depression screen PHQ 2/9  Decreased Interest 0 0 0 0 0  Down, Depressed, Hopeless 0 0 0 0 0  PHQ - 2 Score 0 0 0 0 0  Altered sleeping 0 0 1 1 1   Tired, decreased energy 0 1  0 0  Change in appetite 0 2 0 0 0  Feeling bad or failure about yourself  0 0 0 0 0  Trouble concentrating 0 0 0 0 0  Moving slowly or fidgety/restless 0 0 0 0 0  Suicidal thoughts 0 0 0 0 0  PHQ-9 Score 0 3 1 1 1   Difficult doing work/chores   Not difficult at all          03/08/2024    8:36 AM 02/03/2023    2:26 PM 04/17/2021    8:48 AM  GAD 7 : Generalized Anxiety Score  Nervous, Anxious, on Edge 0 0 0  Control/stop worrying 0 0 0  Worry too much - different things 0 0 0  Trouble relaxing 0 0 0  Restless 0 0 0  Easily annoyed or irritable 0 0 0  Afraid - awful might happen 0 0 0  Total GAD 7 Score 0 0 0      Review of Systems:   Pertinent items are noted in HPI Denies abnormal vaginal discharge w/ itching/odor/irritation, headaches, visual changes, shortness of breath, chest pain, abdominal pain, severe nausea/vomiting, or problems with urination or bowel movements unless otherwise stated above. Pertinent History Reviewed:  Reviewed past medical,surgical, social, obstetrical and family history.  Reviewed problem list, medications and allergies. Physical Assessment:    Vitals:   06/28/24 0849  BP: 112/63  Pulse: 96  Weight: 196 lb 9.6 oz (89.2 kg)  Body mass index is 34.83 kg/m.        Physical Examination:   General appearance: Well appearing, and in no distress  Mental status: Alert, oriented to person, place, and time  Skin: Warm & dry  Cardiovascular: Normal heart rate noted  Respiratory: Normal respiratory effort, no distress  Abdomen: Soft, gravid, nontender  Pelvic: Cervical exam deferred         Extremities:    Fetal Status: Fetal Heart Rate (bpm): 144 Fundal Height: 28 cm Movement: Present    Chaperone: N/A Results for orders placed or performed in visit on 06/28/24 (from the past 24 hours)  POC Urinalysis Dipstick OB   Collection Time: 06/28/24  9:06 AM  Result Value Ref Range   Color, UA     Clarity, UA     Glucose, UA Negative Negative   Bilirubin, UA     Ketones, UA small    Spec Grav, UA     Blood, UA neg    pH, UA     POC,PROTEIN,UA Small (1+) Negative,  Trace, Small (1+), Moderate (2+), Large (3+), 4+   Urobilinogen, UA     Nitrite, UA neg    Leukocytes, UA Small (1+) (A) Negative   Appearance     Odor      Assessment & Plan:  1) Low-risk pregnancy G4P3003 at [redacted]w[redacted]d with an Estimated Date of Delivery: 09/17/24   2) Urinary frequency/hesitancy, rx macrobid , send cx   Meds:  Meds ordered this encounter  Medications   nitrofurantoin , macrocrystal-monohydrate, (MACROBID ) 100 MG capsule    Sig: Take 1 capsule (100 mg total) by mouth 2 (two) times daily. X 7 days    Dispense:  14 capsule    Refill:  0   Labs/procedures today: Tdap and PN2  Plan:  Continue routine obstetrical care  Next visit: prefers in person    Reviewed: Preterm labor symptoms and general obstetric precautions including but not limited to vaginal bleeding, contractions, leaking of fluid and fetal movement were reviewed in detail with the patient.  All questions were answered. Does have home bp cuff. Office bp cuff given: not applicable. Check  bp weekly, let us  know if consistently >140 and/or >90.  Follow-up: Return in about 4 weeks (around 07/26/2024) for LROB, CNM, in person.  Future Appointments  Date Time Provider Department Center  07/26/2024  9:50 AM Kizzie Suzen SAUNDERS, CNM [facility]    Orders Placed This Encounter  Procedures   Urine Culture   Tdap vaccine greater than or equal to 7yo IM   POC Urinalysis Dipstick OB   Suzen SAUNDERS Kizzie CNM, [facility] 06/28/2024 9:18 AM

## 2024-06-29 ENCOUNTER — Ambulatory Visit: Payer: Self-pay | Admitting: Women's Health

## 2024-06-29 ENCOUNTER — Telehealth: Payer: Self-pay

## 2024-06-29 ENCOUNTER — Encounter: Payer: Self-pay | Admitting: Women's Health

## 2024-06-29 ENCOUNTER — Other Ambulatory Visit: Payer: Self-pay | Admitting: Women's Health

## 2024-06-29 DIAGNOSIS — D509 Iron deficiency anemia, unspecified: Secondary | ICD-10-CM | POA: Insufficient documentation

## 2024-06-29 DIAGNOSIS — O99019 Anemia complicating pregnancy, unspecified trimester: Secondary | ICD-10-CM | POA: Insufficient documentation

## 2024-06-29 DIAGNOSIS — O99013 Anemia complicating pregnancy, third trimester: Secondary | ICD-10-CM

## 2024-06-29 LAB — CBC
Hematocrit: 29 % — ABNORMAL LOW (ref 34.0–46.6)
Hemoglobin: 8.9 g/dL — ABNORMAL LOW (ref 11.1–15.9)
MCH: 24.5 pg — ABNORMAL LOW (ref 26.6–33.0)
MCHC: 30.7 g/dL — ABNORMAL LOW (ref 31.5–35.7)
MCV: 80 fL (ref 79–97)
Platelets: 264 x10E3/uL (ref 150–450)
RBC: 3.64 x10E6/uL — ABNORMAL LOW (ref 3.77–5.28)
RDW: 14.2 % (ref 11.7–15.4)
WBC: 6.3 x10E3/uL (ref 3.4–10.8)

## 2024-06-29 LAB — RPR: RPR Ser Ql: NONREACTIVE

## 2024-06-29 LAB — HIV ANTIBODY (ROUTINE TESTING W REFLEX): HIV Screen 4th Generation wRfx: NONREACTIVE

## 2024-06-29 LAB — GLUCOSE TOLERANCE, 2 HOURS W/ 1HR
Glucose, 1 hour: 130 mg/dL (ref 70–179)
Glucose, 2 hour: 123 mg/dL (ref 70–152)
Glucose, Fasting: 90 mg/dL (ref 70–91)

## 2024-06-29 LAB — ANTIBODY SCREEN: Antibody Screen: NEGATIVE

## 2024-06-29 NOTE — Telephone Encounter (Signed)
 Hello,  Patient will be scheduled as soon as possible.  Auth Submission: NO AUTH NEEDED Site of care: Site of care: [facility] Payer: [place] MEDICAID [place] BLUE  Medication & CPT/J Code(s) submitted: Venofer (Iron Sucrose) J1756 Diagnosis Code:  Route of submission (phone, fax, portal): phone Phone # Fax # Auth type: Buy/Bill HB Units/visits requested: 200mg  x 5doses Reference number:  Approval from: 06/29/24 to 12/07/24

## 2024-06-30 ENCOUNTER — Ambulatory Visit: Payer: Self-pay | Admitting: Women's Health

## 2024-06-30 LAB — URINE CULTURE

## 2024-07-08 ENCOUNTER — Encounter: Attending: Women's Health | Admitting: Emergency Medicine

## 2024-07-08 VITALS — BP 111/66 | HR 97 | Temp 97.7°F | Resp 16

## 2024-07-08 DIAGNOSIS — Z3A29 29 weeks gestation of pregnancy: Secondary | ICD-10-CM

## 2024-07-08 DIAGNOSIS — D509 Iron deficiency anemia, unspecified: Secondary | ICD-10-CM

## 2024-07-08 DIAGNOSIS — O99013 Anemia complicating pregnancy, third trimester: Secondary | ICD-10-CM

## 2024-07-08 DIAGNOSIS — Z3480 Encounter for supervision of other normal pregnancy, unspecified trimester: Secondary | ICD-10-CM

## 2024-07-08 MED ORDER — IRON SUCROSE 20 MG/ML IV SOLN
200.0000 mg | Freq: Once | INTRAVENOUS | Status: AC
  Administered 2024-07-08: 200 mg via INTRAVENOUS

## 2024-07-08 MED ORDER — DIPHENHYDRAMINE HCL 25 MG PO CAPS
25.0000 mg | ORAL_CAPSULE | Freq: Once | ORAL | Status: AC
  Administered 2024-07-08: 25 mg via ORAL

## 2024-07-08 MED ORDER — ACETAMINOPHEN 325 MG PO TABS
650.0000 mg | ORAL_TABLET | Freq: Once | ORAL | Status: AC
  Administered 2024-07-08: 650 mg via ORAL

## 2024-07-08 NOTE — Progress Notes (Addendum)
 Diagnosis: Iron Deficiency Anemia  Provider:  Kizzie Iha CNM  Procedure: IV Push  IV Type: Peripheral, IV Location: L Antecubital  Venofer (Iron Sucrose), Dose: 200 mg  Post Infusion IV Care: Observation period completed and Peripheral IV Discontinued  Discharge: Condition: Good, Destination: Home . AVS Declined  Performed by:  Delon ONEIDA Officer, RN

## 2024-07-13 ENCOUNTER — Encounter (INDEPENDENT_AMBULATORY_CARE_PROVIDER_SITE_OTHER): Admitting: Emergency Medicine

## 2024-07-13 VITALS — BP 111/70 | HR 81 | Temp 98.0°F | Resp 15

## 2024-07-13 DIAGNOSIS — Z3480 Encounter for supervision of other normal pregnancy, unspecified trimester: Secondary | ICD-10-CM

## 2024-07-13 DIAGNOSIS — Z3A30 30 weeks gestation of pregnancy: Secondary | ICD-10-CM | POA: Diagnosis not present

## 2024-07-13 DIAGNOSIS — O99013 Anemia complicating pregnancy, third trimester: Secondary | ICD-10-CM | POA: Diagnosis not present

## 2024-07-13 DIAGNOSIS — D509 Iron deficiency anemia, unspecified: Secondary | ICD-10-CM

## 2024-07-13 MED ORDER — IRON SUCROSE 20 MG/ML IV SOLN
200.0000 mg | Freq: Once | INTRAVENOUS | Status: AC
  Administered 2024-07-13: 200 mg via INTRAVENOUS

## 2024-07-13 MED ORDER — ACETAMINOPHEN 325 MG PO TABS
650.0000 mg | ORAL_TABLET | Freq: Once | ORAL | Status: AC
  Administered 2024-07-13: 650 mg via ORAL

## 2024-07-13 MED ORDER — DIPHENHYDRAMINE HCL 25 MG PO CAPS
25.0000 mg | ORAL_CAPSULE | Freq: Once | ORAL | Status: AC
  Administered 2024-07-13: 25 mg via ORAL

## 2024-07-13 NOTE — Progress Notes (Signed)
 Diagnosis: Iron  Deficiency Anemia  Provider:  Booker,K  Procedure: IV Push  IV Type: Peripheral, IV Location: L Antecubital  Venofer  (Iron  Sucrose), Dose: 200 mg  Post Infusion IV Care: Observation period completed and Peripheral IV Discontinued  Discharge: Condition: Good, Destination: Home . AVS Declined  Performed by:  Delon ONEIDA Officer, RN

## 2024-07-15 ENCOUNTER — Encounter (INDEPENDENT_AMBULATORY_CARE_PROVIDER_SITE_OTHER): Admitting: *Deleted

## 2024-07-15 VITALS — BP 102/72 | HR 84 | Temp 98.2°F | Resp 16

## 2024-07-15 DIAGNOSIS — Z3A30 30 weeks gestation of pregnancy: Secondary | ICD-10-CM | POA: Diagnosis not present

## 2024-07-15 DIAGNOSIS — O99013 Anemia complicating pregnancy, third trimester: Secondary | ICD-10-CM

## 2024-07-15 DIAGNOSIS — D509 Iron deficiency anemia, unspecified: Secondary | ICD-10-CM | POA: Diagnosis not present

## 2024-07-15 DIAGNOSIS — Z3480 Encounter for supervision of other normal pregnancy, unspecified trimester: Secondary | ICD-10-CM

## 2024-07-15 MED ORDER — IRON SUCROSE 20 MG/ML IV SOLN
200.0000 mg | Freq: Once | INTRAVENOUS | Status: AC
  Administered 2024-07-15: 200 mg via INTRAVENOUS

## 2024-07-15 MED ORDER — ACETAMINOPHEN 325 MG PO TABS
650.0000 mg | ORAL_TABLET | Freq: Once | ORAL | Status: AC
  Administered 2024-07-15: 650 mg via ORAL

## 2024-07-15 MED ORDER — DIPHENHYDRAMINE HCL 25 MG PO CAPS
25.0000 mg | ORAL_CAPSULE | Freq: Once | ORAL | Status: AC
  Administered 2024-07-15: 25 mg via ORAL

## 2024-07-15 NOTE — Progress Notes (Signed)
 Diagnosis: Iron  Deficiency Anemia  Provider:  Suzen Fetters, CNM  Procedure: IV Push  IV Type: Peripheral, IV Location: R Antecubital  Venofer  (Iron  Sucrose), Dose: 200 mg  Post Infusion IV Care: Observation period completed  Discharge: Condition: Good, Destination: Home . AVS Provided  Performed by:  Baldwin Darice Helling, RN

## 2024-07-20 ENCOUNTER — Encounter (INDEPENDENT_AMBULATORY_CARE_PROVIDER_SITE_OTHER): Admitting: Emergency Medicine

## 2024-07-20 VITALS — BP 104/64 | HR 91 | Temp 98.0°F | Resp 18

## 2024-07-20 DIAGNOSIS — D509 Iron deficiency anemia, unspecified: Secondary | ICD-10-CM

## 2024-07-20 DIAGNOSIS — Z3480 Encounter for supervision of other normal pregnancy, unspecified trimester: Secondary | ICD-10-CM

## 2024-07-20 DIAGNOSIS — Z3A31 31 weeks gestation of pregnancy: Secondary | ICD-10-CM | POA: Diagnosis not present

## 2024-07-20 DIAGNOSIS — O99013 Anemia complicating pregnancy, third trimester: Secondary | ICD-10-CM | POA: Diagnosis not present

## 2024-07-20 MED ORDER — DIPHENHYDRAMINE HCL 25 MG PO CAPS
25.0000 mg | ORAL_CAPSULE | Freq: Once | ORAL | Status: AC
  Administered 2024-07-20 (×2): 25 mg via ORAL

## 2024-07-20 MED ORDER — ACETAMINOPHEN 325 MG PO TABS
650.0000 mg | ORAL_TABLET | Freq: Once | ORAL | Status: AC
  Administered 2024-07-20 (×2): 650 mg via ORAL

## 2024-07-20 MED ORDER — IRON SUCROSE 20 MG/ML IV SOLN
200.0000 mg | Freq: Once | INTRAVENOUS | Status: AC
  Administered 2024-07-20 (×2): 200 mg via INTRAVENOUS

## 2024-07-20 NOTE — Progress Notes (Signed)
 Diagnosis: Iron  Deficiency Anemia  Provider:  Kizzie Cramp  Procedure: IV Push  IV Type: Peripheral, IV Location: R Antecubital  Venofer  (Iron  Sucrose), Dose: 200 mg  Post Infusion IV Care: Observation period completed and Peripheral IV Discontinued  Discharge: Condition: Good, Destination: Home . AVS Declined  Performed by:  Delon ONEIDA Officer, RN

## 2024-07-22 ENCOUNTER — Encounter (INDEPENDENT_AMBULATORY_CARE_PROVIDER_SITE_OTHER): Admitting: Emergency Medicine

## 2024-07-22 VITALS — BP 100/65 | HR 90 | Temp 97.9°F | Resp 17

## 2024-07-22 DIAGNOSIS — Z3A31 31 weeks gestation of pregnancy: Secondary | ICD-10-CM

## 2024-07-22 DIAGNOSIS — D509 Iron deficiency anemia, unspecified: Secondary | ICD-10-CM | POA: Diagnosis not present

## 2024-07-22 DIAGNOSIS — Z3480 Encounter for supervision of other normal pregnancy, unspecified trimester: Secondary | ICD-10-CM

## 2024-07-22 DIAGNOSIS — O99013 Anemia complicating pregnancy, third trimester: Secondary | ICD-10-CM | POA: Diagnosis not present

## 2024-07-22 MED ORDER — DIPHENHYDRAMINE HCL 25 MG PO CAPS
25.0000 mg | ORAL_CAPSULE | Freq: Once | ORAL | Status: AC
  Administered 2024-07-22: 25 mg via ORAL

## 2024-07-22 MED ORDER — ACETAMINOPHEN 325 MG PO TABS
650.0000 mg | ORAL_TABLET | Freq: Once | ORAL | Status: AC
  Administered 2024-07-22: 650 mg via ORAL

## 2024-07-22 MED ORDER — IRON SUCROSE 20 MG/ML IV SOLN
200.0000 mg | Freq: Once | INTRAVENOUS | Status: AC
  Administered 2024-07-22: 200 mg via INTRAVENOUS

## 2024-07-22 NOTE — Progress Notes (Signed)
 Diagnosis: Iron  Deficiency Anemia  Provider:  Kizzie Cramp  Procedure: IV Push  IV Type: Peripheral, IV Location: R Antecubital  Venofer  (Iron  Sucrose), Dose: 200 mg  Post Infusion IV Care: Observation period completed and Peripheral IV Discontinued  Discharge: Condition: Good, Destination: Home . AVS Declined  Performed by:  Delon ONEIDA Officer, RN

## 2024-07-26 ENCOUNTER — Ambulatory Visit: Admitting: Women's Health

## 2024-07-26 ENCOUNTER — Encounter: Payer: Self-pay | Admitting: Women's Health

## 2024-07-26 VITALS — BP 118/75 | HR 93 | Wt 201.0 lb

## 2024-07-26 DIAGNOSIS — Z3A32 32 weeks gestation of pregnancy: Secondary | ICD-10-CM

## 2024-07-26 DIAGNOSIS — Z3483 Encounter for supervision of other normal pregnancy, third trimester: Secondary | ICD-10-CM | POA: Diagnosis not present

## 2024-07-26 NOTE — Progress Notes (Signed)
 LOW-RISK PREGNANCY VISIT Patient name: Bailey Palmer MRN [id number]  Date of birth: 1996/09/15 Chief Complaint:   Routine Prenatal Visit  History of Present Illness:   Bailey Palmer is a 28 y.o. G55P3003 female at [redacted]w[redacted]d with an Estimated Date of Delivery: 09/17/24 being seen today for ongoing management of a low-risk pregnancy.   Today she reports pelvic pressure. Contractions: Not present. Vag. Bleeding: None.  Movement: Present. denies leaking of fluid.     07/22/2024    2:22 PM 07/15/2024    1:56 PM 07/13/2024    2:50 PM 07/08/2024    8:59 AM 06/28/2024    9:03 AM  Depression screen PHQ 2/9  Decreased Interest 0 0 0 0 0  Down, Depressed, Hopeless 0 0 0 0 0  PHQ - 2 Score 0 0 0 0 0  Altered sleeping    0 0  Tired, decreased energy    0 0  Change in appetite     0  Feeling bad or failure about yourself     0 0  Trouble concentrating    0 0  Moving slowly or fidgety/restless    0 0  Suicidal thoughts    0 0  PHQ-9 Score    0 0  Difficult doing work/chores    Not difficult at all         03/08/2024    8:36 AM 02/03/2023    2:26 PM 04/17/2021    8:48 AM  GAD 7 : Generalized Anxiety Score  Nervous, Anxious, on Edge 0 0 0  Control/stop worrying 0 0 0  Worry too much - different things 0 0 0  Trouble relaxing 0 0 0  Restless 0 0 0  Easily annoyed or irritable 0 0 0  Afraid - awful might happen 0 0 0  Total GAD 7 Score 0 0 0      Review of Systems:   Pertinent items are noted in HPI Denies abnormal vaginal discharge w/ itching/odor/irritation, headaches, visual changes, shortness of breath, chest pain, abdominal pain, severe nausea/vomiting, or problems with urination or bowel movements unless otherwise stated above. Pertinent History Reviewed:  Reviewed past medical,surgical, social, obstetrical and family history.  Reviewed problem list, medications and allergies. Physical Assessment:   Vitals:   07/26/24 0951  BP: 118/75  Pulse: 93  Weight: 201 lb (91.2 kg)   Body mass index is 35.61 kg/m.        Physical Examination:   General appearance: Well appearing, and in no distress  Mental status: Alert, oriented to person, place, and time  Skin: Warm & dry  Cardiovascular: Normal heart rate noted  Respiratory: Normal respiratory effort, no distress  Abdomen: Soft, gravid, nontender  Pelvic: Cervical exam deferred         Extremities:    Fetal Status: Fetal Heart Rate (bpm): 150 Fundal Height: 32 cm Movement: Present    Chaperone: N/A No results found for this or any previous visit (from the past 24 hours).  Assessment & Plan:  1) Low-risk pregnancy G4P3003 at [redacted]w[redacted]d with an Estimated Date of Delivery: 09/17/24   2) Pressure, discussed  3) H/O LGA x1> no DM/dystocia, consider EFW if s>d   Meds: No orders of the defined types were placed in this encounter.  Labs/procedures today: none  Plan:  Continue routine obstetrical care  Next visit: prefers in person    Reviewed: Preterm labor symptoms and general obstetric precautions including but not limited to vaginal bleeding, contractions,  leaking of fluid and fetal movement were reviewed in detail with the patient.  All questions were answered. Does have home bp cuff. Office bp cuff given: not applicable. Check bp weekly, let us  know if consistently >140 and/or >90.  Follow-up: Return in about 2 weeks (around 08/09/2024) for LROB, CNM, in person.  No future appointments.  No orders of the defined types were placed in this encounter.  Suzen JONELLE Fetters CNM, [facility] 07/26/2024 10:08 AM

## 2024-07-26 NOTE — Patient Instructions (Signed)
Bailey Palmer, thank you for choosing our office today! We appreciate the opportunity to meet your healthcare needs. You may receive a short survey by mail, e-mail, or through [employer]. If you are happy with your care we would appreciate if you could take just a few minutes to complete the survey questions. We read all of your comments and take your feedback very seriously. Thank you again for choosing our office.  Center for [employer] Team at [facility]  [facility] at [facility] ([address] [place], [place] [address]) Entrance C, located off of [address] Free 24/7 valet parking   CLASSES: Go to [email or web address] to register for classes (childbirth, breastfeeding, waterbirth, infant CPR, daddy bootcamp, etc.)  Call the office [phone]) or go to [facility] if: You begin to have strong, frequent contractions Your water breaks.  Sometimes it is a big gush of fluid, sometimes it is just a trickle that keeps getting your panties wet or running down your legs You have vaginal bleeding.  It is normal to have a small amount of spotting if your cervix was checked.  You don't feel your baby moving like normal.  If you don't, get you something to eat and drink and lay down and focus on feeling your baby move.   If your baby is still not moving like normal, you should call the office or go to [facility].  Call the office [phone]) or go to [facility] hospital for these signs of pre-eclampsia: Severe headache that does not go away with Tylenol Visual changes- seeing spots, double, blurred vision Pain under your right breast or upper abdomen that does not go away with Tums or heartburn medicine Nausea and/or vomiting Severe swelling in your hands, feet, and face   Tdap Vaccine It is recommended that you get the Tdap vaccine during the third trimester of EACH pregnancy to help protect your baby from getting pertussis (whooping cough) 27-36 weeks is the BEST time to do  this so that you can pass the protection on to your baby. During pregnancy is better than after pregnancy, but if you are unable to get it during pregnancy it will be offered at the hospital.  You can get this vaccine with [place], at the health department, your family doctor, or some local pharmacies Everyone who will be around your baby should also be up-to-date on their vaccines before the baby comes. Adults (who are not pregnant) only need 1 dose of Tdap during adulthood.   [facility] Pediatricians/Family Doctors [facility]): [address] Colette Ribas, [phone]           [facility]: [address], [phone]                [facility]): [address], [phone] (call to ask if accepting patients) [facility] Department: [address], [place], [phone]    [facility] Pediatricians/Family Doctors [facility]): [phone] [address], [phone] [facility]: [address] [place], [phone] [facility] [place]: [address], [phone]  [facility] Doctors  [facility]): [phone] [facility]: [address] [phone]   [facility] Doctors [facility]: [address], [phone]  [facility] Family Doctors  [facility]: [phone], [phone]  Home Blood Pressure Monitoring for Patients   Your provider has recommended that you check your  blood pressure (BP) at least once a week at home. If you do not have a blood pressure cuff at home, one will be provided for you. Contact your provider if you have not received your monitor within 1 week.   Helpful Tips for Accurate Home Blood Pressure Checks  Don't smoke, exercise, or drink caffeine 30 minutes before checking your BP Use the restroom before checking your BP (a full bladder can raise your  pressure) Relax in a comfortable upright chair Feet on the ground Left arm resting comfortably on a flat surface at the level of your heart Legs uncrossed Back supported Sit quietly and don't talk Place the cuff on your bare arm Adjust snuggly, so that only two fingertips can fit between your skin and the top of the cuff Check 2 readings separated by at least one minute Keep a log of your BP readings For a visual, please reference this diagram: http://ccnc.care/bpdiagram  Provider Name: [facility]     Phone: [phone]  Zone 1: ALL CLEAR  Continue to monitor your symptoms:  BP reading is less than 140 (top number) or less than 90 (bottom number)  No right upper stomach pain No headaches or seeing spots No feeling nauseated or throwing up No swelling in face and hands  Zone 2: CAUTION Call your doctor's office for any of the following:  BP reading is greater than 140 (top number) or greater than 90 (bottom number)  Stomach pain under your ribs in the middle or right side Headaches or seeing spots Feeling nauseated or throwing up Swelling in face and hands  Zone 3: EMERGENCY  Seek immediate medical care if you have any of the following:  BP reading is greater than160 (top number) or greater than 110 (bottom number) Severe headaches not improving with Tylenol Serious difficulty catching your breath Any worsening symptoms from Zone 2  Preterm Labor and Birth Information  The normal length of a pregnancy is 39-41 weeks. Preterm labor is when labor starts before 37 completed weeks of pregnancy. What are the risk factors for preterm labor? Preterm labor is more likely to occur in women who: Have certain infections during pregnancy such as a bladder infection, sexually transmitted infection, or infection inside the uterus (chorioamnionitis). Have a shorter-than-normal cervix. Have gone into preterm labor before. Have had surgery on their cervix. Are younger than age 17  or older than age 35. Are African American. Are pregnant with twins or multiple babies (multiple gestation). Take street drugs or smoke while pregnant. Do not gain enough weight while pregnant. Became pregnant shortly after having been pregnant. What are the symptoms of preterm labor? Symptoms of preterm labor include: Cramps similar to those that can happen during a menstrual period. The cramps may happen with diarrhea. Pain in the abdomen or lower back. Regular uterine contractions that may feel like tightening of the abdomen. A feeling of increased pressure in the pelvis. Increased watery or bloody mucus discharge from the vagina. Water breaking (ruptured amniotic sac). Why is it important to recognize signs of preterm labor? It is important to recognize signs of preterm labor because babies who are born prematurely may not be fully developed. This can put them at an increased risk for: Long-term (chronic) heart and lung problems. Difficulty immediately after birth with regulating body systems, including blood sugar, body temperature, heart rate, and breathing rate. Bleeding in the brain. Cerebral palsy. Learning difficulties. Death. These risks are highest for babies who are born before 34 weeks   of pregnancy. How is preterm labor treated? Treatment depends on the length of your pregnancy, your condition, and the health of your baby. It may involve: Having a stitch (suture) placed in your cervix to prevent your cervix from opening too early (cerclage). Taking or being given medicines, such as: Hormone medicines. These may be given early in pregnancy to help support the pregnancy. Medicine to stop contractions. Medicines to help mature the baby's lungs. These may be prescribed if the risk of delivery is high. Medicines to prevent your baby from developing cerebral palsy. If the labor happens before 34 weeks of pregnancy, you may need to stay in the hospital. What should I do if I  think I am in preterm labor? If you think that you are going into preterm labor, call your health care provider right away. How can I prevent preterm labor in future pregnancies? To increase your chance of having a full-term pregnancy: Do not use any tobacco products, such as cigarettes, chewing tobacco, and e-cigarettes. If you need help quitting, ask your health care provider. Do not use street drugs or medicines that have not been prescribed to you during your pregnancy. Talk with your health care provider before taking any herbal supplements, even if you have been taking them regularly. Make sure you gain a healthy amount of weight during your pregnancy. Watch for infection. If you think that you might have an infection, get it checked right away. Make sure to tell your health care provider if you have gone into preterm labor before. This information is not intended to replace advice given to you by your health care provider. Make sure you discuss any questions you have with your health care provider. Document Revised: 03/18/2019 Document Reviewed: 04/16/2016 Elsevier Patient Education  2020 Elsevier Inc.   

## 2024-08-10 ENCOUNTER — Ambulatory Visit (INDEPENDENT_AMBULATORY_CARE_PROVIDER_SITE_OTHER): Admitting: Women's Health

## 2024-08-10 ENCOUNTER — Encounter: Payer: Self-pay | Admitting: Women's Health

## 2024-08-10 VITALS — BP 111/73 | HR 80 | Wt 199.8 lb

## 2024-08-10 DIAGNOSIS — Z3A34 34 weeks gestation of pregnancy: Secondary | ICD-10-CM | POA: Diagnosis not present

## 2024-08-10 DIAGNOSIS — O99013 Anemia complicating pregnancy, third trimester: Secondary | ICD-10-CM

## 2024-08-10 DIAGNOSIS — Z3480 Encounter for supervision of other normal pregnancy, unspecified trimester: Secondary | ICD-10-CM

## 2024-08-10 DIAGNOSIS — Z3483 Encounter for supervision of other normal pregnancy, third trimester: Secondary | ICD-10-CM

## 2024-08-10 NOTE — Patient Instructions (Signed)
Bailey Palmer, thank you for choosing our office today! We appreciate the opportunity to meet your healthcare needs. You may receive a short survey by mail, e-mail, or through [employer]. If you are happy with your care we would appreciate if you could take just a few minutes to complete the survey questions. We read all of your comments and take your feedback very seriously. Thank you again for choosing our office.  Center for [employer] Team at [facility]  [facility] at [facility] ([address] [place], [place] [address]) Entrance C, located off of [address] Free 24/7 valet parking   CLASSES: Go to [email or web address] to register for classes (childbirth, breastfeeding, waterbirth, infant CPR, daddy bootcamp, etc.)  Call the office [phone]) or go to [facility] if: You begin to have strong, frequent contractions Your water breaks.  Sometimes it is a big gush of fluid, sometimes it is just a trickle that keeps getting your panties wet or running down your legs You have vaginal bleeding.  It is normal to have a small amount of spotting if your cervix was checked.  You don't feel your baby moving like normal.  If you don't, get you something to eat and drink and lay down and focus on feeling your baby move.   If your baby is still not moving like normal, you should call the office or go to [facility].  Call the office [phone]) or go to [facility] hospital for these signs of pre-eclampsia: Severe headache that does not go away with Tylenol Visual changes- seeing spots, double, blurred vision Pain under your right breast or upper abdomen that does not go away with Tums or heartburn medicine Nausea and/or vomiting Severe swelling in your hands, feet, and face   Tdap Vaccine It is recommended that you get the Tdap vaccine during the third trimester of EACH pregnancy to help protect your baby from getting pertussis (whooping cough) 27-36 weeks is the BEST time to do  this so that you can pass the protection on to your baby. During pregnancy is better than after pregnancy, but if you are unable to get it during pregnancy it will be offered at the hospital.  You can get this vaccine with [place], at the health department, your family doctor, or some local pharmacies Everyone who will be around your baby should also be up-to-date on their vaccines before the baby comes. Adults (who are not pregnant) only need 1 dose of Tdap during adulthood.   [facility] Pediatricians/Family Doctors [facility]): [address] Colette Ribas, [phone]           [facility]: [address], [phone]                [facility]): [address], [phone] (call to ask if accepting patients) [facility] Department: [address], [place], [phone]    [facility] Pediatricians/Family Doctors [facility]): [phone] [address], [phone] [facility]: [address] [place], [phone] [facility] [place]: [address], [phone]  [facility] Doctors  [facility]): [phone] [facility]: [address] [phone]   [facility] Doctors [facility]: [address], [phone]  [facility] Family Doctors  [facility]: [phone], [phone]  Home Blood Pressure Monitoring for Patients   Your provider has recommended that you check your  blood pressure (BP) at least once a week at home. If you do not have a blood pressure cuff at home, one will be provided for you. Contact your provider if you have not received your monitor within 1 week.   Helpful Tips for Accurate Home Blood Pressure Checks  Don't smoke, exercise, or drink caffeine 30 minutes before checking your BP Use the restroom before checking your BP (a full bladder can raise your  pressure) Relax in a comfortable upright chair Feet on the ground Left arm resting comfortably on a flat surface at the level of your heart Legs uncrossed Back supported Sit quietly and don't talk Place the cuff on your bare arm Adjust snuggly, so that only two fingertips can fit between your skin and the top of the cuff Check 2 readings separated by at least one minute Keep a log of your BP readings For a visual, please reference this diagram: http://ccnc.care/bpdiagram  Provider Name: [facility]     Phone: [phone]  Zone 1: ALL CLEAR  Continue to monitor your symptoms:  BP reading is less than 140 (top number) or less than 90 (bottom number)  No right upper stomach pain No headaches or seeing spots No feeling nauseated or throwing up No swelling in face and hands  Zone 2: CAUTION Call your doctor's office for any of the following:  BP reading is greater than 140 (top number) or greater than 90 (bottom number)  Stomach pain under your ribs in the middle or right side Headaches or seeing spots Feeling nauseated or throwing up Swelling in face and hands  Zone 3: EMERGENCY  Seek immediate medical care if you have any of the following:  BP reading is greater than160 (top number) or greater than 110 (bottom number) Severe headaches not improving with Tylenol Serious difficulty catching your breath Any worsening symptoms from Zone 2  Preterm Labor and Birth Information  The normal length of a pregnancy is 39-41 weeks. Preterm labor is when labor starts before 37 completed weeks of pregnancy. What are the risk factors for preterm labor? Preterm labor is more likely to occur in women who: Have certain infections during pregnancy such as a bladder infection, sexually transmitted infection, or infection inside the uterus (chorioamnionitis). Have a shorter-than-normal cervix. Have gone into preterm labor before. Have had surgery on their cervix. Are younger than age 17  or older than age 35. Are African American. Are pregnant with twins or multiple babies (multiple gestation). Take street drugs or smoke while pregnant. Do not gain enough weight while pregnant. Became pregnant shortly after having been pregnant. What are the symptoms of preterm labor? Symptoms of preterm labor include: Cramps similar to those that can happen during a menstrual period. The cramps may happen with diarrhea. Pain in the abdomen or lower back. Regular uterine contractions that may feel like tightening of the abdomen. A feeling of increased pressure in the pelvis. Increased watery or bloody mucus discharge from the vagina. Water breaking (ruptured amniotic sac). Why is it important to recognize signs of preterm labor? It is important to recognize signs of preterm labor because babies who are born prematurely may not be fully developed. This can put them at an increased risk for: Long-term (chronic) heart and lung problems. Difficulty immediately after birth with regulating body systems, including blood sugar, body temperature, heart rate, and breathing rate. Bleeding in the brain. Cerebral palsy. Learning difficulties. Death. These risks are highest for babies who are born before 34 weeks   of pregnancy. How is preterm labor treated? Treatment depends on the length of your pregnancy, your condition, and the health of your baby. It may involve: Having a stitch (suture) placed in your cervix to prevent your cervix from opening too early (cerclage). Taking or being given medicines, such as: Hormone medicines. These may be given early in pregnancy to help support the pregnancy. Medicine to stop contractions. Medicines to help mature the baby's lungs. These may be prescribed if the risk of delivery is high. Medicines to prevent your baby from developing cerebral palsy. If the labor happens before 34 weeks of pregnancy, you may need to stay in the hospital. What should I do if I  think I am in preterm labor? If you think that you are going into preterm labor, call your health care provider right away. How can I prevent preterm labor in future pregnancies? To increase your chance of having a full-term pregnancy: Do not use any tobacco products, such as cigarettes, chewing tobacco, and e-cigarettes. If you need help quitting, ask your health care provider. Do not use street drugs or medicines that have not been prescribed to you during your pregnancy. Talk with your health care provider before taking any herbal supplements, even if you have been taking them regularly. Make sure you gain a healthy amount of weight during your pregnancy. Watch for infection. If you think that you might have an infection, get it checked right away. Make sure to tell your health care provider if you have gone into preterm labor before. This information is not intended to replace advice given to you by your health care provider. Make sure you discuss any questions you have with your health care provider. Document Revised: 03/18/2019 Document Reviewed: 04/16/2016 Elsevier Patient Education  2020 Elsevier Inc.   

## 2024-08-10 NOTE — Progress Notes (Signed)
 LOW-RISK PREGNANCY VISIT Patient name: Bailey Palmer MRN [id number]  Date of birth: 12-Jun-1996 Chief Complaint:   Routine Prenatal Visit  History of Present Illness:   Bailey Palmer is a 28 y.o. G74P3003 female at [redacted]w[redacted]d with an Estimated Date of Delivery: 09/17/24 being seen today for ongoing management of a low-risk pregnancy.   Today she reports pelvic pressure. Contractions: Irregular.  .  Movement: Present. denies leaking of fluid.     07/22/2024    2:22 PM 07/15/2024    1:56 PM 07/13/2024    2:50 PM 07/08/2024    8:59 AM 06/28/2024    9:03 AM  Depression screen PHQ 2/9  Decreased Interest 0 0 0 0 0  Down, Depressed, Hopeless 0 0 0 0 0  PHQ - 2 Score 0 0 0 0 0  Altered sleeping    0 0  Tired, decreased energy    0 0  Change in appetite     0  Feeling bad or failure about yourself     0 0  Trouble concentrating    0 0  Moving slowly or fidgety/restless    0 0  Suicidal thoughts    0 0  PHQ-9 Score    0 0  Difficult doing work/chores    Not difficult at all         03/08/2024    8:36 AM 02/03/2023    2:26 PM 04/17/2021    8:48 AM  GAD 7 : Generalized Anxiety Score  Nervous, Anxious, on Edge 0 0 0  Control/stop worrying 0 0 0  Worry too much - different things 0 0 0  Trouble relaxing 0 0 0  Restless 0 0 0  Easily annoyed or irritable 0 0 0  Afraid - awful might happen 0 0 0  Total GAD 7 Score 0 0 0      Review of Systems:   Pertinent items are noted in HPI Denies abnormal vaginal discharge w/ itching/odor/irritation, headaches, visual changes, shortness of breath, chest pain, abdominal pain, severe nausea/vomiting, or problems with urination or bowel movements unless otherwise stated above. Pertinent History Reviewed:  Reviewed past medical,surgical, social, obstetrical and family history.  Reviewed problem list, medications and allergies. Physical Assessment:   Vitals:   08/10/24 1100  BP: 111/73  Pulse: 80  Weight: 199 lb 12.8 oz (90.6 kg)  Body mass  index is 35.39 kg/m.        Physical Examination:   General appearance: Well appearing, and in no distress  Mental status: Alert, oriented to person, place, and time  Skin: Warm & dry  Cardiovascular: Normal heart rate noted  Respiratory: Normal respiratory effort, no distress  Abdomen: Soft, gravid, nontender  Pelvic: Cervical exam deferred         Extremities: Edema: None  Fetal Status: Fetal Heart Rate (bpm): 137 Fundal Height: 34 cm Movement: Present    Chaperone: N/A No results found for this or any previous visit (from the past 24 hours).  Assessment & Plan:  1) Low-risk pregnancy G4P3003 at [redacted]w[redacted]d with an Estimated Date of Delivery: 09/17/24   2) Pressure, discussed belt/tape  3) Anemia> s/p IV Fe, repeat hgb next visit   Meds: No orders of the defined types were placed in this encounter.  Labs/procedures today: none  Plan:  Continue routine obstetrical care  Next visit: prefers will be in person for cultures    Reviewed: Preterm labor symptoms and general obstetric precautions including but not limited  to vaginal bleeding, contractions, leaking of fluid and fetal movement were reviewed in detail with the patient.  All questions were answered. Does have home bp cuff. Office bp cuff given: not applicable. Check bp weekly, let us  know if consistently >140 and/or >90.  Follow-up: Return for As scheduled.  Future Appointments  Date Time Provider Department Center  08/23/2024 10:10 AM Kizzie Suzen SAUNDERS, CNM [facility]  08/30/2024  9:50 AM Kizzie Suzen SAUNDERS, CNM [facility]  09/06/2024 10:10 AM Kizzie Suzen SAUNDERS, CNM [facility]  09/13/2024  9:50 AM Kizzie Suzen SAUNDERS, CNM [facility]    No orders of the defined types were placed in this encounter.  Suzen SAUNDERS Kizzie CNM, [facility] 08/10/2024 11:18 AM

## 2024-08-23 ENCOUNTER — Encounter: Admitting: Women's Health

## 2024-08-24 ENCOUNTER — Other Ambulatory Visit (HOSPITAL_COMMUNITY)
Admission: RE | Admit: 2024-08-24 | Discharge: 2024-08-24 | Disposition: A | Discharge status: Home / Self Care | Source: Ambulatory Visit | Attending: Women's Health | Admitting: Women's Health

## 2024-08-24 ENCOUNTER — Encounter: Payer: Self-pay | Admitting: Women's Health

## 2024-08-24 ENCOUNTER — Encounter: Admitting: Women's Health

## 2024-08-24 ENCOUNTER — Ambulatory Visit: Admitting: Women's Health

## 2024-08-24 VITALS — BP 104/73 | HR 80 | Wt 202.0 lb

## 2024-08-24 DIAGNOSIS — Z113 Encounter for screening for infections with a predominantly sexual mode of transmission: Secondary | ICD-10-CM | POA: Diagnosis not present

## 2024-08-24 DIAGNOSIS — Z3483 Encounter for supervision of other normal pregnancy, third trimester: Secondary | ICD-10-CM

## 2024-08-24 DIAGNOSIS — Z3A36 36 weeks gestation of pregnancy: Secondary | ICD-10-CM

## 2024-08-24 DIAGNOSIS — O99013 Anemia complicating pregnancy, third trimester: Secondary | ICD-10-CM

## 2024-08-24 DIAGNOSIS — D649 Anemia, unspecified: Secondary | ICD-10-CM

## 2024-08-24 DIAGNOSIS — Z3493 Encounter for supervision of normal pregnancy, unspecified, third trimester: Secondary | ICD-10-CM | POA: Diagnosis not present

## 2024-08-24 DIAGNOSIS — Z3480 Encounter for supervision of other normal pregnancy, unspecified trimester: Secondary | ICD-10-CM

## 2024-08-24 LAB — POCT HEMOGLOBIN: Hemoglobin: 9.9 g/dL — AB (ref 11–14.6)

## 2024-08-24 NOTE — Progress Notes (Signed)
 LOW-RISK PREGNANCY VISIT Patient name: Bailey Palmer MRN [id number]  Date of birth: 08-15-1996 Chief Complaint:   Routine Prenatal Visit (culture)  History of Present Illness:   Bailey Palmer is a 28 y.o. [phone] female at [redacted]w[redacted]d with an Estimated Date of Delivery: 09/17/24 being seen today for ongoing management of a low-risk pregnancy.   Today she reports no complaints. Contractions: Irregular.  .  Movement: Present. denies leaking of fluid.     07/22/2024    2:22 PM 07/15/2024    1:56 PM 07/13/2024    2:50 PM 07/08/2024    8:59 AM 06/28/2024    9:03 AM  Depression screen PHQ 2/9  Decreased Interest 0 0 0 0 0  Down, Depressed, Hopeless 0 0 0 0 0  PHQ - 2 Score 0 0 0 0 0  Altered sleeping    0 0  Tired, decreased energy    0 0  Change in appetite     0  Feeling bad or failure about yourself     0 0  Trouble concentrating    0 0  Moving slowly or fidgety/restless    0 0  Suicidal thoughts    0 0  PHQ-9 Score    0 0  Difficult doing work/chores    Not difficult at all         03/08/2024    8:36 AM 02/03/2023    2:26 PM 04/17/2021    8:48 AM  GAD 7 : Generalized Anxiety Score  Nervous, Anxious, on Edge 0 0 0  Control/stop worrying 0 0 0  Worry too much - different things 0 0 0  Trouble relaxing 0 0 0  Restless 0 0 0  Easily annoyed or irritable 0 0 0  Afraid - awful might happen 0 0 0  Total GAD 7 Score 0 0 0      Review of Systems:   Pertinent items are noted in HPI Denies abnormal vaginal discharge w/ itching/odor/irritation, headaches, visual changes, shortness of breath, chest pain, abdominal pain, severe nausea/vomiting, or problems with urination or bowel movements unless otherwise stated above. Pertinent History Reviewed:  Reviewed past medical,surgical, social, obstetrical and family history.  Reviewed problem list, medications and allergies. Physical Assessment:   Vitals:   08/24/24 1216  BP: 104/73  Pulse: 80  Weight: 202 lb (91.6 kg)  Body mass  index is 35.78 kg/m.        Physical Examination:   General appearance: Well appearing, and in no distress  Mental status: Alert, oriented to person, place, and time  Skin: Warm & dry  Cardiovascular: Normal heart rate noted  Respiratory: Normal respiratory effort, no distress  Abdomen: Soft, gravid, nontender  Pelvic: Cervical exam performed  Dilation: 1 Effacement (%): Thick Station: -3  Extremities: Edema: Trace  Fetal Status: Fetal Heart Rate (bpm): 132 Fundal Height: 36 cm Movement: Present Presentation: Vertex  Chaperone: Peggy Dones Results for orders placed or performed in visit on 08/24/24 (from the past 24 hours)  POCT hemoglobin   Collection Time: 08/24/24  1:14 PM  Result Value Ref Range   Hemoglobin 9.9 (A) 11 - 14.6 g/dL    Assessment & Plan:  1) Low-risk pregnancy G4P3003 at [redacted]w[redacted]d with an Estimated Date of Delivery: 09/17/24   2) Anemia, s/p IV Fe, fingerstick hgb today 9.9, continue po Fe   Meds: No orders of the defined types were placed in this encounter.  Labs/procedures today: GBS, GC/CT, SVE, and fingerstick hgb  Plan:  Continue routine obstetrical care  Next visit: prefers in person    Reviewed: Preterm labor symptoms and general obstetric precautions including but not limited to vaginal bleeding, contractions, leaking of fluid and fetal movement were reviewed in detail with the patient.  All questions were answered. Does have home bp cuff. Office bp cuff given: not applicable. Check bp weekly, let us  know if consistently >140 and/or >90.  Follow-up: Return for As scheduled.  Future Appointments  Date Time Provider Department Center  08/30/2024  1:50 PM Kizzie Suzen SAUNDERS, [place] [facility]  09/07/2024 10:10 AM Kizzie Suzen SAUNDERS, CNM [facility]  09/13/2024  9:50 AM Kizzie Suzen SAUNDERS, CNM [facility]    Orders Placed This Encounter  Procedures   Culture, beta strep (group b only)   POCT hemoglobin   Suzen SAUNDERS Kizzie CNM,  [facility] 08/24/2024 1:51 PM

## 2024-08-24 NOTE — Patient Instructions (Signed)
Bailey Palmer, thank you for choosing our office today! We appreciate the opportunity to meet your healthcare needs. You may receive a short survey by mail, e-mail, or through [employer]. If you are happy with your care we would appreciate if you could take just a few minutes to complete the survey questions. We read all of your comments and take your feedback very seriously. Thank you again for choosing our office.  Center for [employer] Team at [facility]  [facility] at [facility] ([address] [place], [place] [address]) Entrance C, located off of [address] Free 24/7 valet parking   CLASSES: Go to [email or web address] to register for classes (childbirth, breastfeeding, waterbirth, infant CPR, daddy bootcamp, etc.)  Call the office [phone]) or go to [facility] if: You begin to have strong, frequent contractions Your water breaks.  Sometimes it is a big gush of fluid, sometimes it is just a trickle that keeps getting your panties wet or running down your legs You have vaginal bleeding.  It is normal to have a small amount of spotting if your cervix was checked.  You don't feel your baby moving like normal.  If you don't, get you something to eat and drink and lay down and focus on feeling your baby move.   If your baby is still not moving like normal, you should call the office or go to [facility].  Call the office [phone]) or go to [facility] hospital for these signs of pre-eclampsia: Severe headache that does not go away with Tylenol Visual changes- seeing spots, double, blurred vision Pain under your right breast or upper abdomen that does not go away with Tums or heartburn medicine Nausea and/or vomiting Severe swelling in your hands, feet, and face   Tdap Vaccine It is recommended that you get the Tdap vaccine during the third trimester of EACH pregnancy to help protect your baby from getting pertussis (whooping cough) 27-36 weeks is the BEST time to do  this so that you can pass the protection on to your baby. During pregnancy is better than after pregnancy, but if you are unable to get it during pregnancy it will be offered at the hospital.  You can get this vaccine with [place], at the health department, your family doctor, or some local pharmacies Everyone who will be around your baby should also be up-to-date on their vaccines before the baby comes. Adults (who are not pregnant) only need 1 dose of Tdap during adulthood.   [facility] Pediatricians/Family Doctors [facility]): [address] Colette Ribas, [phone]           [facility]: [address], [phone]                [facility]): [address], [phone] (call to ask if accepting patients) [facility] Department: [address], [place], [phone]    [facility] Pediatricians/Family Doctors [facility]): [phone] [address], [phone] [facility]: [address] [place], [phone] [facility] [place]: [address], [phone]  [facility] Doctors  [facility]): [phone] [facility]: [address] [phone]   [facility] Doctors [facility]: [address], [phone]  [facility] Family Doctors  [facility]: [phone], [phone]  Home Blood Pressure Monitoring for Patients   Your provider has recommended that you check your  blood pressure (BP) at least once a week at home. If you do not have a blood pressure cuff at home, one will be provided for you. Contact your provider if you have not received your monitor within 1 week.   Helpful Tips for Accurate Home Blood Pressure Checks  Don't smoke, exercise, or drink caffeine 30 minutes before checking your BP Use the restroom before checking your BP (a full bladder can raise your  pressure) Relax in a comfortable upright chair Feet on the ground Left arm resting comfortably on a flat surface at the level of your heart Legs uncrossed Back supported Sit quietly and don't talk Place the cuff on your bare arm Adjust snuggly, so that only two fingertips can fit between your skin and the top of the cuff Check 2 readings separated by at least one minute Keep a log of your BP readings For a visual, please reference this diagram: http://ccnc.care/bpdiagram  Provider Name: [facility]     Phone: [phone]  Zone 1: ALL CLEAR  Continue to monitor your symptoms:  BP reading is less than 140 (top number) or less than 90 (bottom number)  No right upper stomach pain No headaches or seeing spots No feeling nauseated or throwing up No swelling in face and hands  Zone 2: CAUTION Call your doctor's office for any of the following:  BP reading is greater than 140 (top number) or greater than 90 (bottom number)  Stomach pain under your ribs in the middle or right side Headaches or seeing spots Feeling nauseated or throwing up Swelling in face and hands  Zone 3: EMERGENCY  Seek immediate medical care if you have any of the following:  BP reading is greater than160 (top number) or greater than 110 (bottom number) Severe headaches not improving with Tylenol Serious difficulty catching your breath Any worsening symptoms from Zone 2  Preterm Labor and Birth Information  The normal length of a pregnancy is 39-41 weeks. Preterm labor is when labor starts before 37 completed weeks of pregnancy. What are the risk factors for preterm labor? Preterm labor is more likely to occur in women who: Have certain infections during pregnancy such as a bladder infection, sexually transmitted infection, or infection inside the uterus (chorioamnionitis). Have a shorter-than-normal cervix. Have gone into preterm labor before. Have had surgery on their cervix. Are younger than age 17  or older than age 35. Are African American. Are pregnant with twins or multiple babies (multiple gestation). Take street drugs or smoke while pregnant. Do not gain enough weight while pregnant. Became pregnant shortly after having been pregnant. What are the symptoms of preterm labor? Symptoms of preterm labor include: Cramps similar to those that can happen during a menstrual period. The cramps may happen with diarrhea. Pain in the abdomen or lower back. Regular uterine contractions that may feel like tightening of the abdomen. A feeling of increased pressure in the pelvis. Increased watery or bloody mucus discharge from the vagina. Water breaking (ruptured amniotic sac). Why is it important to recognize signs of preterm labor? It is important to recognize signs of preterm labor because babies who are born prematurely may not be fully developed. This can put them at an increased risk for: Long-term (chronic) heart and lung problems. Difficulty immediately after birth with regulating body systems, including blood sugar, body temperature, heart rate, and breathing rate. Bleeding in the brain. Cerebral palsy. Learning difficulties. Death. These risks are highest for babies who are born before 34 weeks   of pregnancy. How is preterm labor treated? Treatment depends on the length of your pregnancy, your condition, and the health of your baby. It may involve: Having a stitch (suture) placed in your cervix to prevent your cervix from opening too early (cerclage). Taking or being given medicines, such as: Hormone medicines. These may be given early in pregnancy to help support the pregnancy. Medicine to stop contractions. Medicines to help mature the baby's lungs. These may be prescribed if the risk of delivery is high. Medicines to prevent your baby from developing cerebral palsy. If the labor happens before 34 weeks of pregnancy, you may need to stay in the hospital. What should I do if I  think I am in preterm labor? If you think that you are going into preterm labor, call your health care provider right away. How can I prevent preterm labor in future pregnancies? To increase your chance of having a full-term pregnancy: Do not use any tobacco products, such as cigarettes, chewing tobacco, and e-cigarettes. If you need help quitting, ask your health care provider. Do not use street drugs or medicines that have not been prescribed to you during your pregnancy. Talk with your health care provider before taking any herbal supplements, even if you have been taking them regularly. Make sure you gain a healthy amount of weight during your pregnancy. Watch for infection. If you think that you might have an infection, get it checked right away. Make sure to tell your health care provider if you have gone into preterm labor before. This information is not intended to replace advice given to you by your health care provider. Make sure you discuss any questions you have with your health care provider. Document Revised: 03/18/2019 Document Reviewed: 04/16/2016 Elsevier Patient Education  2020 Elsevier Inc.   

## 2024-08-25 LAB — CERVICOVAGINAL ANCILLARY ONLY
Chlamydia: NEGATIVE
Comment: NEGATIVE
Comment: NORMAL
Neisseria Gonorrhea: NEGATIVE

## 2024-08-28 LAB — CULTURE, BETA STREP (GROUP B ONLY)

## 2024-08-30 ENCOUNTER — Ambulatory Visit (INDEPENDENT_AMBULATORY_CARE_PROVIDER_SITE_OTHER): Admitting: Women's Health

## 2024-08-30 ENCOUNTER — Encounter: Admitting: Women's Health

## 2024-08-30 ENCOUNTER — Encounter: Payer: Self-pay | Admitting: Women's Health

## 2024-08-30 VITALS — BP 119/79 | HR 80 | Wt 204.6 lb

## 2024-08-30 DIAGNOSIS — Z3483 Encounter for supervision of other normal pregnancy, third trimester: Secondary | ICD-10-CM

## 2024-08-30 DIAGNOSIS — Z3A37 37 weeks gestation of pregnancy: Secondary | ICD-10-CM

## 2024-08-30 DIAGNOSIS — Z3480 Encounter for supervision of other normal pregnancy, unspecified trimester: Secondary | ICD-10-CM

## 2024-08-30 NOTE — Patient Instructions (Signed)
 Bailey Palmer, thank you for choosing our office today! We appreciate the opportunity to meet your healthcare needs. You may receive a short survey by mail, e-mail, or through [employer]. If you are happy with your care we would appreciate if you could take just a few minutes to complete the survey questions. We read all of your comments and take your feedback very seriously. Thank you again for choosing our office.  Center for [employer] Team at [facility]  [facility] at [facility] ([address] [place], [place] [address]) Entrance C, located off of [address] Free 24/7 valet parking   CLASSES: Go to [email or web address] to register for classes (childbirth, breastfeeding, waterbirth, infant CPR, daddy bootcamp, etc.)  Call the office [phone]) or go to [facility] if: You begin to have strong, frequent contractions Your water breaks.  Sometimes it is a big gush of fluid, sometimes it is just a trickle that keeps getting your panties wet or running down your legs You have vaginal bleeding.  It is normal to have a small amount of spotting if your cervix was checked.  You don't feel your baby moving like normal.  If you don't, get you something to eat and drink and lay down and focus on feeling your baby move.   If your baby is still not moving like normal, you should call the office or go to [facility].  Call the office [phone]) or go to [facility] hospital for these signs of pre-eclampsia: Severe headache that does not go away with Tylenol Visual changes- seeing spots, double, blurred vision Pain under your right breast or upper abdomen that does not go away with Tums or heartburn medicine Nausea and/or vomiting Severe swelling in your hands, feet, and face   [facility] Pediatricians/Family Doctors [facility]): [address] Colette Ribas, [phone]           [facility]: [address], [phone]                 [facility]): [address], [phone] (call to ask if accepting patients) [facility] Department: [address], [place], [phone]    [facility] Pediatricians/Family Doctors [facility]): [address], [phone] [facility]: [address] [place], [phone] [facility]: [address], [phone]  [facility] Doctors  [facility]): [phone] [facility]: [address], [phone]   [facility] Doctors [facility]: [address], [phone]  [facility] Doctors  [facility]: [phone], [phone]  Home Blood Pressure Monitoring for Patients   Your provider has recommended that you check your blood pressure (BP) at least once a week at home. If you do not have a blood pressure cuff at home, one will be provided for you. Contact your provider if you have not received your monitor within 1 week.   Helpful Tips for Accurate Home Blood Pressure Checks  Don't smoke, exercise, or drink caffeine 30 minutes before checking your BP Use the restroom before checking your BP (a full bladder can raise your pressure) Relax in a comfortable upright chair Feet on the ground Left arm resting comfortably on a flat surface at the level of your heart Legs uncrossed Back supported Sit quietly and don't talk Place the cuff on your bare arm Adjust snuggly, so that only two fingertips  can fit between your skin and the top of the cuff Check 2 readings separated by at least one minute Keep a log of your BP readings For a visual, please reference this diagram: http://ccnc.care/bpdiagram  Provider Name: [facility]     Phone: [phone]  Zone 1: ALL CLEAR  Continue to monitor your symptoms:  BP reading is less than 140 (top number) or less than 90 (bottom number)  No right  upper stomach pain No headaches or seeing spots No feeling nauseated or throwing up No swelling in face and hands  Zone 2: CAUTION Call your doctor's office for any of the following:  BP reading is greater than 140 (top number) or greater than 90 (bottom number)  Stomach pain under your ribs in the middle or right side Headaches or seeing spots Feeling nauseated or throwing up Swelling in face and hands  Zone 3: EMERGENCY  Seek immediate medical care if you have any of the following:  BP reading is greater than160 (top number) or greater than 110 (bottom number) Severe headaches not improving with Tylenol Serious difficulty catching your breath Any worsening symptoms from Zone 2   Braxton Hicks Contractions Contractions of the uterus can occur throughout pregnancy, but they are not always a sign that you are in labor. You may have practice contractions called Braxton Hicks contractions. These false labor contractions are sometimes confused with true labor. What are Bailey Palmer contractions? Braxton Hicks contractions are tightening movements that occur in the muscles of the uterus before labor. Unlike true labor contractions, these contractions do not result in opening (dilation) and thinning of the cervix. Toward the end of pregnancy (32-34 weeks), Braxton Hicks contractions can happen more often and may become stronger. These contractions are sometimes difficult to tell apart from true labor because they can be very uncomfortable. You should not feel embarrassed if you go to the hospital with false labor. Sometimes, the only way to tell if you are in true labor is for your health care provider to look for changes in the cervix. The health care provider will do a physical exam and may monitor your contractions. If you are not in true labor, the exam should show that your cervix is not dilating and your water has not broken. If there are no other health problems associated with your  pregnancy, it is completely safe for you to be sent home with false labor. You may continue to have Braxton Hicks contractions until you go into true labor. How to tell the difference between true labor and false labor True labor Contractions last 30-70 seconds. Contractions become very regular. Discomfort is usually felt in the top of the uterus, and it spreads to the lower abdomen and low back. Contractions do not go away with walking. Contractions usually become more intense and increase in frequency. The cervix dilates and gets thinner. False labor Contractions are usually shorter and not as strong as true labor contractions. Contractions are usually irregular. Contractions are often felt in the front of the lower abdomen and in the groin. Contractions may go away when you walk around or change positions while lying down. Contractions get weaker and are shorter-lasting as time goes on. The cervix usually does not dilate or become thin. Follow these instructions at home:  Take over-the-counter and prescription medicines only as told by your health care provider. Keep up with your usual exercises and follow other instructions from your health care provider. Eat and drink lightly if you think  you are going into labor. If Braxton Hicks contractions are making you uncomfortable: Change your position from lying down or resting to walking, or change from walking to resting. Sit and rest in a tub of warm water. Drink enough fluid to keep your urine pale yellow. Dehydration may cause these contractions. Do slow and deep breathing several times an hour. Keep all follow-up prenatal visits as told by your health care provider. This is important. Contact a health care provider if: You have a fever. You have continuous pain in your abdomen. Get help right away if: Your contractions become stronger, more regular, and closer together. You have fluid leaking or gushing from your vagina. You pass  blood-tinged mucus (bloody show). You have bleeding from your vagina. You have low back pain that you never had before. You feel your baby's head pushing down and causing pelvic pressure. Your baby is not moving inside you as much as it used to. Summary Contractions that occur before labor are called Braxton Hicks contractions, false labor, or practice contractions. Braxton Hicks contractions are usually shorter, weaker, farther apart, and less regular than true labor contractions. True labor contractions usually become progressively stronger and regular, and they become more frequent. Manage discomfort from [facility] contractions by changing position, resting in a warm bath, drinking plenty of water, or practicing deep breathing. This information is not intended to replace advice given to you by your health care provider. Make sure you discuss any questions you have with your health care provider. Document Revised: 11/06/2017 Document Reviewed: 04/09/2017 Elsevier Patient Education  2020 [employer].

## 2024-08-30 NOTE — Progress Notes (Signed)
 LOW-RISK PREGNANCY VISIT Patient name: Bailey Palmer MRN [id number]  Date of birth: 25-Jul-1996 Chief Complaint:   Routine Prenatal Visit  History of Present Illness:   Bailey Palmer is a 28 y.o. G69P3003 female at [redacted]w[redacted]d with an Estimated Date of Delivery: 09/17/24 being seen today for ongoing management of a low-risk pregnancy.   Today she reports no complaints. Contractions: Not present.  .  Movement: Present. denies leaking of fluid.     07/22/2024    2:22 PM 07/15/2024    1:56 PM 07/13/2024    2:50 PM 07/08/2024    8:59 AM 06/28/2024    9:03 AM  Depression screen PHQ 2/9  Decreased Interest 0 0 0 0 0  Down, Depressed, Hopeless 0 0 0 0 0  PHQ - 2 Score 0 0 0 0 0  Altered sleeping    0 0  Tired, decreased energy    0 0  Change in appetite     0  Feeling bad or failure about yourself     0 0  Trouble concentrating    0 0  Moving slowly or fidgety/restless    0 0  Suicidal thoughts    0 0  PHQ-9 Score    0 0  Difficult doing work/chores    Not difficult at all         03/08/2024    8:36 AM 02/03/2023    2:26 PM 04/17/2021    8:48 AM  GAD 7 : Generalized Anxiety Score  Nervous, Anxious, on Edge 0 0 0  Control/stop worrying 0 0 0  Worry too much - different things 0 0 0  Trouble relaxing 0 0 0  Restless 0 0 0  Easily annoyed or irritable 0 0 0  Afraid - awful might happen 0 0 0  Total GAD 7 Score 0 0 0      Review of Systems:   Pertinent items are noted in HPI Denies abnormal vaginal discharge w/ itching/odor/irritation, headaches, visual changes, shortness of breath, chest pain, abdominal pain, severe nausea/vomiting, or problems with urination or bowel movements unless otherwise stated above. Pertinent History Reviewed:  Reviewed past medical,surgical, social, obstetrical and family history.  Reviewed problem list, medications and allergies. Physical Assessment:   Vitals:   08/30/24 1356  BP: 119/79  Pulse: 80  Weight: 204 lb 9.6 oz (92.8 kg)  Body mass index  is 36.24 kg/m.        Physical Examination:   General appearance: Well appearing, and in no distress  Mental status: Alert, oriented to person, place, and time  Skin: Warm & dry  Cardiovascular: Normal heart rate noted  Respiratory: Normal respiratory effort, no distress  Abdomen: Soft, gravid, nontender  Pelvic: Cervical exam deferred         Extremities: Edema: None  Fetal Status: Fetal Heart Rate (bpm): 142 Fundal Height: 38 cm Movement: Present    Chaperone: N/A No results found for this or any previous visit (from the past 24 hours).  Assessment & Plan:  1) Low-risk pregnancy G4P3003 at [redacted]w[redacted]d with an Estimated Date of Delivery: 09/17/24   2) Anemia s/p IV Fe, last hgb up to 9.9 (from 8.9)   Meds: No orders of the defined types were placed in this encounter.  Labs/procedures today: none  Plan:  Continue routine obstetrical care  Next visit: prefers in person    Reviewed: Term labor symptoms and general obstetric precautions including but not limited to vaginal bleeding, contractions, leaking  of fluid and fetal movement were reviewed in detail with the patient.  All questions were answered. Does have home bp cuff. Office bp cuff given: not applicable. Check bp weekly, let us  know if consistently >140 and/or >90.  Follow-up: Return for As scheduled.  Future Appointments  Date Time Provider Department Center  09/07/2024 10:10 AM Kizzie Suzen SAUNDERS, CNM [facility]  09/13/2024  9:50 AM Kizzie Suzen SAUNDERS, CNM [facility]    No orders of the defined types were placed in this encounter.  Suzen SAUNDERS Kizzie CNM, [facility] 08/30/2024 2:09 PM

## 2024-08-31 ENCOUNTER — Encounter: Admitting: Women's Health

## 2024-09-06 ENCOUNTER — Encounter: Admitting: Women's Health

## 2024-09-07 ENCOUNTER — Encounter (HOSPITAL_COMMUNITY): Payer: Self-pay | Admitting: Obstetrics & Gynecology

## 2024-09-07 ENCOUNTER — Ambulatory Visit: Admitting: Women's Health

## 2024-09-07 ENCOUNTER — Other Ambulatory Visit: Payer: Self-pay

## 2024-09-07 ENCOUNTER — Inpatient Hospital Stay (HOSPITAL_COMMUNITY): Admitting: Anesthesiology

## 2024-09-07 ENCOUNTER — Encounter: Payer: Self-pay | Admitting: Women's Health

## 2024-09-07 ENCOUNTER — Inpatient Hospital Stay (HOSPITAL_COMMUNITY)
Admission: AD | Admit: 2024-09-07 | Discharge: 2024-09-09 | DRG: 807 | Disposition: A | Discharge status: Home / Self Care | Attending: Obstetrics & Gynecology | Admitting: Obstetrics & Gynecology

## 2024-09-07 VITALS — BP 129/83 | HR 79 | Wt 207.2 lb

## 2024-09-07 DIAGNOSIS — O99214 Obesity complicating childbirth: Secondary | ICD-10-CM | POA: Diagnosis present

## 2024-09-07 DIAGNOSIS — Z833 Family history of diabetes mellitus: Secondary | ICD-10-CM

## 2024-09-07 DIAGNOSIS — O09299 Supervision of pregnancy with other poor reproductive or obstetric history, unspecified trimester: Secondary | ICD-10-CM

## 2024-09-07 DIAGNOSIS — D509 Iron deficiency anemia, unspecified: Secondary | ICD-10-CM | POA: Diagnosis present

## 2024-09-07 DIAGNOSIS — O9902 Anemia complicating childbirth: Secondary | ICD-10-CM | POA: Diagnosis present

## 2024-09-07 DIAGNOSIS — Z8249 Family history of ischemic heart disease and other diseases of the circulatory system: Secondary | ICD-10-CM

## 2024-09-07 DIAGNOSIS — Z3A38 38 weeks gestation of pregnancy: Secondary | ICD-10-CM

## 2024-09-07 DIAGNOSIS — O4290 Premature rupture of membranes, unspecified as to length of time between rupture and onset of labor, unspecified weeks of gestation: Secondary | ICD-10-CM | POA: Diagnosis not present

## 2024-09-07 DIAGNOSIS — O4202 Full-term premature rupture of membranes, onset of labor within 24 hours of rupture: Secondary | ICD-10-CM | POA: Diagnosis not present

## 2024-09-07 DIAGNOSIS — Z3480 Encounter for supervision of other normal pregnancy, unspecified trimester: Principal | ICD-10-CM

## 2024-09-07 DIAGNOSIS — O99019 Anemia complicating pregnancy, unspecified trimester: Secondary | ICD-10-CM | POA: Diagnosis present

## 2024-09-07 DIAGNOSIS — Z3483 Encounter for supervision of other normal pregnancy, third trimester: Secondary | ICD-10-CM

## 2024-09-07 DIAGNOSIS — O4292 Full-term premature rupture of membranes, unspecified as to length of time between rupture and onset of labor: Secondary | ICD-10-CM | POA: Diagnosis present

## 2024-09-07 LAB — RPR: RPR Ser Ql: NONREACTIVE

## 2024-09-07 LAB — TYPE AND SCREEN
ABO/RH(D): O POS
Antibody Screen: NEGATIVE

## 2024-09-07 LAB — CBC
HCT: 32.7 % — ABNORMAL LOW (ref 36.0–46.0)
Hemoglobin: 10.6 g/dL — ABNORMAL LOW (ref 12.0–15.0)
MCH: 26 pg (ref 26.0–34.0)
MCHC: 32.4 g/dL (ref 30.0–36.0)
MCV: 80.1 fL (ref 80.0–100.0)
Platelets: 205 K/uL (ref 150–400)
RBC: 4.08 MIL/uL (ref 3.87–5.11)
RDW: 20.3 % — ABNORMAL HIGH (ref 11.5–15.5)
WBC: 7.2 K/uL (ref 4.0–10.5)
nRBC: 0 % (ref 0.0–0.2)

## 2024-09-07 MED ORDER — OXYTOCIN-SODIUM CHLORIDE 30-0.9 UT/500ML-% IV SOLN
2.5000 [IU]/h | INTRAVENOUS | Status: DC
  Administered 2024-09-07: 2.5 [IU]/h via INTRAVENOUS

## 2024-09-07 MED ORDER — DIBUCAINE (PERIANAL) 1 % EX OINT: 1.0000 | TOPICAL_OINTMENT | CUTANEOUS | Status: DC | PRN

## 2024-09-07 MED ORDER — ACETAMINOPHEN 325 MG PO TABS
650.0000 mg | ORAL_TABLET | ORAL | Status: DC | PRN
  Administered 2024-09-07: 650 mg via ORAL
  Filled 2024-09-07: qty 2

## 2024-09-07 MED ORDER — TETANUS-DIPHTH-ACELL PERTUSSIS 5-2-15.5 LF-MCG/0.5 IM SUSP: 0.5000 mL | Freq: Once | INTRAMUSCULAR | Status: DC

## 2024-09-07 MED ORDER — LACTATED RINGERS IV SOLN: 500.0000 mL | INTRAVENOUS | Status: DC | PRN

## 2024-09-07 MED ORDER — SENNOSIDES-DOCUSATE SODIUM 8.6-50 MG PO TABS
2.0000 | ORAL_TABLET | ORAL | Status: DC
  Administered 2024-09-08: 2 via ORAL
  Filled 2024-09-07: qty 2

## 2024-09-07 MED ORDER — PHENYLEPHRINE 80 MCG/ML (10ML) SYRINGE FOR IV PUSH (FOR BLOOD PRESSURE SUPPORT): 80.0000 ug | PREFILLED_SYRINGE | INTRAVENOUS | Status: DC | PRN

## 2024-09-07 MED ORDER — EPHEDRINE 5 MG/ML INJ: 10.0000 mg | INTRAVENOUS | Status: DC | PRN

## 2024-09-07 MED ORDER — IBUPROFEN 600 MG PO TABS
600.0000 mg | ORAL_TABLET | Freq: Four times a day (QID) | ORAL | Status: DC
  Administered 2024-09-08 – 2024-09-09 (×7): 600 mg via ORAL
  Filled 2024-09-07 (×7): qty 1

## 2024-09-07 MED ORDER — ONDANSETRON HCL 4 MG PO TABS: 4.0000 mg | ORAL_TABLET | ORAL | Status: DC | PRN

## 2024-09-07 MED ORDER — DIPHENHYDRAMINE HCL 25 MG PO CAPS: 25.0000 mg | ORAL_CAPSULE | Freq: Four times a day (QID) | ORAL | Status: DC | PRN

## 2024-09-07 MED ORDER — ZOLPIDEM TARTRATE 5 MG PO TABS: 5.0000 mg | ORAL_TABLET | Freq: Every evening | ORAL | Status: DC | PRN

## 2024-09-07 MED ORDER — LACTATED RINGERS IV SOLN
500.0000 mL | Freq: Once | INTRAVENOUS | Status: AC
  Administered 2024-09-07: 500 mL via INTRAVENOUS

## 2024-09-07 MED ORDER — SIMETHICONE 80 MG PO CHEW: 80.0000 mg | CHEWABLE_TABLET | ORAL | Status: DC | PRN

## 2024-09-07 MED ORDER — ONDANSETRON HCL 4 MG/2ML IJ SOLN: 4.0000 mg | INTRAMUSCULAR | Status: DC | PRN

## 2024-09-07 MED ORDER — LIDOCAINE HCL (PF) 1 % IJ SOLN: 30.0000 mL | INTRAMUSCULAR | Status: DC | PRN

## 2024-09-07 MED ORDER — TERBUTALINE SULFATE 1 MG/ML IJ SOLN: 0.2500 mg | Freq: Once | INTRAMUSCULAR | Status: DC | PRN

## 2024-09-07 MED ORDER — LIDOCAINE HCL (PF) 1 % IJ SOLN
INTRAMUSCULAR | Status: DC | PRN
  Administered 2024-09-07: 11 mL via EPIDURAL

## 2024-09-07 MED ORDER — SOD CITRATE-CITRIC ACID 500-334 MG/5ML PO SOLN: 30.0000 mL | ORAL | Status: DC | PRN

## 2024-09-07 MED ORDER — OXYTOCIN BOLUS FROM INFUSION
333.0000 mL | Freq: Once | INTRAVENOUS | Status: AC
  Administered 2024-09-07: 333 mL via INTRAVENOUS

## 2024-09-07 MED ORDER — MEASLES, MUMPS & RUBELLA VAC ~~LOC~~ SUSR: 0.5000 mL | Freq: Once | SUBCUTANEOUS | Status: DC

## 2024-09-07 MED ORDER — ONDANSETRON HCL 4 MG/2ML IJ SOLN: 4.0000 mg | Freq: Four times a day (QID) | INTRAMUSCULAR | Status: DC | PRN

## 2024-09-07 MED ORDER — DIPHENHYDRAMINE HCL 50 MG/ML IJ SOLN: 12.5000 mg | INTRAMUSCULAR | Status: DC | PRN

## 2024-09-07 MED ORDER — PRENATAL MULTIVITAMIN CH
1.0000 | ORAL_TABLET | Freq: Every day | ORAL | Status: DC
  Administered 2024-09-08 – 2024-09-09 (×2): 1 via ORAL
  Filled 2024-09-07 (×2): qty 1

## 2024-09-07 MED ORDER — FENTANYL-BUPIVACAINE-NACL 0.5-0.125-0.9 MG/250ML-% EP SOLN
12.0000 mL/h | EPIDURAL | Status: DC | PRN
  Administered 2024-09-07: 12 mL/h via EPIDURAL
  Filled 2024-09-07: qty 250

## 2024-09-07 MED ORDER — LACTATED RINGERS IV SOLN: INTRAVENOUS | Status: DC

## 2024-09-07 MED ORDER — WITCH HAZEL-GLYCERIN EX PADS: 1.0000 | MEDICATED_PAD | CUTANEOUS | Status: DC | PRN

## 2024-09-07 MED ORDER — OXYTOCIN-SODIUM CHLORIDE 30-0.9 UT/500ML-% IV SOLN
1.0000 m[IU]/min | INTRAVENOUS | Status: DC
  Administered 2024-09-07: 2 m[IU]/min via INTRAVENOUS
  Filled 2024-09-07: qty 500

## 2024-09-07 MED ORDER — COCONUT OIL OIL: 1.0000 | TOPICAL_OIL | Status: DC | PRN

## 2024-09-07 MED ORDER — BENZOCAINE-MENTHOL 20-0.5 % EX AERO: 1.0000 | INHALATION_SPRAY | CUTANEOUS | Status: DC | PRN

## 2024-09-07 MED ORDER — ACETAMINOPHEN 325 MG PO TABS: 650.0000 mg | ORAL_TABLET | ORAL | Status: DC | PRN

## 2024-09-07 MED ORDER — OXYCODONE HCL 5 MG PO TABS: 5.0000 mg | ORAL_TABLET | ORAL | Status: DC | PRN

## 2024-09-07 NOTE — Progress Notes (Signed)
 LOW-RISK PREGNANCY VISIT Patient name: Bailey Palmer MRN [id number]  Date of birth: 1996-05-19 Chief Complaint:   Routine Prenatal Visit  History of Present Illness:   Bailey Palmer is a 28 y.o. G15P3003 female at [redacted]w[redacted]d with an Estimated Date of Delivery: 09/17/24 being seen today for ongoing management of a low-risk pregnancy.   Today she reports leaking clear fluid since Mon night at ~10pm, just little amounts then, this am larger amounts. Has soaked through 2 pads this morning. No pain/contractions.. Contractions: Not present. Vag. Bleeding: None.  Movement: Present.     07/22/2024    2:22 PM 07/15/2024    1:56 PM 07/13/2024    2:50 PM 07/08/2024    8:59 AM 06/28/2024    9:03 AM  Depression screen PHQ 2/9  Decreased Interest 0 0 0 0 0  Down, Depressed, Hopeless 0 0 0 0 0  PHQ - 2 Score 0 0 0 0 0  Altered sleeping    0 0  Tired, decreased energy    0 0  Change in appetite     0  Feeling bad or failure about yourself     0 0  Trouble concentrating    0 0  Moving slowly or fidgety/restless    0 0  Suicidal thoughts    0 0  PHQ-9 Score    0 0  Difficult doing work/chores    Not difficult at all         03/08/2024    8:36 AM 02/03/2023    2:26 PM 04/17/2021    8:48 AM  GAD 7 : Generalized Anxiety Score  Nervous, Anxious, on Edge 0 0 0  Control/stop worrying 0 0 0  Worry too much - different things 0 0 0  Trouble relaxing 0 0 0  Restless 0 0 0  Easily annoyed or irritable 0 0 0  Afraid - awful might happen 0 0 0  Total GAD 7 Score 0 0 0      Review of Systems:   Pertinent items are noted in HPI Denies abnormal vaginal discharge w/ itching/odor/irritation, headaches, visual changes, shortness of breath, chest pain, abdominal pain, severe nausea/vomiting, or problems with urination or bowel movements unless otherwise stated above. Pertinent History Reviewed:  Reviewed past medical,surgical, social, obstetrical and family history.  Reviewed problem list, medications and  allergies. Physical Assessment:   Vitals:   09/07/24 0908  BP: 129/83  Pulse: 79  Weight: 207 lb 3.2 oz (94 kg)  Body mass index is 36.7 kg/m.        Physical Examination:   General appearance: Well appearing, and in no distress  Mental status: Alert, oriented to person, place, and time  Skin: Warm & dry  Cardiovascular: Normal heart rate noted  Respiratory: Normal respiratory effort, no distress  Abdomen: Soft, gravid, nontender  Pelvic: clear fluid leaking from vagina, slide held to vulva,          Extremities: Edema: None  Fetal Status: Fetal Heart Rate (bpm): 143 Fundal Height: 37 cm Movement: Present    Vtx by informal bedside u/s  Chaperone: Aleck Blase No results found for this or any previous visit (from the past 24 hours).  Assessment & Plan:  1) Low-risk pregnancy G4P3003 at [redacted]w[redacted]d with an Estimated Date of Delivery: 09/17/24   2) Prolonged PROM, as early as Mon at 2200, to L&D for direct admit, notified Dr. Kandis, Dr. Danny and L&D charge   Meds: No orders of the  defined types were placed in this encounter.  Labs/procedures today: fern  Follow-up: Return for will schedule pp visit after delivery.  Future Appointments  Date Time Provider Department Center  09/07/2024 10:10 AM Kizzie Suzen SAUNDERS, CNM [facility]    No orders of the defined types were placed in this encounter.  Suzen SAUNDERS Kizzie CNM, [facility] 09/07/2024 9:48 AM

## 2024-09-07 NOTE — Anesthesia Preprocedure Evaluation (Addendum)
Anesthesia Evaluation  Patient identified by MRN, date of birth, ID band Patient awake    Reviewed: Allergy & Precautions, Patient's Chart, lab work & pertinent test results  Airway Mallampati: II  TM Distance: >3 FB Neck ROM: Full    Dental no notable dental hx.    Pulmonary neg pulmonary ROS   Pulmonary exam normal breath sounds clear to auscultation       Cardiovascular Normal cardiovascular exam+ Valvular Problems/Murmurs (childhood heart murmur, no tx)  Rhythm:Regular Rate:Normal     Neuro/Psych negative neurological ROS  negative psych ROS   GI/Hepatic negative GI ROS, Neg liver ROS,,,  Endo/Other  Obesity BMI 36  Renal/GU negative Renal ROS  negative genitourinary   Musculoskeletal negative musculoskeletal ROS (+)    Abdominal  (+) + obese  Peds negative pediatric ROS (+)  Hematology  (+) Blood dyscrasia, anemia Hb 7.8, plt 252   Anesthesia Other Findings   Reproductive/Obstetrics (+) Pregnancy                             Anesthesia Physical Anesthesia Plan  ASA: 2  Anesthesia Plan: Epidural   Post-op Pain Management:    Induction:   PONV Risk Score and Plan: 2  Airway Management Planned: Natural Airway  Additional Equipment: None  Intra-op Plan:   Post-operative Plan:   Informed Consent: I have reviewed the patients History and Physical, chart, labs and discussed the procedure including the risks, benefits and alternatives for the proposed anesthesia with the patient or authorized representative who has indicated his/her understanding and acceptance.       Plan Discussed with:   Anesthesia Plan Comments:        Anesthesia Quick Evaluation

## 2024-09-07 NOTE — Anesthesia Procedure Notes (Signed)
 Epidural Patient location during procedure: OB Start time: 09/07/2024 2:37 PM End time: 09/07/2024 2:52 PM  Staffing Anesthesiologist: Cleotilde Butler Dade, MD Performed: anesthesiologist   Preanesthetic Checklist Completed: patient identified, IV checked, site marked, risks and benefits discussed, surgical consent, monitors and equipment checked, pre-op evaluation and timeout performed  Epidural Patient position: sitting Prep: ChloraPrep Patient monitoring: heart rate, cardiac monitor, continuous pulse ox and blood pressure Approach: midline Location: L2-L3 Injection technique: LOR saline  Needle:  Needle type: Tuohy  Needle gauge: 17 G Needle length: 9 cm Needle insertion depth: 7 cm Catheter type: closed end flexible Catheter size: 20 Guage Catheter at skin depth: 11 cm Test dose: negative  Assessment Events: blood not aspirated, injection not painful, no injection resistance, no paresthesia and negative IV test  Additional Notes Reason for block:procedure for pain

## 2024-09-07 NOTE — Discharge Summary (Signed)
 Postpartum Discharge Summary  Date of Service updated***     Patient Name: Bailey Palmer DOB: [birth year] MRN: [id number]  Date of admission: 09/07/2024 Delivery date:09/07/2024 Delivering provider: LORELI IHA D Date of discharge: 09/07/2024  Admitting diagnosis: PROM (premature rupture of membranes) [O42.90] Intrauterine pregnancy: [redacted]w[redacted]d     Secondary diagnosis:  Active Problems:   Prior fetal macrosomia, antepartum   Anemia complicating pregnancy   PROM (premature rupture of membranes)  Additional problems: none    Discharge diagnosis: Term Pregnancy Delivered                                              Post partum procedures:none Augmentation: Pitocin  Complications: ROM>24 hours (potentially with ROM ~48hrs prior to delivery; no s/s Triple I)  Hospital course: Induction of Labor With Vaginal Delivery   28 y.o. yo H5E5995 at [redacted]w[redacted]d was admitted to the hospital 09/07/2024 for induction of labor.  Indication for induction: PROM- she presented to the office 10/1 with possible leaking since 9/29.  Patient had an labor course complicated by tight shoulders at delivery; head to delivery time <39min. Membrane Rupture Time/Date: 10:00 PM,09/05/2024  Delivery Method:Vaginal, Spontaneous Operative Delivery:N/A Episiotomy: None Lacerations:  None Details of delivery can be found in separate delivery note.  Patient had a postpartum course complicated by***. Patient is discharged home 09/07/24.  Newborn Data: Birth date:09/07/2024 Birth time:9:10 PM Gender:Female Living status:Living Apgars:7 ,9  Weight:4250 g (9lb 5.9oz)  Magnesium Sulfate received: No BMZ received: No Rhophylac:N/A MMR:N/A T-DaP:Given prenatally Flu: No RSV Vaccine received: No Transfusion:No  Immunizations received: Immunization History  Administered Date(s) Administered   Influenza,inj,Quad PF,6+ Mos 09/18/2016, 12/16/2017   MMR 07/06/2018   Tdap 05/05/2018, 05/09/2022, 05/18/2023, 06/28/2024     Physical exam  Vitals:   09/07/24 2115 09/07/24 2116 09/07/24 2131 09/07/24 2146  BP: 107/89 118/64 114/76 96/79  Pulse: (!) 107 (!) 104 (!) 171 96  Resp:  16    Temp:  98 F (36.7 C)    TempSrc:  Oral    SpO2:      Weight:      Height:       General: {Exam; general:21111117} Lochia: {Desc; appropriate/inappropriate:30686::appropriate} Uterine Fundus: {Desc; firm/soft:30687} Incision: {Exam; incision:21111123} DVT Evaluation: {Exam; dvt:2111122} Labs: Lab Results  Component Value Date   WBC 7.2 09/07/2024   HGB 10.6 (L) 09/07/2024   HCT 32.7 (L) 09/07/2024   MCV 80.1 09/07/2024   PLT 205 09/07/2024      Latest Ref Rng & Units 11/14/2023    6:16 PM  CMP  Glucose 70 - 99 mg/dL 84   BUN 6 - 20 mg/dL 12   Creatinine 9.55 - 1.00 mg/dL 9.52   Sodium 864 - 854 mmol/L 136   Potassium 3.5 - 5.1 mmol/L 3.5   Chloride 98 - 111 mmol/L 105   CO2 22 - 32 mmol/L 23   Calcium 8.9 - 10.3 mg/dL 8.9    Edinburgh Score:    09/10/2023    1:51 PM  Edinburgh Postnatal Depression Scale Screening Tool  I have been able to laugh and see the funny side of things. 0  I have looked forward with enjoyment to things. 0  I have blamed myself unnecessarily when things went wrong. 0  I have been anxious or worried for no good reason. 0  I have felt scared or panicky  for no good reason. 0  Things have been getting on top of me. 0  I have been so unhappy that I have had difficulty sleeping. 0  I have felt sad or miserable. 0  I have been so unhappy that I have been crying. 0  The thought of harming myself has occurred to me. 0  Edinburgh Postnatal Depression Scale Total 0      Data saved with a previous flowsheet row definition   No data recorded  After visit meds:  Allergies as of 09/07/2024   No Known Allergies   Med Rec must be completed prior to using this [facility]***        Discharge home in stable condition Infant Feeding: {Baby feeding:23562} Infant  Disposition:{CHL IP OB HOME WITH FNUYZM:76418} Discharge instruction: per After Visit Summary and Postpartum booklet. Activity: Advance as tolerated. Pelvic rest for 6 weeks.  Diet: routine diet Future Appointments:No future appointments. Follow up Visit:  Loreli Suzen BIRCH, CNM  P Ft Admin Pool Please schedule this patient for Postpartum visit in: 6 weeks with the following provider: Any provider In-Person For C/S patients schedule nurse incision check in weeks 2 weeks: no Low risk pregnancy complicated by: none Delivery mode:  SVD Anticipated Birth Control:  IUD PP Procedures needed: IUD placement Schedule Integrated BH visit: no   09/07/2024 Suzen BIRCH Loreli, CNM

## 2024-09-07 NOTE — H&P (Signed)
 LABOR AND DELIVERY ADMISSION HISTORY AND PHYSICAL NOTE  Bailey Palmer is a 28 y.o. female [phone] with IUP at [redacted]w[redacted]d by L/8 presenting for prom diagnosed in clinic today.   She reports positive fetal movement. She denies contractions or vaginal bleeding.  Prenatal History/Complications:  Past Medical History: Past Medical History:  Diagnosis Date   Anemia    IDA   Chronic abdominal pain    Cyst near tailbone    DUB (dysfunctional uterine bleeding)    Heart murmur    Trichimoniasis 08/08/2020   Treated with flagyl  for BV on 8/31 which will cover trich too.     Past Surgical History: Past Surgical History:  Procedure Laterality Date   APPENDECTOMY     CHOLECYSTECTOMY     MASS EXCISION N/A 04/24/2017   Procedure: EXCISION PILONDIAL CYST;  Surgeon: Mavis Anes, MD;  Location: [facility];  Service: General;  Laterality: N/A;   WISDOM TOOTH EXTRACTION      Obstetrical History: OB History     Gravida  4   Para  3   Term  3   Preterm      AB      Living  3      SAB      IAB      Ectopic      Multiple  0   Live Births  3           Social History: Social History   Socioeconomic History   Marital status: Single    Spouse name: Not on file   Number of children: 2   Years of education: Not on file   Highest education level: Not on file  Occupational History   Not on file  Tobacco Use   Smoking status: Never   Smokeless tobacco: Never  Vaping Use   Vaping status: Never Used  Substance and Sexual Activity   Alcohol use: No    Alcohol/week: 0.0 standard drinks of alcohol   Drug use: No   Sexual activity: Yes    Birth control/protection: None  Other Topics Concern   Not on file  Social History Narrative   Not on file   Social Drivers of Health   Financial Resource Strain: Low Risk  (03/08/2024)   Overall Financial Resource Strain (CARDIA)    Difficulty of Paying Living Expenses: Not hard at all  Food Insecurity: No Food Insecurity  (09/07/2024)   Hunger Vital Sign    Worried About Running Out of Food in the Last Year: Never true    Ran Out of Food in the Last Year: Never true  Transportation Needs: No Transportation Needs (09/07/2024)   PRAPARE - Administrator, Civil Service (Medical): No    Lack of Transportation (Non-Medical): No  Physical Activity: Sufficiently Active (03/08/2024)   Exercise Vital Sign    Days of Exercise per Week: 5 days    Minutes of Exercise per Session: 90 min  Stress: No Stress Concern Present (03/08/2024)   [employer] of Occupational Health - Occupational Stress Questionnaire    Feeling of Stress : Not at all  Social Connections: Moderately Isolated (09/07/2024)   Social Connection and Isolation Panel    Frequency of Communication with Friends and Family: Three times a week    Frequency of Social Gatherings with Friends and Family: Three times a week    Attends Religious Services: Never    Active Member of Clubs or Organizations: No    Attends Club or  Organization Meetings: Never    Marital Status: Living with partner    Family History: Family History  Problem Relation Age of Onset   Diabetes Maternal Grandmother    Hypertension Maternal Grandmother    Heart disease Maternal Grandmother    Asthma Mother    Miscarriages / Stillbirths Mother    Varicose Veins Mother    Alpha-1 antitrypsin deficiency Mother        carrier   ADD / ADHD Brother    Asthma Maternal Aunt    Alpha-1 antitrypsin deficiency Maternal Aunt    Depression Maternal Aunt     Allergies: No Known Allergies  Medications Prior to Admission  Medication Sig Dispense Refill Last Dose/Taking   acetaminophen  (TYLENOL ) 325 MG tablet Take 2 tablets (650 mg total) by mouth every 4 (four) hours as needed (for pain scale < 4).   Past Week   ferrous sulfate  325 (65 FE) MG tablet Take 1 tablet (325 mg total) by mouth every other day. 45 tablet 2 Past Month   Prenatal MV & Min w/FA-DHA (PRENATAL GUMMIES)  0.18-25 MG CHEW Take as directed 90 tablet PRN Past Month     Review of Systems   All systems reviewed and negative except as stated in HPI  Blood pressure 130/75, pulse 80, temperature 97.8 F (36.6 C), temperature source Oral, resp. rate 16, height 5' 3 (1.6 m), weight 94 kg, last menstrual period 12/12/2023, not currently breastfeeding. General appearance:   Lungs: clear to auscultation bilaterally Heart: regular rate and rhythm Abdomen: soft, non-tender; bowel sounds normal Extremities: No calf swelling or tenderness Presentation: cephalic by u/s Fetal monitoring: cat 1 Uterine activity: quiet     Prenatal labs: ABO, Rh: --/--/PENDING (10/01 1144) Antibody: PENDING (10/01 1144) Rubella: 1.03 (04/01 1002) RPR: Non Reactive (07/22 0812)  HBsAg: Negative (04/01 1002)  HIV: Non Reactive (07/22 9187)  GBS: Negative/-- (09/17 1641)  2 hr Glucola: wnl Genetic screening:  wnl Anatomy US : wnl  Prenatal Transfer Tool  Maternal Diabetes: No Genetic Screening: Normal Maternal Ultrasounds/Referrals: Normal Fetal Ultrasounds or other Referrals:  None Maternal Substance Abuse:  No Significant Maternal Medications:  None Significant Maternal Lab Results: Group B Strep negative  Results for orders placed or performed during the hospital encounter of 09/07/24 (from the past 24 hours)  CBC   Collection Time: 09/07/24 11:44 AM  Result Value Ref Range   WBC 7.2 4.0 - 10.5 K/uL   RBC 4.08 3.87 - 5.11 MIL/uL   Hemoglobin 10.6 (L) 12.0 - 15.0 g/dL   HCT 67.2 (L) 63.9 - 53.9 %   MCV 80.1 80.0 - 100.0 fL   MCH 26.0 26.0 - 34.0 pg   MCHC 32.4 30.0 - 36.0 g/dL   RDW 79.6 (H) 88.4 - 84.4 %   Platelets 205 150 - 400 K/uL   nRBC 0.0 0.0 - 0.2 %  Type and screen [facility]   Collection Time: 09/07/24 11:44 AM  Result Value Ref Range   ABO/RH(D) PENDING    Antibody Screen PENDING    Sample Expiration      09/10/2024,2359 Performed at [facility], 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 G4P3003 at [redacted]w[redacted]d here for prom possibly as early as 2 days ago, grossly ruptured in clinic today.  #Labor: start pitocin  #Pain: Eventual epidural #FWB: Cat 1 #ID:  Gbs neg, no signs triple i #MOF: bottl #MOC: interval IUD #Circ:  N/a #Anemia: cbc pending  Duwane Gewirtz B Jayshon Dommer 09/07/2024, 12:19 PM

## 2024-09-07 NOTE — Patient Instructions (Signed)
 Bailey Palmer, thank you for choosing our office today! We appreciate the opportunity to meet your healthcare needs. You may receive a short survey by mail, e-mail, or through [employer]. If you are happy with your care we would appreciate if you could take just a few minutes to complete the survey questions. We read all of your comments and take your feedback very seriously. Thank you again for choosing our office.  Center for [employer] Team at [facility]  [facility] at [facility] ([address] [place], [place] [address]) Entrance C, located off of [address] Free 24/7 valet parking   CLASSES: Go to [email or web address] to register for classes (childbirth, breastfeeding, waterbirth, infant CPR, daddy bootcamp, etc.)  Call the office [phone]) or go to [facility] if: You begin to have strong, frequent contractions Your water breaks.  Sometimes it is a big gush of fluid, sometimes it is just a trickle that keeps getting your panties wet or running down your legs You have vaginal bleeding.  It is normal to have a small amount of spotting if your cervix was checked.  You don't feel your baby moving like normal.  If you don't, get you something to eat and drink and lay down and focus on feeling your baby move.   If your baby is still not moving like normal, you should call the office or go to [facility].  Call the office [phone]) or go to [facility] hospital for these signs of pre-eclampsia: Severe headache that does not go away with Tylenol Visual changes- seeing spots, double, blurred vision Pain under your right breast or upper abdomen that does not go away with Tums or heartburn medicine Nausea and/or vomiting Severe swelling in your hands, feet, and face   [facility] Pediatricians/Family Doctors [facility]): [address] Colette Ribas, [phone]           [facility]: [address], [phone]                 [facility]): [address], [phone] (call to ask if accepting patients) [facility] Department: [address], [place], [phone]    [facility] Pediatricians/Family Doctors [facility]): [address], [phone] [facility]: [address] [place], [phone] [facility]: [address], [phone]  [facility] Doctors  [facility]): [phone] [facility]: [address], [phone]   [facility] Doctors [facility]: [address], [phone]  [facility] Doctors  [facility]: [phone], [phone]  Home Blood Pressure Monitoring for Patients   Your provider has recommended that you check your blood pressure (BP) at least once a week at home. If you do not have a blood pressure cuff at home, one will be provided for you. Contact your provider if you have not received your monitor within 1 week.   Helpful Tips for Accurate Home Blood Pressure Checks  Don't smoke, exercise, or drink caffeine 30 minutes before checking your BP Use the restroom before checking your BP (a full bladder can raise your pressure) Relax in a comfortable upright chair Feet on the ground Left arm resting comfortably on a flat surface at the level of your heart Legs uncrossed Back supported Sit quietly and don't talk Place the cuff on your bare arm Adjust snuggly, so that only two fingertips  can fit between your skin and the top of the cuff Check 2 readings separated by at least one minute Keep a log of your BP readings For a visual, please reference this diagram: http://ccnc.care/bpdiagram  Provider Name: [facility]     Phone: [phone]  Zone 1: ALL CLEAR  Continue to monitor your symptoms:  BP reading is less than 140 (top number) or less than 90 (bottom number)  No right  upper stomach pain No headaches or seeing spots No feeling nauseated or throwing up No swelling in face and hands  Zone 2: CAUTION Call your doctor's office for any of the following:  BP reading is greater than 140 (top number) or greater than 90 (bottom number)  Stomach pain under your ribs in the middle or right side Headaches or seeing spots Feeling nauseated or throwing up Swelling in face and hands  Zone 3: EMERGENCY  Seek immediate medical care if you have any of the following:  BP reading is greater than160 (top number) or greater than 110 (bottom number) Severe headaches not improving with Tylenol Serious difficulty catching your breath Any worsening symptoms from Zone 2   Braxton Hicks Contractions Contractions of the uterus can occur throughout pregnancy, but they are not always a sign that you are in labor. You may have practice contractions called Braxton Hicks contractions. These false labor contractions are sometimes confused with true labor. What are Bailey Palmer contractions? Braxton Hicks contractions are tightening movements that occur in the muscles of the uterus before labor. Unlike true labor contractions, these contractions do not result in opening (dilation) and thinning of the cervix. Toward the end of pregnancy (32-34 weeks), Braxton Hicks contractions can happen more often and may become stronger. These contractions are sometimes difficult to tell apart from true labor because they can be very uncomfortable. You should not feel embarrassed if you go to the hospital with false labor. Sometimes, the only way to tell if you are in true labor is for your health care provider to look for changes in the cervix. The health care provider will do a physical exam and may monitor your contractions. If you are not in true labor, the exam should show that your cervix is not dilating and your water has not broken. If there are no other health problems associated with your  pregnancy, it is completely safe for you to be sent home with false labor. You may continue to have Braxton Hicks contractions until you go into true labor. How to tell the difference between true labor and false labor True labor Contractions last 30-70 seconds. Contractions become very regular. Discomfort is usually felt in the top of the uterus, and it spreads to the lower abdomen and low back. Contractions do not go away with walking. Contractions usually become more intense and increase in frequency. The cervix dilates and gets thinner. False labor Contractions are usually shorter and not as strong as true labor contractions. Contractions are usually irregular. Contractions are often felt in the front of the lower abdomen and in the groin. Contractions may go away when you walk around or change positions while lying down. Contractions get weaker and are shorter-lasting as time goes on. The cervix usually does not dilate or become thin. Follow these instructions at home:  Take over-the-counter and prescription medicines only as told by your health care provider. Keep up with your usual exercises and follow other instructions from your health care provider. Eat and drink lightly if you think  you are going into labor. If Braxton Hicks contractions are making you uncomfortable: Change your position from lying down or resting to walking, or change from walking to resting. Sit and rest in a tub of warm water. Drink enough fluid to keep your urine pale yellow. Dehydration may cause these contractions. Do slow and deep breathing several times an hour. Keep all follow-up prenatal visits as told by your health care provider. This is important. Contact a health care provider if: You have a fever. You have continuous pain in your abdomen. Get help right away if: Your contractions become stronger, more regular, and closer together. You have fluid leaking or gushing from your vagina. You pass  blood-tinged mucus (bloody show). You have bleeding from your vagina. You have low back pain that you never had before. You feel your baby's head pushing down and causing pelvic pressure. Your baby is not moving inside you as much as it used to. Summary Contractions that occur before labor are called Braxton Hicks contractions, false labor, or practice contractions. Braxton Hicks contractions are usually shorter, weaker, farther apart, and less regular than true labor contractions. True labor contractions usually become progressively stronger and regular, and they become more frequent. Manage discomfort from [facility] contractions by changing position, resting in a warm bath, drinking plenty of water, or practicing deep breathing. This information is not intended to replace advice given to you by your health care provider. Make sure you discuss any questions you have with your health care provider. Document Revised: 11/06/2017 Document Reviewed: 04/09/2017 Elsevier Patient Education  2020 [employer].

## 2024-09-07 NOTE — Progress Notes (Signed)
 Patient ID: Bailey Palmer, female   DOB: September 23, 1996, 28 y.o.   MRN: [id number]  Feeling more pressure during ctx; just given PCA epidural dose; overall still well  VSS, afebrile FHR 130s, +accels, occ mi variables Ctx q 2-4 mins with Pit at 6mu/min Cx 8/70/vtx -2 by RN at 1941  IUP@38 .4 Active labor/approaching transition  When feels increased pressure will verify complete dilation and begin pushing Anticipate vag delivery  Suzen JONETTA Gentry CNM 09/07/2024 7:44 PM

## 2024-09-08 NOTE — Progress Notes (Addendum)
 POSTPARTUM PROGRESS NOTE  Post Partum Day 1  Subjective:  Bailey Palmer is a 28 y.o. H5E5995 s/p SVD at [redacted]w[redacted]d.  She reports she is doing well. No acute events overnight. She denies any problems with ambulating, voiding or po intake.  Pain is well controlled.  Lochia is Normal.  Objective: Blood pressure 116/69, pulse 100, temperature (!) 97.5 F (36.4 C), temperature source Oral, resp. rate 16, height 5' 3 (1.6 m), weight 94 kg, last menstrual period 12/12/2023, SpO2 98%, unknown if currently breastfeeding.  BP Readings from Last 3 Encounters:  09/08/24 116/69  09/07/24 129/83  08/30/24 119/79    Physical Exam:  General: alert, cooperative and no distress Chest: no respiratory distress Heart: tachycardic  Uterine Fundus:    Recent Labs    09/07/24 1144  HGB 10.6*  HCT 32.7*    Assessment/Plan: Bailey Palmer is a 28 y.o. H5E5995 s/p NSVD at [redacted]w[redacted]d   PPD# 1 - Doing well  Routine postpartum care  Delivery Complications: anemia Blood Pressure: normal Anemia/Hb Status: Stable, appropriate postpartum Hb, no intervention indicated Contraception: Outpatient IUD Feeding: breast feeding and bottle feeding Needs Lactation Consultation: Yes    Dispo: Plan for discharge tomorrow.   LOS: 1 day   Cozetta JINNY Palmer, Medical Student OB Fellow  09/08/2024, 7:34 AM   I was present for the exam and agree with above. Baby in not morning left arm normally. Has not been examined by Peds yet. Will ask them to come soon. Discussed birth complicated  by < 1 min shoulder dystocia.   Bailey Palmer, Bailey Palmer , CNM 09/08/2024 12:51 PM

## 2024-09-08 NOTE — Lactation Note (Signed)
 This note was copied from a baby's chart. Lactation Consultation Note  Patient Name: Bailey Palmer Unijb'd Date: 09/08/2024 Age:27 hours   Mom declines Lactation services.   Maternal Data    Feeding    LATCH Score                    Lactation Tools Discussed/Used    Interventions    Discharge    Consult Status Consult Status: Complete    Jaxson Keener G 09/08/2024, 2:49 AM

## 2024-09-08 NOTE — Anesthesia Postprocedure Evaluation (Signed)
 Anesthesia Post Note  Patient: Bailey Palmer  Procedure(s) Performed: AN AD HOC LABOR EPIDURAL     Patient location during evaluation: Mother Baby Anesthesia Type: Epidural Level of consciousness: awake, oriented and awake and alert Pain management: pain level controlled Vital Signs Assessment: post-procedure vital signs reviewed and stable Respiratory status: spontaneous breathing, nonlabored ventilation and respiratory function stable Cardiovascular status: stable Postop Assessment: no headache, patient able to bend at knees, adequate PO intake, able to ambulate and no apparent nausea or vomiting Anesthetic complications: no   No notable events documented.  Last Vitals:  Vitals:   09/07/24 2350 09/08/24 0500  BP: 120/65 116/69  Pulse: 100   Resp: 18 16  Temp: 37.2 C (!) 36.4 C  SpO2: 98%     Last Pain:  Vitals:   09/08/24 0642  TempSrc:   PainSc: 3    Pain Goal:                   Jamir Rone

## 2024-09-09 ENCOUNTER — Other Ambulatory Visit (HOSPITAL_COMMUNITY): Payer: Self-pay

## 2024-09-09 MED ORDER — COCONUT OIL OIL: 1.0000 | TOPICAL_OIL | Status: DC | PRN

## 2024-09-09 MED ORDER — WITCH HAZEL-GLYCERIN EX PADS
1.0000 | MEDICATED_PAD | CUTANEOUS | 12 refills | Status: DC | PRN
  Filled 2024-09-09: qty 40, 15d supply, fill #0

## 2024-09-09 MED ORDER — DIBUCAINE (PERIANAL) 1 % EX OINT: 1.0000 | TOPICAL_OINTMENT | CUTANEOUS | Status: DC | PRN

## 2024-09-09 MED ORDER — IBUPROFEN 600 MG PO TABS
600.0000 mg | ORAL_TABLET | Freq: Four times a day (QID) | ORAL | 0 refills | Status: DC
  Filled 2024-09-09: qty 30, 8d supply, fill #0

## 2024-09-09 NOTE — Patient Instructions (Signed)
 If interested in an outpatient lactation consult in office or virtually please reach out to us  at [facility] (First Floor) [address] [place]  Please feel free to out with any lactation related questions or concerns [phone]  to leave a message for our lactation voicemail box.  Lactation support groups:  [facility], Tuesdays 10:00 am -12:00 pm at [address] on the second floor in the conference room, lactating parents and lap babies welcome.  [email or web address]  [email or web address]  Si est interesado en una consulta ambulatoria sobre lactancia en el consultorio o virtualmente, comunquese con nosotros al [facility] scientist) [address] [place], [place] No dude en comunicarse con cualquier pregunta o inquietud relacionada con la lactancia al [phone] para dejar un mensaje en nuestro buzn de voz de lactancia  Grupos de apoyo para la lactancia:  [facility], martes de 10:00 a. m. a 12:00 p. m., en [address], segundo piso, sala de conferencias. Se admiten madres lactantes y bebs en regazo.      Delaney Mandril, [facility]

## 2024-09-13 ENCOUNTER — Encounter: Admitting: Women's Health

## 2024-09-15 ENCOUNTER — Telehealth (HOSPITAL_COMMUNITY): Payer: Self-pay

## 2024-09-15 NOTE — Telephone Encounter (Signed)
 09/15/2024 1720  Name: SETH FRIEDLANDER MRN: [id number] DOB: [birth year]  Reason for Call:  Transition of Care Hospital Discharge Call  Contact Status: Patient Contact Status: Message  Language assistant needed:          Follow-Up Questions:    Van Postnatal Depression Scale:  In the Past 7 Days:    PHQ2-9 Depression Scale:     Discharge Follow-up:    Post-discharge interventions: NA  Signature  Rosaline Deretha PEAK

## 2024-10-10 ENCOUNTER — Ambulatory Visit: Admitting: Women's Health

## 2024-10-19 ENCOUNTER — Encounter: Payer: Self-pay | Admitting: Advanced Practice Midwife

## 2024-10-19 ENCOUNTER — Ambulatory Visit: Admitting: Advanced Practice Midwife

## 2024-10-19 DIAGNOSIS — O872 Hemorrhoids in the puerperium: Secondary | ICD-10-CM | POA: Diagnosis not present

## 2024-10-19 DIAGNOSIS — Z1332 Encounter for screening for maternal depression: Secondary | ICD-10-CM | POA: Diagnosis not present

## 2024-10-19 DIAGNOSIS — R87612 Low grade squamous intraepithelial lesion on cytologic smear of cervix (LGSIL): Secondary | ICD-10-CM

## 2024-10-19 MED ORDER — HYDROCORTISONE (PERIANAL) 2.5 % EX CREA: 1.0000 | TOPICAL_CREAM | Freq: Two times a day (BID) | CUTANEOUS | 1 refills | Status: AC

## 2024-10-19 MED ORDER — JUNEL FE 1/20 1-20 MG-MCG PO TABS: 1.0000 | ORAL_TABLET | Freq: Every day | ORAL | 11 refills | Status: AC

## 2024-10-19 NOTE — Patient Instructions (Signed)
Use the website www.[email or web address].net for helpful postpartum resources!

## 2024-10-19 NOTE — Progress Notes (Signed)
 POSTPARTUM VISIT Patient name: Bailey Palmer MRN [id number]  Date of birth: [birth year] Chief Complaint:   Postpartum Care  History of Present Illness:   Bailey Palmer is a 28 y.o. [phone]  female being seen today for a postpartum visit. She is 6 weeks postpartum following a spontaneous vaginal delivery at 38.4 gestational weeks. IOL: yes, for prologed PROM. Anesthesia: epidural.  Laceration: none.  Complications: tight shoulders; <56min head to body time. Inpatient contraception: no.   Pregnancy uncomplicated. Tobacco use: no. Substance use disorder: no. Last pap smear: May 2025 and results were LSIL w/ HRHPV negative. Next pap smear due: May 2026 Patient's last menstrual period was 10/17/2024 (exact date).  Postpartum course has been uncomplicated. Bleeding flow about like a period. Bowel function is normal. Bladder function is normal. Urinary incontinence? no, fecal incontinence? no Patient is not sexually active. Last sexual activity: prior to birth of baby. Desired contraception: abstinence but wants to start on OCPs. Patient does not know about a pregnancy in the future.  Desired family size is unsure number of children.   Upstream - 10/19/24 1506       Pregnancy Intention Screening   Does the patient want to become pregnant in the next year? No    Does the patient's partner want to become pregnant in the next year? No    Would the patient like to discuss contraceptive options today? Yes      Contraception Wrap Up   Current Method Abstinence    End Method Oral Contraceptive         The pregnancy intention screening data noted above was reviewed. Potential methods of contraception were discussed. The patient elected to proceed with Oral Contraceptive.  Edinburgh Postpartum Depression Screening: negative  Edinburgh Postnatal Depression Scale - 10/19/24 1507       Edinburgh Postnatal Depression Scale:  In the Past 7 Days   I have been able to laugh and see the funny side  of things. 0    I have looked forward with enjoyment to things. 0    I have blamed myself unnecessarily when things went wrong. 2    I have been anxious or worried for no good reason. 1    I have felt scared or panicky for no good reason. 0    Things have been getting on top of me. 0    I have been so unhappy that I have had difficulty sleeping. 0    I have felt sad or miserable. 0    I have been so unhappy that I have been crying. 0    The thought of harming myself has occurred to me. 0    Edinburgh Postnatal Depression Scale Total 3             03/08/2024    8:36 AM 02/03/2023    2:26 PM 04/17/2021    8:48 AM  GAD 7 : Generalized Anxiety Score  Nervous, Anxious, on Edge 0 0 0  Control/stop worrying 0 0 0  Worry too much - different things 0 0 0  Trouble relaxing 0 0 0  Restless 0 0 0  Easily annoyed or irritable 0 0 0  Afraid - awful might happen 0 0 0  Total GAD 7 Score 0 0 0     Baby's course has been uncomplicated. Baby is feeding by bottle. Infant has a pediatrician/family doctor? Yes.  Childcare strategy if returning to work/school: daycare.  Pt has material needs met for her  and baby: Yes.   Review of Systems:   Pertinent items are noted in HPI Denies Abnormal vaginal discharge w/ itching/odor/irritation, headaches, visual changes, shortness of breath, chest pain, abdominal pain, severe nausea/vomiting, or problems with urination or bowel movements. Pertinent History Reviewed:  Reviewed past medical,surgical, obstetrical and family history.  Reviewed problem list, medications and allergies. OB History  Gravida Para Term Preterm AB Living  4 4 4   4   SAB IAB Ectopic Multiple Live Births     0 4    # Outcome Date GA Lbr Len/2nd Weight Sex Type Anes PTL Lv  4 Term 09/07/24 [redacted]w[redacted]d  9 lb 5.9 oz (4.25 kg) F Vag-Spont EPI  LIV  3 Term 07/31/23 [redacted]w[redacted]d  7 lb 11.5 oz (3.5 kg) F Vag-Spont EPI  LIV  2 Term 07/04/18 [redacted]w[redacted]d 11:36 / 00:23 9 lb 1.7 oz (4.13 kg) M Vag-Spont EPI N LIV   1 Term 09/17/16 [redacted]w[redacted]d 13:25 / 00:33 8 lb 2 oz (3.685 kg) F Vag-Spont EPI N LIV   Physical Assessment:   Vitals:   10/19/24 1455  Weight: 194 lb (88 kg)  Height: 5' 3 (1.6 m)  Body mass index is 34.37 kg/m.       Physical Examination:   General appearance: alert, well appearing, and in no distress  Mental status: alert, oriented to person, place, and time  Skin: warm & dry   Cardiovascular: normal heart rate noted   Respiratory: normal respiratory effort, no distress   Breasts: deferred, no complaints   Abdomen: soft, non-tender   Pelvic: examination not indicated. Thin prep pap obtained: No  Rectal: not examined, but reports one hemorrhoid   Extremities: Edema: none         No results found for this or any previous visit (from the past 24 hours).  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  Mood and well being:  If positive depression screen, discussed and plan developed.  If using tobacco we discussed reduction/cessation and risk of relapse If current substance abuse, we discussed and referral to local resources was offered.   2. Infant care and feeding:  If breastfeeding, discussed returning to work, pumping, breastfeeding-associated pain, guidance regarding return to fertility while lactating if not using another method. If needed, patient was provided with a letter to be allowed to pump q 2-3hrs to support lactation in a private location with access to a refrigerator to store breastmilk.   Recommended that all  caregivers be immunized for flu, pertussis and other preventable communicable diseases If pt does not have material needs met for her/baby, referred to local resources for help obtaining these.  3. Sexuality, contraception and birth spacing Provided guidance regarding sexuality, management of dyspareunia, and resumption of intercourse Discussed avoiding interpregnancy interval <58mths and recommended birth spacing of 18 months  4. Sleep and fatigue Discussed coping options for fatigue and sleep disruption Encouraged family/partner/community support of 4 hrs of uninterrupted sleep to help with mood and fatigue  5. Physical recovery  If pt had a  C/S, assessed incisional pain and providing guidance on normal vs prolonged recovery If pt had a laceration, perineal healing and pain reviewed.  If urinary or fecal incontinence, discussed management and referred to PT or uro/gyn if indicated  Patient is safe to resume physical activity. Discussed attainment of healthy weight.  6.  Chronic disease management Discussed pregnancy complications if any, and their implications for future childbearing and long-term maternal health. Review recommendations for prevention of recurrent pregnancy complications, such as 17 hydroxyprogesterone caproate to reduce risk for recurrent PTB not applicable, or aspirin to reduce risk of preeclampsia not applicable. Pt had GDM: no. If yes, 2hr GTT scheduled: not applicable. Reviewed medications and non-pregnant dosing including consideration of whether pt is breastfeeding using a reliable resource such as Lact[employer]: not applicable Referred for f/u w/ PCP or subspecialist providers as indicated: not applicable  7. Health maintenance Mammogram at 27yo or earlier if indicated Pap smears as indicated  Meds:  Meds ordered this encounter  Medications   hydrocortisone (ANUSOL-HC) 2.5 % rectal cream    Sig: Place 1 Application rectally 2 (two) times daily.    Dispense:  30 g     Refill:  1    Supervising Provider:   MARILYNN NEST [[id number]]   norethindrone-ethinyl estradiol -FE (JUNEL FE 1/20) 1-20 MG-MCG tablet    Sig: Take 1 tablet by mouth daily.    Dispense:  28 tablet    Refill:  11    Supervising Provider:   MARILYNN NEST [[id number]]    Follow-up: Return for Needs repeat Pap May 2026.   No orders of the defined types were placed in this encounter.   Suzen JONETTA Gentry CNM 10/19/2024 3:45 PM
